# Patient Record
Sex: Male | Born: 1935 | Hispanic: No | Marital: Married | Smoking: Never smoker
Health system: Southern US, Community
[De-identification: ages and names within clinical notes are randomized; demographics above are authoritative.]

## PROBLEM LIST (undated history)

## (undated) DIAGNOSIS — I10 Essential (primary) hypertension: Secondary | ICD-10-CM

## (undated) DIAGNOSIS — E785 Hyperlipidemia, unspecified: Secondary | ICD-10-CM

## (undated) DIAGNOSIS — F329 Major depressive disorder, single episode, unspecified: Secondary | ICD-10-CM

## (undated) DIAGNOSIS — I2510 Atherosclerotic heart disease of native coronary artery without angina pectoris: Secondary | ICD-10-CM

## (undated) DIAGNOSIS — F32A Depression, unspecified: Secondary | ICD-10-CM

## (undated) DIAGNOSIS — G3184 Mild cognitive impairment, so stated: Secondary | ICD-10-CM

## (undated) DIAGNOSIS — F419 Anxiety disorder, unspecified: Secondary | ICD-10-CM

## (undated) DIAGNOSIS — H3530 Unspecified macular degeneration: Secondary | ICD-10-CM

## (undated) HISTORY — DX: Atherosclerotic heart disease of native coronary artery without angina pectoris: I25.10

## (undated) HISTORY — DX: Hyperlipidemia, unspecified: E78.5

## (undated) HISTORY — DX: Essential (primary) hypertension: I10

## (undated) HISTORY — DX: Anxiety disorder, unspecified: F41.9

## (undated) HISTORY — PX: APPENDECTOMY: SHX54

## (undated) HISTORY — DX: Depression, unspecified: F32.A

## (undated) HISTORY — DX: Major depressive disorder, single episode, unspecified: F32.9

## (undated) HISTORY — DX: Mild cognitive impairment, so stated: G31.84

## (undated) HISTORY — PX: TONSILLECTOMY: SHX5217

## (undated) HISTORY — DX: Unspecified macular degeneration: H35.30

## (undated) HISTORY — PX: CHOLECYSTECTOMY: SHX55

---

## 2007-11-19 ENCOUNTER — Encounter: Payer: Self-pay | Admitting: Cardiology

## 2009-03-16 ENCOUNTER — Encounter: Payer: Self-pay | Admitting: Cardiology

## 2009-03-24 ENCOUNTER — Encounter: Payer: Self-pay | Admitting: Cardiology

## 2009-05-28 ENCOUNTER — Ambulatory Visit: Payer: Self-pay | Admitting: Family Medicine

## 2009-05-28 ENCOUNTER — Encounter: Admission: RE | Admit: 2009-05-28 | Discharge: 2009-05-28 | Payer: Self-pay | Admitting: Family Medicine

## 2009-05-28 DIAGNOSIS — M7500 Adhesive capsulitis of unspecified shoulder: Secondary | ICD-10-CM | POA: Insufficient documentation

## 2009-05-28 DIAGNOSIS — E663 Overweight: Secondary | ICD-10-CM | POA: Insufficient documentation

## 2009-05-28 DIAGNOSIS — I10 Essential (primary) hypertension: Secondary | ICD-10-CM

## 2009-05-28 DIAGNOSIS — I252 Old myocardial infarction: Secondary | ICD-10-CM

## 2009-05-28 DIAGNOSIS — I2510 Atherosclerotic heart disease of native coronary artery without angina pectoris: Secondary | ICD-10-CM

## 2009-05-28 DIAGNOSIS — H3530 Unspecified macular degeneration: Secondary | ICD-10-CM | POA: Insufficient documentation

## 2009-05-28 DIAGNOSIS — F329 Major depressive disorder, single episode, unspecified: Secondary | ICD-10-CM

## 2009-05-28 DIAGNOSIS — E785 Hyperlipidemia, unspecified: Secondary | ICD-10-CM | POA: Insufficient documentation

## 2009-06-01 ENCOUNTER — Telehealth (INDEPENDENT_AMBULATORY_CARE_PROVIDER_SITE_OTHER): Payer: Self-pay | Admitting: *Deleted

## 2009-06-04 ENCOUNTER — Encounter: Payer: Self-pay | Admitting: Family Medicine

## 2009-06-08 ENCOUNTER — Encounter: Payer: Self-pay | Admitting: Family

## 2009-06-08 ENCOUNTER — Telehealth (INDEPENDENT_AMBULATORY_CARE_PROVIDER_SITE_OTHER): Payer: Self-pay | Admitting: *Deleted

## 2009-06-08 ENCOUNTER — Ambulatory Visit: Payer: Self-pay | Admitting: Family Medicine

## 2009-06-08 ENCOUNTER — Telehealth: Payer: Self-pay | Admitting: Family Medicine

## 2009-06-08 ENCOUNTER — Encounter: Payer: Self-pay | Admitting: Cardiology

## 2009-06-08 DIAGNOSIS — R209 Unspecified disturbances of skin sensation: Secondary | ICD-10-CM

## 2009-06-09 ENCOUNTER — Encounter: Payer: Self-pay | Admitting: Cardiology

## 2009-06-09 ENCOUNTER — Encounter: Payer: Self-pay | Admitting: Family Medicine

## 2009-06-09 ENCOUNTER — Encounter: Payer: Self-pay | Admitting: Family

## 2009-06-10 ENCOUNTER — Encounter: Payer: Self-pay | Admitting: Cardiology

## 2009-06-11 ENCOUNTER — Encounter: Payer: Self-pay | Admitting: Family

## 2009-06-11 ENCOUNTER — Encounter: Payer: Self-pay | Admitting: Family Medicine

## 2009-06-11 ENCOUNTER — Encounter: Payer: Self-pay | Admitting: Cardiology

## 2009-06-15 ENCOUNTER — Encounter: Payer: Self-pay | Admitting: Cardiology

## 2009-06-15 ENCOUNTER — Ambulatory Visit: Payer: Self-pay | Admitting: Cardiology

## 2009-06-15 DIAGNOSIS — I6350 Cerebral infarction due to unspecified occlusion or stenosis of unspecified cerebral artery: Secondary | ICD-10-CM | POA: Insufficient documentation

## 2009-06-18 ENCOUNTER — Telehealth (INDEPENDENT_AMBULATORY_CARE_PROVIDER_SITE_OTHER): Payer: Self-pay | Admitting: *Deleted

## 2009-06-24 ENCOUNTER — Ambulatory Visit: Payer: Self-pay | Admitting: Family

## 2009-06-24 DIAGNOSIS — D696 Thrombocytopenia, unspecified: Secondary | ICD-10-CM

## 2009-06-25 ENCOUNTER — Encounter: Payer: Self-pay | Admitting: Family Medicine

## 2009-06-25 LAB — CONVERTED CEMR LAB: Chloride: 103 meq/L (ref 96–112)

## 2009-07-01 ENCOUNTER — Telehealth (INDEPENDENT_AMBULATORY_CARE_PROVIDER_SITE_OTHER): Payer: Self-pay | Admitting: *Deleted

## 2009-07-02 ENCOUNTER — Telehealth (INDEPENDENT_AMBULATORY_CARE_PROVIDER_SITE_OTHER): Payer: Self-pay | Admitting: *Deleted

## 2009-07-02 ENCOUNTER — Ambulatory Visit: Payer: Self-pay | Admitting: Family Medicine

## 2009-07-02 LAB — CONVERTED CEMR LAB
HCT: 40.9 % (ref 39.0–52.0)
Hemoglobin: 14.6 g/dL (ref 13.0–17.0)
Lymphocytes Relative: 27 % (ref 12–46)
Lymphs Abs: 1.7 10*3/uL (ref 0.7–4.0)
MCHC: 35.7 g/dL (ref 30.0–36.0)
MCV: 91.1 fL (ref 78.0–100.0)
Neutro Abs: 4.2 10*3/uL (ref 1.7–7.7)
Neutrophils Relative %: 67 % (ref 43–77)
Platelets: 163 10*3/uL (ref 150–400)
RDW: 13.9 % (ref 11.5–15.5)

## 2009-07-09 ENCOUNTER — Ambulatory Visit: Payer: Self-pay | Admitting: Family Medicine

## 2009-07-09 LAB — CONVERTED CEMR LAB: INR: 1.9

## 2009-07-15 ENCOUNTER — Ambulatory Visit: Payer: Self-pay | Admitting: Family Medicine

## 2009-07-29 ENCOUNTER — Ambulatory Visit: Payer: Self-pay | Admitting: Family Medicine

## 2009-07-29 DIAGNOSIS — E538 Deficiency of other specified B group vitamins: Secondary | ICD-10-CM

## 2009-07-29 DIAGNOSIS — R197 Diarrhea, unspecified: Secondary | ICD-10-CM | POA: Insufficient documentation

## 2009-07-29 LAB — CONVERTED CEMR LAB: INR: 3.3

## 2009-07-30 ENCOUNTER — Encounter: Payer: Self-pay | Admitting: Family Medicine

## 2009-08-02 LAB — CONVERTED CEMR LAB
ALT: 20 units/L (ref 0–53)
AST: 22 units/L (ref 0–37)
Alkaline Phosphatase: 88 units/L (ref 39–117)
BUN: 16 mg/dL (ref 6–23)
Calcium: 8.3 mg/dL — ABNORMAL LOW (ref 8.4–10.5)
Direct LDL: 88 mg/dL
Eosinophils Relative: 2 % (ref 0–5)
Glucose, Bld: 128 mg/dL — ABNORMAL HIGH (ref 70–99)
Lymphocytes Relative: 25 % (ref 12–46)
Lymphs Abs: 1.4 10*3/uL (ref 0.7–4.0)
MCHC: 34 g/dL (ref 30.0–36.0)
Neutro Abs: 3.5 10*3/uL (ref 1.7–7.7)
Neutrophils Relative %: 64 % (ref 43–77)
RBC: 4.6 M/uL (ref 4.22–5.81)
Sodium: 143 meq/L (ref 135–145)
Total Protein: 6.5 g/dL (ref 6.0–8.3)
Vitamin B-12: 448 pg/mL (ref 211–911)

## 2009-08-03 ENCOUNTER — Encounter: Payer: Self-pay | Admitting: Family Medicine

## 2009-08-12 ENCOUNTER — Ambulatory Visit: Payer: Self-pay | Admitting: Family Medicine

## 2009-08-26 ENCOUNTER — Ambulatory Visit: Payer: Self-pay | Admitting: Family Medicine

## 2009-09-15 ENCOUNTER — Encounter: Payer: Self-pay | Admitting: Family Medicine

## 2009-09-16 ENCOUNTER — Ambulatory Visit: Payer: Self-pay | Admitting: Family Medicine

## 2009-09-17 ENCOUNTER — Encounter: Payer: Self-pay | Admitting: Family Medicine

## 2009-09-17 DIAGNOSIS — Q120 Congenital cataract: Secondary | ICD-10-CM

## 2009-09-17 DIAGNOSIS — H35039 Hypertensive retinopathy, unspecified eye: Secondary | ICD-10-CM | POA: Insufficient documentation

## 2009-09-17 DIAGNOSIS — H348392 Tributary (branch) retinal vein occlusion, unspecified eye, stable: Secondary | ICD-10-CM

## 2009-09-27 ENCOUNTER — Encounter: Payer: Self-pay | Admitting: Family Medicine

## 2009-10-05 ENCOUNTER — Encounter: Payer: Self-pay | Admitting: Family Medicine

## 2009-10-11 ENCOUNTER — Telehealth (INDEPENDENT_AMBULATORY_CARE_PROVIDER_SITE_OTHER): Payer: Self-pay | Admitting: *Deleted

## 2009-10-22 ENCOUNTER — Ambulatory Visit: Payer: Self-pay | Admitting: Family Medicine

## 2009-10-22 LAB — CONVERTED CEMR LAB: INR: 1.1

## 2009-10-25 ENCOUNTER — Ambulatory Visit: Payer: Self-pay | Admitting: Family Medicine

## 2009-11-09 ENCOUNTER — Ambulatory Visit: Payer: Self-pay | Admitting: Family Medicine

## 2009-11-09 LAB — CONVERTED CEMR LAB: INR: 1.5

## 2009-11-23 ENCOUNTER — Ambulatory Visit: Payer: Self-pay | Admitting: Family Medicine

## 2009-12-07 ENCOUNTER — Ambulatory Visit: Payer: Self-pay | Admitting: Family Medicine

## 2009-12-07 LAB — CONVERTED CEMR LAB: INR: 2

## 2010-01-06 ENCOUNTER — Ambulatory Visit: Admit: 2010-01-06 | Payer: Self-pay | Admitting: Family Medicine

## 2010-02-01 NOTE — Letter (Signed)
Summary: [facility] Office Visit Note   [facility] Office Visit Note   Imported By: Roderic Ovens 07/15/2009 15:24:19  _____________________________________________________________________  External Attachment:    Type:   Image     Comment:   External Document

## 2010-02-01 NOTE — Assessment & Plan Note (Signed)
Summary: PT and BP check, wants ears cleaned out- jr   Vital Signs:  Patient profile:   77 year old male Height:      66 inches Weight:      206 pounds BMI:     33.37 O2 Sat:      97 % on Room air Pulse rate:   50 / minute BP sitting:   137 / 62  (left arm) Cuff size:   large  Vitals Entered By: Payton Spark CMA (August 12, 2009 10:25 AM)  O2 Flow:  Room air CC: Ear irrigation, INR and BP f/u   CC:  Ear irrigation and INR and BP f/u.  Anticoagulation Management History:      The patient is on coumadin and comes in today for a routine follow up visit.  He is being anticoagulated due to stroke prevention.  Plans are to keep the patient on coumadin for life.  His last INR was 3.3 and today's INR is 1.9.    Current Medications (verified): 1)  Cozaar 100 Mg Tabs (Losartan Potassium) .... Take 1 Tab By Mouth Once Daily 2)  Isosorbide Dinitrate 30 Mg Tabs (Isosorbide Dinitrate) .... Take 1 Tab By Mouth Once Daily 3)  Vit B12 Inj .... Once Monthly 4)  Lexapro 20 Mg Tabs (Escitalopram Oxalate) .... Take 1 Tab By Mouth Two Times A Day 5)  Folic Acid 1 Mg Tabs (Folic Acid) .... Take 1 Tab By Mouth Once Daily 6)  Hydrochlorothiazide 25 Mg Tabs (Hydrochlorothiazide) .... Take One Half  Tablet By Mouth Daily. 7)  Potassium Chloride Cr 10 Meq Cr-Caps (Potassium Chloride) .... Take One Tablet By Mouth Daily 8)  Pravastatin Sodium 40 Mg Tabs (Pravastatin Sodium) .... Take One Tablet By Mouth Daily At Bedtime 9)  Metoprolol Succinate 25 Mg Xr24h-Tab (Metoprolol Succinate) .[name].. 1 Tab By Mouth Daily 10)  Coumadin 5 Mg Tabs (Warfarin Sodium) .... Sunday - 7.5 Mg, Monday - 5 Mg, Tuesday - 7.5 Mg, Wednesday - 7.5 Mg, Thursday - 7.5 Mg, Friday - 5 Mg, Saturday - 7.5 Mg 11)  Coumadin 1 Mg Tabs (Warfarin Sodium) .... Sunday - 7.5 Mg, Monday - 5 Mg, Tuesday - 7.5 Mg, Wednesday - 7.5 Mg, Thursday - 7.5 Mg, Friday - 5 Mg, Saturday - 7.5 Mg  Allergies (verified): No Known Drug Allergies   Complete  Medication List: 1)  Cozaar 100 Mg Tabs (Losartan potassium) .... Take 1 tab by mouth once daily 2)  Isosorbide Dinitrate 30 Mg Tabs (Isosorbide dinitrate) .... Take 1 tab by mouth once daily 3)  Vit B12 Inj  .... Once monthly 4)  Lexapro 20 Mg Tabs (Escitalopram oxalate) .... Take 1 tab by mouth two times a day 5)  Folic Acid 1 Mg Tabs (Folic acid) .... Take 1 tab by mouth once daily 6)  Hydrochlorothiazide 25 Mg Tabs (Hydrochlorothiazide) .... Take one half  tablet by mouth daily. 7)  Potassium Chloride Cr 10 Meq Cr-caps (Potassium chloride) .... Take one tablet by mouth daily 8)  Pravastatin Sodium 40 Mg Tabs (Pravastatin sodium) .... Take one tablet by mouth daily at bedtime 9)  Metoprolol Succinate 25 Mg Xr24h-tab (Metoprolol succinate) .[name].. 1 tab by mouth daily 10)  Coumadin 5 Mg Tabs (Warfarin sodium) .... Sunday - 7.5 mg, monday - 6 mg, tuesday - 7.5 mg, wednesday - 7.5 mg, thursday - 7.5 mg, friday - 6 mg, saturday - 7.5 mg 11)  Coumadin 1 Mg Tabs (Warfarin sodium) .... Sunday - 7.5 mg, monday -  6 mg, tuesday - 7.5 mg, wednesday - 7.5 mg, thursday - 7.5 mg, friday - 6 mg, saturday - 7.5 mg  Other Orders: Fingerstick (19147) Protime (82956OZ)  Anticoagulation Management Assessment/Plan:      The target INR is 2.0-3.0.  He is to have a PT/INR in 2 weeks.  Anticoagulation instructions were given to patient.         Current Anticoagulation Instructions: The patient's dosage of coumadin will be increased.  The new dosage includes:   Coumadin 5 mg tabs and Coumadin 1 mg tabs:  Sunday - 7.5 mg, Monday - 6 mg, Tuesday - 7.5 mg, Wednesday - 7.5 mg, Thursday - 7.5 mg, Friday - 6 mg, Saturday - 7.5 mg.    Repeat PT/INR in 2 weeks.    Laboratory Results   Blood Tests      INR: 1.9   (Normal Range: 0.88-1.12   Therap INR: 2.0-3.5)

## 2010-02-01 NOTE — Assessment & Plan Note (Signed)
Summary: PT check - jr  Nurse Visit   Allergies: No Known Drug Allergies Laboratory Results   Blood Tests   Date/Time Received: 10/22/2009 Date/Time Reported: 10/22/2009   INR: 1.1   (Normal Range: 0.88-1.12   Therap INR: 2.0-3.5)    Orders Added: 1)  Fingerstick [36416] 2)  Protime [[id number]]   Anticoagulation Management History:      The patient is on coumadin and comes in today for a routine follow up visit.  He is being anticoagulated because of stroke prevention.  Plans are to keep the patient on coumadin for life.  His last INR was 2.2 and today's INR is 1.1.    Anticoagulation Management Assessment/Plan:      The target INR is 2.0-3.0.  He is to have a PT/INR in 2 weeks.  Anticoagulation instructions were given to patient.         Current Anticoagulation Instructions: The patient's dosage of coumadin will be increased.  The new dosage includes:   Coumadin 7.5 mg tabs:  Sunday - 7.5 mg, Monday - 7.5 mg, Tuesday - 7.5 mg, Wednesday - 7.5 mg, Thursday - 7.5 mg, Friday - 7.5 mg, Saturday - 7.5 mg.    Repeat PT/INR in 2 weeks.

## 2010-02-01 NOTE — Assessment & Plan Note (Signed)
Summary: NOV adhesive capsulitis/ meds   Vital Signs:  Patient profile:   77 year old male Height:      66 inches Weight:      209 pounds BMI:     33.86 O2 Sat:      97 % on Room air Pulse rate:   66 / minute BP sitting:   182 / 84  (left arm) Cuff size:   regular  Vitals Entered By: Payton Spark CMA (May 28, 2009 10:33 AM)  O2 Flow:  Room air CC: New to est. C/O R arm pain- did have steroid inj x 2 weeks ago but pain is now worse and has less ROM. Also c/o elevated BP x months.    Primary Care Provider:  Seymour Bars DO  CC:  New to est. C/O R arm pain- did have steroid inj x 2 weeks ago but pain is now worse and has less ROM. Also c/o elevated BP x months. .  History of Present Illness: 77 yo WM presents for NOV.  He and his wife just moved here from [place] this wk.  Their daughter is here - Sports administrator.  He has hx of CAD, AMI with 4 stents in his heart, last one placed in 09.  He has HTN, anxiety, depression, high cholesterol.  He saw his doctor in [place] 2 wks ago for 1 month of non traumatic R shoulder pain.  Had an anterior and posterior steroid injection which did help his pain but he has noticed in the last 2 wks, he has lost ROM.  He is R handed and is now havingproblems unpacking boxes, washing his hair and extending R arm overhead.  he does not endorse much pain. Taking Tylenol.  Denies previous R shoulder problems or surgeries.  Pain radiates to the elbow with weakness but no neck pain and no paresthesias.  Also, his wife states that Rohnan's meds are on the moving truck, which is en route to [place] so he has been w/o his meds for a few days now.  Current Medications (verified): 1)  Cozaar 100 Mg Tabs (Losartan Potassium) .... Take 1 Tab By Mouth Once Daily 2)  Isosorbide Dinitrate 30 Mg Tabs (Isosorbide Dinitrate) .... Take 1 Tab By Mouth Once Daily 3)  Toprol Xl 50 Mg Xr24h-Tab (Metoprolol Succinate) .... Take 1 Tab By Mouth Once Daily 4)  Vit B12 Inj .... Once  Monthly 5)  Lexapro 20 Mg Tabs (Escitalopram Oxalate) .... Take 1 Tab By Mouth Two Times A Day 6)  Folic Acid 1 Mg Tabs (Folic Acid) .... Take 1 Tab By Mouth Once Daily 7)  Alprazolam 0.25 Mg Tabs (Alprazolam) .... Take 1 Tab By Mouth At Bedtime 8)  Lasix .... As Needed 9)  Potassium .... As Needed  Allergies (verified): No Known Drug Allergies  Past History:  Past Medical History: CAD (with stenting x 4 in [place]) HTN depression/ anxiety Hyperlipidemia  Past Surgical History: Appendectomy Cholecystectomy PTCA/stent Tonsillectomy  Family History: Mother died at 59, CVA, CAD, HTN, high chol, DM father died at 11 - hit by a car sister died at 74, brain tumor brother died at 16, CVA  Social History: Retired Theme park manager. Married to [place].   Has 3 grown children.  Daughter - Roosvelt Harps is here, 2 others in [place].  Has 12 grandkids. No exercise. Never smoked. 2 ETOH/ DAY.  Review of Systems       no fevers/sweats/weakness, unexplained wt loss/gain, + change in vision, no difficulty  hearing, ringing in ears, no hay fever/allergies,+ CP/discomfort, no palpitations, no breast lump/nipple discharge, no cough/wheeze, no blood in stool,no  N/V/D, no nocturia, no leaking urine, no unusual vag bleeding, no vaginal/penile discharge, + muscle/joint pain, no rash, no new/changing mole, no HA, + memory loss, no anxiety, no sleep problem, + depression, no unexplained lumps, no easy bruising/bleeding, no concern with sexual function   Physical Exam  General:  obese WM here with wife hard of hearing Head:  normocephalic, atraumatic, and male-pattern balding.   Eyes:  pupils equal, pupils round, and pupils reactive to light.   Ears:  no external deformities.   Nose:  no nasal discharge.   Mouth:  pharynx pink and moist.   Neck:  no masses.   Lungs:  normal respiratory effort, no intercostal retractions, no accessory muscle use, and normal breath sounds.   Heart:  RRR with 2/6 systolic  murmur at the LLSB Msk:  crepitus over the R glenohumeral joint with pain and limited active flexion to 90 deg.  full adduction.  Limited with full int/ ext rotation and limited abduction  full C spine ROM grip + 5/5 bilat Pulses:  2+ radial pulses Extremities:  varicose veins bilat LEs Skin:  fleshy lipoma posterior L knee Psych:  good eye contact and flat affect.     Impression & Recommendations:  Problem # 1:  ADHESIVE CAPSULITIS, RIGHT (ICD-726.0) Assessment Deteriorated Poor R glenohumeral flexion to 90 deg only.  Will get him into PT to try to improve ROM.  He may need stronger pain meds and really cannot take NSAIDs due to ASA use and CAD hx.     Orders: Physical Therapy Referral (PT) T-DG Shoulder*R* ([id number])  Problem # 2:  ESSENTIAL HYPERTENSION, BENIGN (ICD-401.1) Assessment: Deteriorated  Restart meds. Will need to add Caduet back on board at 4 wk f/u. His updated medication list for this problem includes:    Cozaar 100 Mg Tabs (Losartan potassium) .[name]... Take 1 tab by mouth once daily    Toprol Xl 50 Mg Xr24h-tab (Metoprolol succinate) .[name]... Take 1 tab by mouth once daily  BP today: 182/84  Orders: Prescription Created Electronically [phone])  Problem # 3:  CAD (ICD-414.00) Hx of non - adherence with meds and has been having anginal chest pains for years.   I RFd his isosorbide today in additon to his BP meds.  Will get his old records and I will go ahead and refer him to Dr Jens Som to est care here.  His last stent was placed in 09. His updated medication list for this problem includes:    Cozaar 100 Mg Tabs (Losartan potassium) .[name]... Take 1 tab by mouth once daily    Isosorbide Dinitrate 30 Mg Tabs (Isosorbide dinitrate) .[name]... Take 1 tab by mouth once daily    Toprol Xl 50 Mg Xr24h-tab (Metoprolol succinate) .[name]... Take 1 tab by mouth once daily  Orders: Cardiology Referral (Cardiology)  Problem # 4:  DEPRESSION (ICD-311) RFd his meds.  His wife reports that  the move has him overwhelmed and emotional.  He has also been w/o his meds for almost a wk now. His updated medication list for this problem includes:    Lexapro 20 Mg Tabs (Escitalopram oxalate) .[name]... Take 1 tab by mouth two times a day    Alprazolam 0.25 Mg Tabs (Alprazolam) .[name]... Take 1 tab by mouth at bedtime  Problem # 5:  MACULAR DEGENERATION (ICD-362.50) Will refer to [facility] for f/u. Orders: Ophthalmology Referral (  Ophthalmology)  Complete Medication List: 1)  Cozaar 100 Mg Tabs (Losartan potassium) .... Take 1 tab by mouth once daily 2)  Isosorbide Dinitrate 30 Mg Tabs (Isosorbide dinitrate) .... Take 1 tab by mouth once daily 3)  Toprol Xl 50 Mg Xr24h-tab (Metoprolol succinate) .... Take 1 tab by mouth once daily 4)  Vit B12 Inj  .... Once monthly 5)  Lexapro 20 Mg Tabs (Escitalopram oxalate) .... Take 1 tab by mouth two times a day 6)  Folic Acid 1 Mg Tabs (Folic acid) .... Take 1 tab by mouth once daily 7)  Alprazolam 0.25 Mg Tabs (Alprazolam) .... Take 1 tab by mouth at bedtime 8)  Lasix  .... As needed 9)  Potassium  .... As needed 10)  Caduet *unsure of Dose  .... Take 1 tab by mouth once daily  Patient Instructions: 1)  Have R shoulder Xray done downstairs. 2)  Will call you w/ results this afternoon. 3)  Set up for PT. 4)  Use Tylenol 2 tabs 3 x a day as needed for pain. 5)  Restart BP/ heart meds! 6)  Return for f/u BP / shoulder pain in 3-4 wks. Prescriptions: ALPRAZOLAM 0.25 MG TABS (ALPRAZOLAM) Take 1 tab by mouth at bedtime  #30 x 0   Entered and Authorized by:   Seymour Bars DO   Signed by:   Seymour Bars DO on 05/28/2009   Method used:   Printed then faxed to ...       [address] [phone]* (retail)       [address] [place], [place]  [address]       Ph: [phone]       Fax: [phone]   RxID:   [id number] LEXAPRO 20 MG TABS (ESCITALOPRAM OXALATE) Take 1 tab by mouth two times a day  #30 x 2   Entered and Authorized by:    Seymour Bars DO   Signed by:   Seymour Bars DO on 05/28/2009   Method used:   Printed then faxed to ...       [address] [phone]* (retail)       [address] [place], [place]  [address]       Ph: [phone]       Fax: [phone]   RxID:   [id number] TOPROL XL 50 MG XR24H-TAB (METOPROLOL SUCCINATE) Take 1 tab by mouth once daily  #30 x 2   Entered and Authorized by:   Seymour Bars DO   Signed by:   Seymour Bars DO on 05/28/2009   Method used:   Electronically to        [facility] [phone]* (retail)       [address] [place], [place]  [address]       Ph: [phone]       Fax: [phone]   RxID:   [id number] ISOSORBIDE DINITRATE 30 MG TABS (ISOSORBIDE DINITRATE) Take 1 tab by mouth once daily  #30 x 2   Entered and Authorized by:   Seymour Bars DO   Signed by:   Seymour Bars DO on 05/28/2009   Method used:   Electronically to        [facility] [phone]* (retail)       [address] [place], [place]  [address]  Ph: [phone]       Fax: [phone]   RxID:   [id number] COZAAR 100 MG TABS (LOSARTAN POTASSIUM) Take 1 tab by mouth once daily  #30 x 2   Entered and Authorized by:   Seymour Bars DO   Signed by:   Seymour Bars DO on 05/28/2009   Method used:   Electronically to        [facility] [phone]* (retail)       [address] [place], [place]  [address]       Ph: [phone]       Fax: [phone]   RxID:   [id number]

## 2010-02-01 NOTE — Progress Notes (Signed)
Summary: Scheduled pt as Dr.Bowen instructed  Phone Note Outgoing Call   Call placed by: Michaelle Copas, [facility]  Call placed to: Patient Summary of Call: Called and spoke to pt's wife and she is going to bring him in to see Dr.Bowen for a 1:00 Appt.Michaelle Copas  June 08, 2009 11:18 AM  Initial call taken by: Michaelle Copas,  June 08, 2009 11:18 AM

## 2010-02-01 NOTE — Letter (Signed)
Summary: Karena Addison MD Ophthalmology  Karena Addison MD Ophthalmology   Imported By: Lanelle Bal 10/27/2009 14:15:05  _____________________________________________________________________  External Attachment:    Type:   Image     Comment:   External Document

## 2010-02-01 NOTE — Consult Note (Signed)
Summary: [facility]  [facility]   Imported By: Lanelle Bal 06/11/2009 14:04:31  _____________________________________________________________________  External Attachment:    Type:   Image     Comment:   External Document

## 2010-02-01 NOTE — Letter (Signed)
Summary: [facility]  [facility]   Imported By: Lanelle Bal 07/02/2009 09:04:22  _____________________________________________________________________  External Attachment:    Type:   Image     Comment:   External Document

## 2010-02-01 NOTE — Letter (Signed)
Summary: [facility]  [facility]   Imported By: Lanelle Bal 07/07/2009 10:04:19  _____________________________________________________________________  External Attachment:    Type:   Image     Comment:   External Document

## 2010-02-01 NOTE — Miscellaneous (Signed)
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 Occured 10-2008.  Has recieved at least 5 Avastin injections since then.  Has cystic edema increased compared to 1 yr ago.  Referred to Dr Delton See.   Assessed HYPERTENSIVE RETINOPATHY as comment only - bilat.  mild to moderate with AV nicking and Arteriolar attenuation.   Hx of BRVO R eye.  Working on BP control.  Observations: Added new observation of PRIMARY MD: Seymour Bars DO (09/17/2009 13:42)       Impression & Recommendations:  Problem # 1:  BRANCH RETINAL VEIN OCCLUSION (ICD-362.36) Seeing Dr Pecola Leisure. Occured 10-2008.  Has recieved at least 5 Avastin injections since then.  Has cystic edema increased compared to 1 yr ago.  Referred to Dr Delton See.    Problem # 2:  HYPERTENSIVE RETINOPATHY (ICD-362.11) bilat.  mild to moderate with AV nicking and Arteriolar attenuation.   Hx of BRVO R eye.  Working on BP control.    Complete Medication List: 1)  Cozaar 100 Mg Tabs (Losartan potassium) .... Take 1 tab by mouth once daily 2)  Isosorbide Dinitrate 30 Mg Tabs (Isosorbide dinitrate) .... Take 1 tab by mouth once daily 3)  Vit B12 Inj  .... Once monthly 4)  Lexapro 20 Mg Tabs (Escitalopram oxalate) .... Take 1 tab by mouth two times a day 5)  Folic Acid 1 Mg Tabs (Folic acid) .... Take 1 tab by mouth once daily 6)  Hydrochlorothiazide 25 Mg Tabs (Hydrochlorothiazide) .... Take one half  tablet by mouth daily. 7)  Potassium Chloride Cr 10 Meq Cr-caps (Potassium chloride) .... Take one tablet by mouth daily 8)  Pravastatin Sodium 40 Mg Tabs (Pravastatin sodium) .... Take one tablet by mouth daily at bedtime 9)  Metoprolol Succinate 25 Mg Xr24h-tab (Metoprolol  succinate) .[name].. 1 tab by mouth daily 10)  Coumadin 6 Mg Tabs (Warfarin sodium) .... Sunday - 7.5 mg, monday - 6 mg, tuesday - 7.5 mg, wednesday - 7.5 mg, thursday - 7.5 mg, friday - 6 mg, saturday - 7.5 mg 11)  Coumadin 1 Mg Tabs (Warfarin sodium) .... Sunday - 7.5 mg, monday - 6 mg, tuesday - 7.5 mg, wednesday - 7.5 mg, thursday - 7.5 mg, friday - 6 mg, saturday - 7.5 mg

## 2010-02-01 NOTE — Consult Note (Signed)
Summary: [facility]   [facility]   Imported By: Roderic Ovens 08/17/2009 10:47:22  _____________________________________________________________________  External Attachment:    Type:   Image     Comment:   External Document

## 2010-02-01 NOTE — Miscellaneous (Signed)
Summary: [facility]  [facility]   Imported By: Lanelle Bal 08/24/2009 12:04:04  _____________________________________________________________________  External Attachment:    Type:   Image     Comment:   External Document

## 2010-02-01 NOTE — Assessment & Plan Note (Signed)
Summary: f/u CVA   Vital Signs:  Patient profile:   77 year old male Height:      66 inches Weight:      211 pounds BMI:     34.18 O2 Sat:      98 % on Room air Pulse rate:   55 / minute BP sitting:   161 / 74  (left arm) Cuff size:   regular  Vitals Entered By: Payton Spark CMA (July 29, 2009 10:35 AM)  O2 Flow:  Room air CC: F/U INR. Has tingling in R hand and face feels swollen around mouth since stroke   Primary Care Provider:  Seymour Bars DO  CC:  F/U INR. Has tingling in R hand and face feels swollen around mouth since stroke.  History of Present Illness: 76 yo WM presents for f/u from a CVA in June.  This was his preceeding note:   Carlos Harrington is a 77 year old male who presents today for follow up from his most recent hospitalization. 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 MRA of the brain revealed atherosclerotic disease which included occlusion of the left PCA and R MCA as well as basilar arter.  He was also noted to have mild glucose intolerance.  (A1C 6.1) During this hospitalization he was placed on coumadin therapy which according to the patient's wife was finally therapeutic yesterday at 2.3  His lovenox bridge was  discontinued.  In addition, HCTZ was added to his regimen due to uncontrolled BP while hospitalized.  Since returning home, he has been doing well.  The only residual effects that he has noticed is some tingling in the right hand.  Per wife patient has been more tired than usual- though this is slowly improving.    Since then, he is doing well on his coumadin but is having frequent loose stools.  He continues to have numbness with loss of dexterity in his R hand (he is R hand dominant) and tingling over the R side of his lips and cheeks but no problems swallowing or speaking.  He is taking 1/2 tab of HCTZ since last.   Anticoagulation Management History:      The patient is on coumadin and comes in  today for a routine follow up visit.  He is being anticoagulated due to stroke prevention.  Plans are to keep the patient on coumadin for life.  His last INR was 3.1 and today's INR is 3.3.    Current Medications (verified): 1)  Cozaar 100 Mg Tabs (Losartan Potassium) .... Take 1 Tab By Mouth Once Daily 2)  Isosorbide Dinitrate 30 Mg Tabs (Isosorbide Dinitrate) .... Take 1 Tab By Mouth Once Daily 3)  Vit B12 Inj .... Once Monthly 4)  Lexapro 20 Mg Tabs (Escitalopram Oxalate) .... Take 1 Tab By Mouth Two Times A Day 5)  Folic Acid 1 Mg Tabs (Folic Acid) .... Take 1 Tab By Mouth Once Daily 6)  Hydrochlorothiazide 25 Mg Tabs (Hydrochlorothiazide) .... Take One Half  Tablet By Mouth Daily. 7)  Potassium Chloride Cr 10 Meq Cr-Caps (Potassium Chloride) .... Take One Tablet By Mouth Daily 8)  Pravastatin Sodium 40 Mg Tabs (Pravastatin Sodium) .... Take One Tablet By Mouth Daily At Bedtime 9)  Metoprolol Succinate 25 Mg Xr24h-Tab (Metoprolol Succinate) .[name].. 1 Tab By Mouth Daily 10)  Coumadin 7.5 Mg Tabs (Warfarin Sodium) .... Sunday - 7.5 Mg, Monday - 7.5 Mg, Tuesday - 7.5  Mg, Wednesday - 7.5 Mg, Thursday - 7.5 Mg, Friday - 7.5 Mg, Saturday - 7.5 Mg  Allergies (verified): No Known Drug Allergies  Past History:  Past Medical History: CAD (with stenting x 4 in [place]) HTN depression/ anxiety Hyperlipidemia MACULAR DEGENERATION  CVA L thalamic/ L occiptal  Past Surgical History: Reviewed history from 06/15/2009 and no changes required. Appendectomy Cholecystectomy Tonsillectomy  Family History: Reviewed history from 06/15/2009 and no changes required. Mother died at 66, CVA,  HTN, high chol, DM father died at 48 - hit by a car sister died at 33, brain tumor brother died at 72, CVA  Social History: Reviewed history from 06/15/2009 and no changes required. 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   Has 3 grown children.  Daughter - Carlos Harrington is here, 2 others in [place].  Has 12  grandkids. No exercise. Never smoked. 2 ETOH/ DAy.  Review of Systems      See HPI  Physical Exam  General:  alert, well-developed, well-nourished, and well-hydrated.  obese Head:  normocephalic and atraumatic.   Eyes:  pupils equal, pupils round, and pupils reactive to light.   Ears:  R sided hearing aid Nose:  no nasal discharge.   Mouth:  good dentition and pharynx pink and moist.  able to swallow on command Neck:  no masses.  no audible carotid bruits Lungs:  Normal respiratory effort, chest expands symmetrically. Lungs are clear to auscultation, no crackles or wheezes. Heart:  Normal rate and regular rhythm. S1 and S2 normal without gallop, murmur, click, rub or other extra sounds. Abdomen:  soft and non-tender.   Pulses:  2+ radial pulses Extremities:  no LE edema Neurologic:  no tremor subjective numbness over the R thumb and 4th digit  cranial nerves II-XII intact.   Skin:  color normal.   Cervical Nodes:  No lymphadenopathy noted Psych:  Oriented X3, good eye contact, not anxious appearing, and not depressed appearing.     Impression & Recommendations:  Problem # 1:  CVA (ICD-434.91) Unchanged.  On coumadin, BB, ACEi, statin. INR 3.3 today.  Will adjust his dose down and recheck in 2 wks. Will set him up with neuro for ongoing weakness and high risk for recurrence.  REfer to OT to improve R hand dexterity. The following medications were removed from the medication list:    Coumadin 7.5 Mg Tabs (Warfarin sodium) ..... Sunday - 7.5 mg, monday - 7.5 mg, tuesday - 7.5 mg, wednesday - 7.5 mg, thursday - 7.5 mg, friday - 7.5 mg, saturday - 7.5 mg His updated medication list for this problem includes:    Coumadin 5 Mg Tabs (Warfarin sodium) ..... Sunday - 7.5 mg, monday - 5 mg, tuesday - 7.5 mg, wednesday - 7.5 mg, thursday - 7.5 mg, friday - 5 mg, saturday - 7.5 mg    Coumadin 1 Mg Tabs (Warfarin sodium) ..... Sunday - 7.5 mg, monday - 5 mg, tuesday - 7.5 mg, wednesday -  7.5 mg, thursday - 7.5 mg, friday - 5 mg, saturday - 7.5 mg  Orders: Neurology Referral (Neuro) Occupational Therapy (OT) T-Lipoprotein (LDL cholesterol)  ([id number])  Problem # 2:  ESSENTIAL HYPERTENSION, BENIGN (ICD-401.1) BP still high.  Will increase HCTZ back up to a full tab.  REcheck BMP at 3 mos f/u. His updated medication list for this problem includes:    Cozaar 100 Mg Tabs (Losartan potassium) .[name]... Take 1 tab by mouth once daily    Hydrochlorothiazide 25 Mg Tabs (Hydrochlorothiazide) .[name]... Take  one half  tablet by mouth daily.    Metoprolol Succinate 25 Mg Xr24h-tab (Metoprolol succinate) .[name]... 1 tab by mouth daily  BP today: 161/74 Prior BP: 143/60 (07/02/2009)  Labs Reviewed: K+: 3.7 (06/25/2009) Creat: : 1.30 (06/25/2009)     Problem # 3:  DIARRHEA (ICD-787.91) Possibly med- induced.  All of his meds are necessary though.  Will check some labs today.  may need stool studies and a colonoscopy if not resolving. Orders: T-Comprehensive Metabolic Panel [phone]) T-CBC w/Diff [id number]) T-TSH ([phone])  Problem # 4:  HYPERLIPIDEMIA (ICD-272.4) LDL goal with CAD and CVA is <70.  Will check this today. His updated medication list for this problem includes:    Pravastatin Sodium 40 Mg Tabs (Pravastatin sodium) .[name]... Take one tablet by mouth daily at bedtime  Problem # 5:  B12 DEFICIENCY (ICD-266.2) He had previously been on B12 injections and has not had a level checked since moving here.  Will check today. Orders: T-Vitamin B12 ([id number])  Problem # 6:  GLUCOSE INTOLERANCE (ICD-271.3) Assessment: New He has been given info on diabetic diet and remain in the IFG range.  Working on low carb diet, limited exercise. Recheck at 3 mos f/u visit.  Complete Medication List: 1)  Cozaar 100 Mg Tabs (Losartan potassium) .... Take 1 tab by mouth once daily 2)  Isosorbide Dinitrate 30 Mg Tabs (Isosorbide dinitrate) .... Take 1 tab by mouth once daily 3)  Vit  B12 Inj  .... Once monthly 4)  Lexapro 20 Mg Tabs (Escitalopram oxalate) .... Take 1 tab by mouth two times a day 5)  Folic Acid 1 Mg Tabs (Folic acid) .... Take 1 tab by mouth once daily 6)  Hydrochlorothiazide 25 Mg Tabs (Hydrochlorothiazide) .... Take one half  tablet by mouth daily. 7)  Potassium Chloride Cr 10 Meq Cr-caps (Potassium chloride) .... Take one tablet by mouth daily 8)  Pravastatin Sodium 40 Mg Tabs (Pravastatin sodium) .... Take one tablet by mouth daily at bedtime 9)  Metoprolol Succinate 25 Mg Xr24h-tab (Metoprolol succinate) .[name].. 1 tab by mouth daily 10)  Coumadin 5 Mg Tabs (Warfarin sodium) .... Sunday - 7.5 mg, monday - 5 mg, tuesday - 7.5 mg, wednesday - 7.5 mg, thursday - 7.5 mg, friday - 5 mg, saturday - 7.5 mg 11)  Coumadin 1 Mg Tabs (Warfarin sodium) .... Sunday - 7.5 mg, monday - 5 mg, tuesday - 7.5 mg, wednesday - 7.5 mg, thursday - 7.5 mg, friday - 5 mg, saturday - 7.5 mg  Anticoagulation Management Assessment/Plan:      The target INR is 2.0-3.0.  He is to have a PT/INR in 2 weeks.  Anticoagulation instructions were given to patient.         Current Anticoagulation Instructions: Hold dose tonight.  Coumadin 5 mg tabs and Coumadin 1 mg tabs:  Sunday - 7.5 mg, Monday - 5 mg, Tuesday - 7.5 mg, Wednesday - 7.5 mg, Thursday - 7.5 mg, Friday - 5 mg, Saturday - 7.5 mg.    Repeat PT/INR in 2 weeks.    Patient Instructions: 1)  Labs today downstairs. 2)  Will call you w/ results tomorrow. 3)  Adjust coumadin dose down. 4)  Recheck in 2 wks. 5)  Occ Health and Neurology referrals made. 6)  Return to recheck fasting sugar/ BP/ cholesterol in 3 mos.

## 2010-02-01 NOTE — Assessment & Plan Note (Signed)
Summary: coumadin check - jr  Nurse Visit   Vitals Entered By: Payton Spark CMA (September 16, 2009 10:16 AM)  Allergies: No Known Drug Allergies Laboratory Results   Blood Tests      INR: 2.2   (Normal Range: 0.88-1.12   Therap INR: 2.0-3.5)    Orders Added: 1)  Fingerstick [36416] 2)  Protime [[id number]]   Anticoagulation Management History:      The patient is on coumadin and comes in today for a routine follow up visit.  He is being anticoagulated due to stroke prevention.  Plans are to keep the patient on coumadin for life.  His last INR was 2.0 and today's INR is 2.2.    Anticoagulation Management Assessment/Plan:      The target INR is 2.0-3.0.  He is to have a PT/INR in 4 weeks.  Anticoagulation instructions were given to patient.         Current Anticoagulation Instructions: The patient is to continue with the same dose of coumadin.  This dosage includes: Coumadin 6 mg tabs and Coumadin 1 mg tabs:  Sunday - 7.5 mg, Monday - 6 mg, Tuesday - 7.5 mg, Wednesday - 7.5 mg, Thursday - 7.5 mg, Friday - 6 mg, Saturday - 7.5 mg.    Repeat PT/INR in 4 weeks.

## 2010-02-01 NOTE — Assessment & Plan Note (Signed)
Summary: nurse appt- jr  Nurse Visit   Vitals Entered By: Payton Spark CMA (July 15, 2009 11:05 AM)  Allergies: No Known Drug Allergies Laboratory Results   Blood Tests      INR: 3.1   (Normal Range: 0.88-1.12   Therap INR: 2.0-3.5)    Orders Added: 1)  Fingerstick [36416] 2)  Protime [[id number]]   Anticoagulation Management History:      The patient is on coumadin and comes in today for a routine follow up visit.  He is being anticoagulated due to stroke prevention.  Plans are to keep the patient on coumadin for life.  His last INR was 1.9 and today's INR is 3.1.    Anticoagulation Management Assessment/Plan:      The target INR is 2.0-3.0.  He is to have a PT/INR in 2 weeks.  Anticoagulation instructions were given to patient.         Current Anticoagulation Instructions: The patient's dosage of coumadin will be decreased.  The new dosage includes:   Coumadin 7.5 mg tabs:  Sunday - 7.5 mg, Monday - 7.5 mg, Tuesday - 7.5 mg, Wednesday - 7.5 mg, Thursday - 7.5 mg, Friday - 7.5 mg, Saturday - 7.5 mg.    Repeat PT/INR in 2 weeks.

## 2010-02-01 NOTE — Progress Notes (Signed)
  ROI faxed to Dr.Spannuolo @ [phone]/cb [phone] recieved records back today put on Gesila's desk. [facility]  July 01, 2009 10:12 AM

## 2010-02-01 NOTE — Assessment & Plan Note (Signed)
Summary: coumadin check - jr  Nurse Visit   Vitals Entered By: Payton Spark CMA (November 23, 2009 1:09 PM)  Allergies: No Known Drug Allergies Laboratory Results   Blood Tests      INR: 1.8   (Normal Range: 0.88-1.12   Therap INR: 2.0-3.5)    Orders Added: 1)  Fingerstick [36416] 2)  Protime [[id number]]   Anticoagulation Management History:      The patient is on coumadin and comes in today for a routine follow up visit.  Coumadin therapy is being given due to stroke prevention.  Plans are to keep the patient on coumadin for life.  His last INR was 1.5 and today's INR is 1.8.    Anticoagulation Management Assessment/Plan:      The target INR is 2.0-3.0.  He is to have a PT/INR in 2 weeks.  Anticoagulation instructions were given to patient.         Current Anticoagulation Instructions: The patient's dosage of coumadin will be increased.  The new dosage includes:   Coumadin 7.5 mg tabs and Coumadin 1 mg tabs:  Sunday - 8.5 mg, Monday - 8.5 mg, Tuesday - 7.5 mg, Wednesday - 8.5 mg, Thursday - 7.5 mg, Friday - 8.5 mg, Saturday - 8.5 mg.    Repeat PT/INR in 2 weeks.

## 2010-02-01 NOTE — Assessment & Plan Note (Signed)
Summary: HOSPITAL FU//VGJ   Vital Signs:  Patient profile:   77 year old male Height:      66 inches Weight:      206 pounds BMI:     33.37 O2 Sat:      95 % on Room air Pulse rate:   61 / minute BP sitting:   109 / 62  (left arm) Cuff size:   large  Vitals Entered By: Payton Spark CMA (June 24, 2009 2:57 PM)  O2 Flow:  Room air CC: Hosp F/U   Primary Care Provider:  Seymour Bars DO  CC:  Hosp F/U.  History of Present Illness: Carlos Harrington is a 77 year old male who presents today for follow up from his most recent hospitalization. He was hospitalized 6/7-6/10 at [facility] for a Left thalamic CVA and L occipital CVA. MRA of the brain revealed atherosclerotic disease which included occlusion of the left PCA and R MCA as well as basilar arter.  He was also noted to have mild glucose intolerance.  (A1C 6.1) During this hospitalization he was placed on coumadin therapy which according to the patient's wife was finally therapeutic yesterday at 2.3  His lovenox bridge was  discontinued.  In addition, HCTZ was added to his regimen due to uncontrolled BP while hospitalized.  Since returning home, he has been doing well.  The only residual effects that he has noticed is some tingling in the right hand.  Per wife patient has been more tired than usual- though this is slowly improving.    Current Medications (verified): 1)  Cozaar 100 Mg Tabs (Losartan Potassium) .... Take 1 Tab By Mouth Once Daily 2)  Isosorbide Dinitrate 30 Mg Tabs (Isosorbide Dinitrate) .... Take 1 Tab By Mouth Once Daily 3)  Vit B12 Inj .... Once Monthly 4)  Lexapro 20 Mg Tabs (Escitalopram Oxalate) .... Take 1 Tab By Mouth Two Times A Day 5)  Folic Acid 1 Mg Tabs (Folic Acid) .... Take 1 Tab By Mouth Once Daily 6)  Hydrochlorothiazide 25 Mg Tabs (Hydrochlorothiazide) .... Take One Tablet By Mouth Daily. 7)  Potassium Chloride Cr 10 Meq Cr-Caps (Potassium Chloride) .... Take One Tablet By Mouth Daily 8)   Warfarin Sodium 5 Mg Tabs (Warfarin Sodium) .... Use As Directed By Anticoagulation Clinic 9)  Pravastatin Sodium 40 Mg Tabs (Pravastatin Sodium) .... Take One Tablet By Mouth Daily At Bedtime 10)  Metoprolol Succinate 25 Mg Xr24h-Tab (Metoprolol Succinate) .... 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   Has 3 grown children.  Daughter - Ro[place]velt Harps is here, 2 others in [place].  Has 12 grandkids. No exercise. Never smoked. 2 ETOH/ DAy.  Review of Systems       + tingling in his right hand.  Denies facial paresthesias  Physical Exam  General:  Well-developed,well-nourished,in no acute distress; alert,appropriate and cooperative throughout examination Lungs:  Normal respiratory effort, chest expands symmetrically. Lungs are clear to auscultation, no crackles or  wheezes. Heart:  Normal rate and regular rhythm. S1 and S2 normal without gallop, murmur, click, rub or other extra sounds. Neurologic:  No cranial nerve deficits noted. Station and gait are normal.  Sensory, motor and coordinative functions appear intact except patient is somewhat hard of hearing   Impression & Recommendations:  Problem # 1:  CVA (ICD-434.91) Hospital records reviewed. Patient is now maintained on Warfarin. Clinically stable Family and patient wish to have coumadin monitored at [facility].  Plan follow up in 1 week for INR check, and 1 month f/u with Dr.  Cathey Endow.  Continue statin His updated medication list for this problem includes:    Warfarin Sodium 5 Mg Tabs (Warfarin sodium) ..... Use as directed by anticoagulation clinic  Problem # 2:  ESSENTIAL HYPERTENSION, BENIGN (ICD-401.1) Assessment: Comment Only This appears overtreated.  Will cut HCTZ done to 12.5 and plan to have his BP checked at his nurse visit next week. His updated medication list for this problem includes:    Cozaar 100 Mg Tabs (Losartan potassium) .[name]... Take 1 tab by mouth once daily    Hydrochlorothiazide 25 Mg Tabs (Hydrochlorothiazide) .[name]... Take one half  tablet by mouth daily.    Metoprolol Succinate 25 Mg Xr24h-tab (Metoprolol succinate) .[name]... Take one half  tablet by mouth daily  Orders: T-Basic Metabolic Panel [id number])  Problem # 3:  GLUCOSE INTOLERANCE (ICD-271.3) Assessment: New A1C in hospital was 6.1.  Patient was counseled on diabetic diet, weight loss and exercise.    Problem # 4:  THROMBOCYTOPENIA (ICD-287.5) Patient was noted to have Plt cout of 144 in hospital- will need to have CBC drawn next week when he comes in.  Should be better off of heparin. Orders: T-CBC w/Diff ([id number])  Complete Medication List: 1)  Cozaar 100 Mg Tabs (Losartan potassium) .... Take 1 tab by mouth once daily 2)  Isosorbide Dinitrate 30 Mg Tabs (Isosorbide dinitrate) .... Take 1 tab by mouth once daily 3)  Vit B12 Inj  .... Once monthly 4)  Lexapro 20 Mg Tabs (Escitalopram oxalate) .... Take 1 tab by mouth two times a day 5)  Folic Acid 1 Mg Tabs (Folic acid) .... Take 1 tab by mouth once daily 6)  Hydrochlorothiazide 25 Mg Tabs (Hydrochlorothiazide) .... Take one half  tablet by mouth daily. 7)  Potassium Chloride Cr 10 Meq Cr-caps (Potassium chloride) .... Take one tablet by mouth daily 8)  Warfarin Sodium 5 Mg Tabs (Warfarin sodium) .... Use as directed by anticoagulation clinic 9)  Pravastatin Sodium 40 Mg Tabs (Pravastatin sodium) .... Take one tablet by  mouth daily at bedtime 10)  Metoprolol Succinate 25 Mg Xr24h-tab (Metoprolol succinate) .... Take one half  tablet by mouth daily  Patient Instructions: 1)  Please cut your Hydrochlorothiazide in half.  2)  Please arrange a follow up appointment next Thursday for nurse visit and coumadin check and follow up of your blood pressure. 3)  Follow up with Dr. Linford Arnold in  month. 4)  Work hard on a diabetic diet, weight loss and exercise.

## 2010-02-01 NOTE — Assessment & Plan Note (Signed)
Summary: possible CVA   Vital Signs:  Patient profile:   77 year old male Height:      66 inches Weight:      211 pounds BMI:     34.18 O2 Sat:      96 % on Room air Temp:     98.3 degrees F oral Pulse rate:   69 / minute BP sitting:   177 / 81  (left arm) Cuff size:   large  Vitals Entered By: Payton Spark CMA (June 08, 2009 1:18 PM)  O2 Flow:  Room air CC: R side of mouth swollen and numb. R hand numb x this AM.    Primary Care Provider:  Seymour Bars DO  CC:  R side of mouth swollen and numb. R hand numb x this AM. .  History of Present Illness: 77 yo WM presents for swelling on the R side of his face that he woke up with this morning.  The R side of his face felt numb but he has not had problems with talking, swallowing, confusion or problems with movement of the R side of his face.  He has not had any recent eye or face injection and he is on no new meds.  He has risk factors for stroke including HTN, obesity, CAD and hypercholesterolemia..  The R hand also felt numb at the same time over the 1st and 2nd digit but no weakness or pain.  Denies any facial pain or HA.      Current Medications (verified): 1)  Cozaar 100 Mg Tabs (Losartan Potassium) .... Take 1 Tab By Mouth Once Daily 2)  Isosorbide Dinitrate 30 Mg Tabs (Isosorbide Dinitrate) .... Take 1 Tab By Mouth Once Daily 3)  Toprol Xl 50 Mg Xr24h-Tab (Metoprolol Succinate) .... Take 1 Tab By Mouth Once Daily 4)  Vit B12 Inj .... Once Monthly 5)  Lexapro 20 Mg Tabs (Escitalopram Oxalate) .... Take 1 Tab By Mouth Two Times A Day 6)  Folic Acid 1 Mg Tabs (Folic Acid) .... Take 1 Tab By Mouth Once Daily 7)  Alprazolam 0.25 Mg Tabs (Alprazolam) .... Take 1 Tab By Mouth At Bedtime 8)  Lasix .... As Needed 9)  Potassium .... As Needed 10)  Caduet *unsure of Dose .... Take 1 Tab By Mouth Once Daily  Allergies (verified): No Known Drug Allergies  Past History:  Past Medical History: Reviewed history from 05/28/2009 and  no changes required. CAD (with stenting x 4 in [place]) HTN depression/ anxiety Hyperlipidemia  Social History: Reviewed history from 05/28/2009 and no changes required. Retired [employer] manager. Married to [place].   Has 3 grown children.  Daughter - Roosvelt Harps is here, 2 others in [place].  Has 12 grandkids. No exercise. Never smoked. 2 ETOH/ DAY.  Review of Systems      See HPI  Physical Exam  General:  alert and well-developed.  obese, in NAD, here with wife Head:  normocephalic and atraumatic.   Eyes:  pupils equal, pupils round, and pupils reactive to light.   Nose:  no nasal discharge.   Mouth:  pharynx pink and moist.   Neck:  no audible carotid bruits Lungs:  normal respiratory effort, no intercostal retractions, no accessory muscle use, and normal breath sounds.   Heart:  RRR with 2/6 systolic murmur at the LLSB Extremities:  no LE edema Skin:  color normal.   Cervical Nodes:  No lymphadenopathy noted Psych:  Oriented X3, memory intact for recent and remote,  and not anxious appearing.     Detailed Neurologic Exam  Speech:    Speech is normal; fluent and spontaneous with normal comprehension Cognition:    The patient is oriented to person, place, and time; memory intact; language fluent; normal attention, concentration, and fund of knowledge Cranial Nerves:    The pupils are equal, round, and reactive to light. . confrontation. Extraocular movements are intact. Trigeminal sensation is intact and the muscles of mastication are normal. The face is symmetric. The palate elevates in the midline. Voice is normal. Shoulder shrug is normal. The tongue has normal motion without fasciculations.  Gait:    Heel-toe and tandem gait are normal.  Light Touch:    subjective numbness R face maxillary and lip distribution to light touch   Impression & Recommendations:  Problem # 1:  DISTURBANCE OF SKIN SENSATION (ICD-782.0) R sided facial numbness, acute onset.  The only abnormal  finding is subjective sensory disruption of the R facial nerve.  Normal motor function.  Unsure cause.  b/c he is high risk for CVA, will get an MRI brain to r/o thrombus. Orders: T-MRI Head w/o contrast (16109)  Problem # 2:  NUMBNESS, HAND (ICD-782.0) R hand numbness, radial nerve distribution, acute onset.  By hx, it really doesn't make sense that this started along with the facial symptoms.  Exam is normal today w/o weakness but he is high risk for CVA.  Will get MRI and f/u results today.   Orders: T-MRI Head w/o contrast (60454)  Problem # 3:  ESSENTIAL HYPERTENSION, BENIGN (ICD-401.1) Assessment: Deteriorated He did not take his meds today.  We dicusssed importance of adherence and risk for CVA.  He will take his meds when he gets home today. His updated medication list for this problem includes:    Cozaar 100 Mg Tabs (Losartan potassium) .[name]... Take 1 tab by mouth once daily    Toprol Xl 50 Mg Xr24h-tab (Metoprolol succinate) .[name]... Take 1 tab by mouth once daily  Orders: T-MRI Head w/o contrast (09811)  BP today: 177/81 Prior BP: 182/84 (05/28/2009)  Problem # 4:  HYPERLIPIDEMIA (ICD-272.4)  Complete Medication List: 1)  Cozaar 100 Mg Tabs (Losartan potassium) .... Take 1 tab by mouth once daily 2)  Isosorbide Dinitrate 30 Mg Tabs (Isosorbide dinitrate) .... Take 1 tab by mouth once daily 3)  Toprol Xl 50 Mg Xr24h-tab (Metoprolol succinate) .... Take 1 tab by mouth once daily 4)  Vit B12 Inj  .... Once monthly 5)  Lexapro 20 Mg Tabs (Escitalopram oxalate) .... Take 1 tab by mouth two times a day 6)  Folic Acid 1 Mg Tabs (Folic acid) .... Take 1 tab by mouth once daily 7)  Alprazolam 0.25 Mg Tabs (Alprazolam) .... Take 1 tab by mouth at bedtime 8)  Lasix  .... As needed 9)  Potassium  .... As needed 10)  Caduet *unsure of Dose  .... Take 1 tab by mouth once daily  Patient Instructions: 1)  MRI brain will [facility] in [place] today. 2)  Will call you w/ results  tomorrow. 3)  Be sure to take your BP meds.

## 2010-02-01 NOTE — Assessment & Plan Note (Signed)
Summary: P.t check  Nurse Visit   Vitals Entered By: Payton Spark CMA (August 26, 2009 1:35 PM)  Allergies: No Known Drug Allergies Laboratory Results   Blood Tests      INR: 2.0   (Normal Range: 0.88-1.12   Therap INR: 2.0-3.5)    Orders Added: 1)  Fingerstick [36416] 2)  Protime [72536UY]   Anticoagulation Management History:      The patient is on coumadin and comes in today for a routine follow up visit.  He is being anticoagulated due to stroke prevention.  Plans are to keep the patient on coumadin for life.  His last INR was 1.9 and today's INR is 2.0.    Anticoagulation Management Assessment/Plan:      The target INR is 2.0-3.0.  He is to have a PT/INR in 3 Chanler Mendonca.  Anticoagulation instructions were given to patient.         Current Anticoagulation Instructions: The patient is to continue with the same dose of coumadin.  This dosage includes: Coumadin 5 mg tabs and Coumadin 1 mg tabs:  Sunday - 7.5 mg, Monday - 6 mg, Tuesday - 7.5 mg, Wednesday - 7.5 mg, Thursday - 7.5 mg, Friday - 6 mg, Saturday - 7.5 mg.  Repeat PT/INR in 3 Dustyn Armbrister.

## 2010-02-01 NOTE — Assessment & Plan Note (Signed)
Summary: INR  Nurse Visit   Vitals Entered By: Payton Spark CMA (December 07, 2009 1:06 PM)  Allergies: No Known Drug Allergies Laboratory Results   Blood Tests      INR: 2.0   (Normal Range: 0.88-1.12   Therap INR: 2.0-3.5)    Orders Added: 1)  Fingerstick [36416] 2)  Protime [16109UE]   Anticoagulation Management History:      The patient is on coumadin and comes in today for a routine follow up visit.  Coumadin therapy is being given due to stroke prevention.  Plans are to keep the patient on coumadin for life.  His last INR was 1.8 and today's INR is 2.0.    Anticoagulation Management Assessment/Plan:      The target INR is 2.0-3.0.  He is to have a PT/INR in 4 weeks.  Anticoagulation instructions were given to patient.         Current Anticoagulation Instructions: The patient is to continue with the same dose of coumadin.  This dosage includes:   Coumadin 7.5 mg tabs and Coumadin 1 mg tabs:  Sunday - 8.5 mg, Monday - 8.5 mg, Tuesday - 7.5 mg, Wednesday - 8.5 mg, Thursday - 7.5 mg, Friday - 8.5 mg, Saturday - 8.5 mg.    Repeat PT/INR in 4 weeks.

## 2010-02-01 NOTE — Progress Notes (Signed)
Summary: Eye surgery and coumadin  Phone Note Call from Patient   Caller: Patient Summary of Call: Nurse from eye doctor called and requested Pt come off of coumadin prior to eye surgery. Please advise. Initial call taken by: Payton Spark CMA,  October 11, 2009 4:26 PM  Follow-up for Phone Call        will they be OK if we bridge him with heparin injections? Follow-up by: Seymour Bars DO,  October 11, 2009 4:57 PM  Additional Follow-up for Phone Call Additional follow up Details #1::        Waiting for eye surgeon to CB.   Additional Follow-up by: Payton Spark CMA,  October 12, 2009 9:54 AM    Additional Follow-up for Phone Call Additional follow up Details #2::    [facility] for a return call. Closing note for now. Follow-up by: Payton Spark CMA,  October 14, 2009 1:49 PM

## 2010-02-01 NOTE — Letter (Signed)
Summary: [facility] No [facility]  Patient No Show/[facility]   Imported By: Lanelle Bal 10/22/2009 16:17:38  _____________________________________________________________________  External Attachment:    Type:   Image     Comment:   External Document

## 2010-02-01 NOTE — Progress Notes (Signed)
  ROI faxed to [facility], Recieved Records back gave to [facility]  June 18, 2009 9:30 AM

## 2010-02-01 NOTE — Assessment & Plan Note (Signed)
Summary: flu shot - jr  Nurse Visit   Vitals Entered By: Payton Spark CMA (October 25, 2009 11:37 AM)  Allergies: No Known Drug Allergies  Orders Added: 1)  Flu Vaccine 33yrs + MEDICARE PATIENTS [Q2039] 2)  Administration Flu vaccine - MCR [G0008] Flu Vaccine Consent Questions     Do you have a history of severe allergic reactions to this vaccine? no    Any prior history of allergic reactions to egg and/or gelatin? no    Do you have a sensitivity to the preservative Thimersol? no    Do you have a past history of Guillan-Barre Syndrome? no    Do you currently have an acute febrile illness? no    Have you ever had a severe reaction to latex? no    Vaccine information given and explained to patient? yes    Are you currently pregnant? no    Lot Number:[id number]   Exp Date:07/02/2010   Site Given  Left Deltoid IMmedflu

## 2010-02-01 NOTE — Progress Notes (Signed)
Summary: Triage call- Rt. Cheek and Rt. arm pain  Phone Note Call from Patient Call back at [facility] Phone [phone]   Caller: Spouse Summary of Call: Pt's wife called this A.M. at 08:07 and [facility],  states that she feels like her husband needs to been seen today because he has pain in his rt. cheek and rt. arm for about a week now and woke up like this again this a.m.Marland KitchenMarland Kitchen Please call her back- Aurea Graff ( Wife) Call Back to [phone] Initial call taken by: Michaelle Copas,  June 08, 2009 9:21 AM  Follow-up for Phone Call        tell them to come in at 1:00. Follow-up by: Seymour Bars DO,  June 08, 2009 9:37 AM

## 2010-02-01 NOTE — Assessment & Plan Note (Signed)
Summary: INR check- jr  Nurse Visit   Vitals Entered By: Payton Spark CMA (November 09, 2009 11:14 AM)  Anticoagulation Management History:      The patient is on coumadin and comes in today for a routine follow up visit.  He is being anticoagulated because of stroke prevention.  Plans are to keep the patient on coumadin for life.  His last INR was 1.1 and today's INR is 1.5.     Allergies: No Known Drug Allergies Laboratory Results   Blood Tests      INR: 1.5   (Normal Range: 0.88-1.12   Therap INR: 2.0-3.5)    Orders Added: 1)  Fingerstick [36416] 2)  Protime [[id number]] Prescriptions: COUMADIN 1 MG TABS (WARFARIN SODIUM) Sunday - 7.5 mg, Monday - 8.5 mg, Tuesday - 7.5 mg, Wednesday - 8.5 mg, Thursday - 7.5 mg, Friday - 8.5 mg, Saturday - 7.5 mg  #30 x 1   Entered and Authorized by:     DO   Signed by:     DO on 11/09/2009   Method used:   Electronically to        [facility] [address] [id number]* (retail)       [address], [place]  [address]       Ph: [phone]       Fax: [phone]   RxID:   [id number]    Anticoagulation Management Assessment/Plan:      The target INR is 2.0-3.0.  He is to have a PT/INR in 2 weeks.  Anticoagulation instructions were given to patient.         Current Anticoagulation Instructions: The patient's dosage of coumadin will be increased.  The new dosage includes: Coumadin 7.5 mg tabs and Coumadin 1 mg tabs:  Sunday - 7.5 mg, Monday - 8.5 mg, Tuesday - 7.5 mg, Wednesday - 8.5 mg, Thursday - 7.5 mg, Friday - 8.5 mg, Saturday - 7.5 mg.    Repeat PT/INR in 2 weeks.     Appended Document: INR check- jr   Appended Document: INR check- jr Pt aware of the above

## 2010-02-01 NOTE — Letter (Signed)
Summary: [facility]  [facility]   Imported By: Lanelle Bal 09/28/2009 11:21:04  _____________________________________________________________________  External Attachment:    Type:   Image     Comment:   External Document

## 2010-02-01 NOTE — Letter (Signed)
Summary: [facility]   [facility]   Imported By: Roderic Ovens 08/17/2009 10:48:31  _____________________________________________________________________  External Attachment:    Type:   Image     Comment:   External Document

## 2010-02-01 NOTE — Assessment & Plan Note (Signed)
Summary: PT INR Check  Nurse Visit   Vitals Entered By: Payton Spark CMA (July 09, 2009 10:33 AM) CC: Needs Coumadin sent to pharm   Allergies: No Known Drug Allergies Laboratory Results   Blood Tests      INR: 1.9   (Normal Range: 0.88-1.12   Therap INR: 2.0-3.5)    Orders Added: 1)  Fingerstick [36416] 2)  Protime INR [[id number]] Prescriptions: COUMADIN 1 MG TABS (WARFARIN SODIUM) Sunday - 7.5 mg, Monday - 7.5 mg, Tuesday - 7.5 mg, Wednesday - 8.5 mg, Thursday - 7.5 mg, Friday - 7.5 mg, Saturday - 7.5 mg  #30 x 1   Entered by:   Michelle Mills CMA   Authorized by:     DO   Signed by:   Michelle Mills CMA on 07/09/2009   Method used:   Electronically to        [facility] [address] [id number]* (retail)       [address]       [place], [place]  [address]       Ph: [phone]       Fax: [phone]   RxID:   [id number] COUMADIN 7.5 MG TABS (WARFARIN SODIUM) Sunday - 7.5 mg, Monday - 7.5 mg, Tuesday - 7.5 mg, Wednesday - 8.5 mg, Thursday - 7.5 mg, Friday - 7.5 mg, Saturday - 7.5 mg  #30 x 1   Entered by:   Michelle Mills CMA   Authorized by:     DO   Signed by:   Michelle Mills CMA on 07/09/2009   Method used:   Electronically to        [facility] [address] [id number]* (retail)       [address], [place]  [address]       Ph: [phone]       Fax: [phone]   RxID:   [id number]    Anticoagulation Management History:      The patient is on coumadin and comes in today for a routine follow up visit.  He is being anticoagulated due to stroke prevention.  Plans are to keep the patient on coumadin for life.  His last INR was 1.20 and today's INR is 1.9.    Anticoagulation Management Assessment/Plan:      The target INR is 2.0-3.0.  He is to have a PT/INR in 1 week.  Anticoagulation instructions were given to patient.         Current Anticoagulation Instructions: The patient's dosage of coumadin will be increased.  The new dosage includes:    Coumadin 7.5 mg tabs and Coumadin 1 mg tabs:  Sunday - 7.5 mg, Monday - 7.5 mg, Tuesday - 7.5 mg, Wednesday - 8.5 mg, Thursday - 7.5 mg, Friday - 7.5 mg, Saturday - 7.5 mg.    Repeat PT/INR in 1 week.

## 2010-02-01 NOTE — Assessment & Plan Note (Signed)
Summary: pt check and bp check//vgj  Nurse Visit   Vital Signs:  Patient profile:   77 year old male Pulse rate:   52 / minute BP sitting:   143 / 60  (left arm) Cuff size:   regular  Vitals Entered By: Kathlene November (July 02, 2009 10:52 AM)  Allergies: No Known Drug Allergies Laboratory Results   Blood Tests   Date/Time Received: 07/02/2009 Date/Time Reported: 07/02/2009   INR: 1.2   (Normal Range: 0.88-1.12   Therap INR: 2.0-3.5)    Orders Added: 1)  Fingerstick [36416] 2)  Protime INR [85610] 3)  T-CBC w/Diff [[id number]] 4)  T-Protime, Auto [[id number]]     Anticoagulation Management History:      The patient is on coumadin and comes in today for a routine follow up visit.  The patient is on Coumadin for stroke prevention.  Plans are to keep the patient on coumadin for life.  Today's INR is 1.2.      Impression & Recommendations:  Problem # 1:  COUMADIN THERAPY (ICD-V58.61)  Orders: T-Protime, Auto ([id number])  Problem # 2:  THROMBOCYTOPENIA (ICD-287.5) Plt 144 at hosp discharge.  Will recheck today.  This may have been from Heparin use. Orders: T-CBC w/Diff ([id number])  Problem # 3:  CVA (ICD-434.91) Recheck INR at the lab downstairs to be sure it is correct. The following medications were removed from the medication list:    Warfarin Sodium 5 Mg Tabs (Warfarin sodium) ..... Use as directed by anticoagulation clinic His updated medication list for this problem includes:    Coumadin 7.5 Mg Tabs (Warfarin sodium) ..... Sunday - 7.5 mg, monday - 7.5 mg, tuesday - 7.5 mg, wednesday - 7.5 mg, thursday - 7.5 mg, friday - 7.5 mg, saturday - 7.5 mg  Orders: Fingerstick (28413) Protime INR (24401) T-Protime, Auto ([id number])  Complete Medication List: 1)  Cozaar 100 Mg Tabs (Losartan potassium) .... Take 1 tab by mouth once daily 2)  Isosorbide Dinitrate 30 Mg Tabs (Isosorbide dinitrate) .... Take 1 tab by mouth once daily 3)  Vit B12 Inj  .... Once  monthly 4)  Lexapro 20 Mg Tabs (Escitalopram oxalate) .... Take 1 tab by mouth two times a day 5)  Folic Acid 1 Mg Tabs (Folic acid) .... Take 1 tab by mouth once daily 6)  Hydrochlorothiazide 25 Mg Tabs (Hydrochlorothiazide) .... Take one half  tablet by mouth daily. 7)  Potassium Chloride Cr 10 Meq Cr-caps (Potassium chloride) .... Take one tablet by mouth daily 8)  Pravastatin Sodium 40 Mg Tabs (Pravastatin sodium) .... Take one tablet by mouth daily at bedtime 9)  Metoprolol Succinate 25 Mg Xr24h-tab (Metoprolol succinate) .[name].. 1 tab by mouth daily 10)  Coumadin 7.5 Mg Tabs (Warfarin sodium) .... Sunday - 7.5 mg, monday - 7.5 mg, tuesday - 7.5 mg, wednesday - 7.5 mg, thursday - 7.5 mg, friday - 7.5 mg, saturday - 7.5 mg  Anticoagulation Management Assessment/Plan:      The target INR is 2.0-3.0.  He is to have a PT/INR in 2 weeks.  Anticoagulation instructions were given to patient.         Current Anticoagulation Instructions: The patient is to continue with the same dose of coumadin.  This dosage includes:   Coumadin 7.5 mg tabs:  Sunday - 7.5 mg, Monday - 7.5 mg, Tuesday - 7.5 mg, Wednesday - 7.5 mg, Thursday - 7.5 mg, Friday - 7.5 mg, Saturday - 7.5 mg.    Repeat PT/INR in 2  weeks.

## 2010-02-01 NOTE — Progress Notes (Signed)
  Phone Note Outgoing Call Call back at [facility] Phone [phone]

## 2010-02-01 NOTE — Progress Notes (Signed)
Summary: pt wants diagnosis, call back   Phone Note Call from Patient   Caller: Spouse Summary of Call: I called to give wife Physical Therapy appt. and she states that they have not been contacted or told what the diagnosis is yet. Please call his wife back at [phone] and let her know diagnosis. Patient can not hear on the phone. Thank you.Michaelle Copas  Jun 01, 2009 11:34 AM  Initial call taken by: Michaelle Copas,  Jun 01, 2009 11:35 AM  Follow-up for Phone Call        wife aware Follow-up by: Payton Spark CMA,  Jun 01, 2009 12:41 PM

## 2010-02-01 NOTE — Assessment & Plan Note (Signed)
Summary: [facility]   Visit Type:  Post-hospital Primary Provider:  Seymour Bars DO   History of Present Illness: 77 year old male with past medical history of coronary artery disease for establishment. No previous records available. Previous cardiac care received in [place]. Patient has had 4 previous stent placed. The last was in January of 2009 per his report. He has had no cardiac issues since then. He apparently was admitted to [facility] approximately one week ago with a CVA. He has been placed on Lovenox and Coumadin but they do not recall being told of atrial fibrillation or an arrhythmia. He now presents for establishment. He denies dyspnea on exertion, orthopnea, PND, pedal edema, palpitations, syncope or chest pain.  Current Medications (verified): 1)  Cozaar 100 Mg Tabs (Losartan Potassium) .... Take 1 Tab By Mouth Once Daily 2)  Isosorbide Dinitrate 30 Mg Tabs (Isosorbide Dinitrate) .... Take 1 Tab By Mouth Once Daily 3)  Vit B12 Inj .... Once Monthly 4)  Lexapro 20 Mg Tabs (Escitalopram Oxalate) .... Take 1 Tab By Mouth Two Times A Day 5)  Folic Acid 1 Mg Tabs (Folic Acid) .... Take 1 Tab By Mouth Once Daily 6)  Hydrochlorothiazide 25 Mg Tabs (Hydrochlorothiazide) .... Take One Tablet By Mouth Daily. 7)  Potassium Chloride Cr 10 Meq Cr-Caps (Potassium Chloride) .... Take One Tablet By Mouth Daily 8)  Warfarin Sodium 5 Mg Tabs (Warfarin Sodium) .... Use As Directed By Anticoagulation Clinic 9)  Lovenox 100 Mg/ml Soln (Enoxaparin Sodium) .... As Directed 10)  Pravastatin Sodium 40 Mg Tabs (Pravastatin Sodium) .... Take One Tablet By Mouth Daily At Bedtime  Allergies (verified): No Known Drug Allergies  Past History:  Past Medical History: CAD (with stenting x 4 in [place]) HTN depression/ anxiety Hyperlipidemia MACULAR DEGENERATION   Past Surgical History: Appendectomy Cholecystectomy Tonsillectomy  Family History: Reviewed history from 05/28/2009 and no  changes required. Mother died at 47, CVA,  HTN, high chol, DM father died at 23 - hit by a car sister died at 88, brain tumor brother died at 21, CVA  Social History: Reviewed history from 05/28/2009 and no changes required. Retired [employer] park manager. Married to [place].   Has 3 grown children.  Daughter - Roosvelt Harps is here, 2 others in [place].  Has 12 grandkids. No exercise. Never smoked. 2 ETOH/ DAy.  Review of Systems       Residual numbness in right hand and around mouth but no fevers or chills, productive cough, hemoptysis, dysphasia, odynophagia, melena, hematochezia, dysuria, hematuria, rash, seizure activity, orthopnea, PND, pedal edema, claudication. Remaining systems are negative.   Vital Signs:  Patient profile:   77 year old male Height:      66 inches Weight:      204.75 pounds BMI:     33.17 Pulse rate:   66 / minute Pulse rhythm:   regular Resp:     18 per minute BP sitting:   147 / 74  (right arm) Cuff size:   large  Vitals Entered By: Vikki Ports (June 15, 2009 2:12 PM)  Physical Exam  General:  Well developed/well nourished in NAD Skin warm/dry Patient not depressed No peripheral clubbing Back-normal HEENT-normal/normal eyelids Neck supple/normal carotid upstroke bilaterally; no bruits; no JVD; no thyromegaly chest - CTA/ normal expansion CV - RRR/normal S1 and S2; no murmurs, rubs or gallops;  PMI nondisplaced Abdomen -NT/ND, no HSM, no mass, + bowel sounds, no bruit, status post cholecystectomy 2+ femoral pulses, no bruits Ext-no edema, chords,  2+ DP Neuro-grossly nonfocal     EKG  Procedure date:  06/15/2009  Findings:      Normal sinus. Left ventricular hypertrophy. Lateral T-wave inversion.  Impression & Recommendations:  Problem # 1:  CAD (ICD-414.00) No recent chest pain. Continue present medications. Obtain records from [place]. The following medications were removed from the medication list:    Toprol Xl 50 Mg Xr24h-tab  (Metoprolol succinate) .[name]... Take 1 tab by mouth once daily His updated medication list for this problem includes:    Isosorbide Dinitrate 30 Mg Tabs (Isosorbide dinitrate) .[name]... Take 1 tab by mouth once daily    Warfarin Sodium 5 Mg Tabs (Warfarin sodium) ..... Use as directed by anticoagulation clinic    Lovenox 100 Mg/ml Soln (Enoxaparin sodium) .[name]... As directed    Metoprolol Succinate 25 Mg Xr24h-tab (Metoprolol succinate) .[name]... Take one half  tablet by mouth daily  The following medications were removed from the medication list:    Toprol Xl 50 Mg Xr24h-tab (Metoprolol succinate) .[name]... Take 1 tab by mouth once daily His updated medication list for this problem includes:    Isosorbide Dinitrate 30 Mg Tabs (Isosorbide dinitrate) .[name]... Take 1 tab by mouth once daily    Warfarin Sodium 5 Mg Tabs (Warfarin sodium) ..... Use as directed by anticoagulation clinic    Lovenox 100 Mg/ml Soln (Enoxaparin sodium) .[name]... As directed    Metoprolol Succinate 25 Mg Xr24h-tab (Metoprolol succinate) .[name]... Take one half  tablet by mouth daily  Problem # 2:  CVA (ICD-434.91) Patient had a CVA one week ago. I will have his records from [facility] forwarded to [place]. Continue Lovenox and Coumadin for now. His Coumadin is being monitored by Dr. Cathey Endow. His updated medication list for this problem includes:    Warfarin Sodium 5 Mg Tabs (Warfarin sodium) ..... Use as directed by anticoagulation clinic  Problem # 3:  HYPERLIPIDEMIA (ICD-272.4) Continue statin. Lipids and liver monitor by primary care. His updated medication list for this problem includes:    Pravastatin Sodium 40 Mg Tabs (Pravastatin sodium) .[name]... Take one tablet by mouth daily at bedtime  Problem # 4:  ESSENTIAL HYPERTENSION, BENIGN (ICD-401.1) Blood pressure mildly elevated. Add Toprol 12.5 mg p.o. daily. The following medications were removed from the medication list:    Toprol Xl 50 Mg Xr24h-tab (Metoprolol succinate) .[name]... Take 1 tab  by mouth once daily His updated medication list for this problem includes:    Cozaar 100 Mg Tabs (Losartan potassium) .[name]... Take 1 tab by mouth once daily    Hydrochlorothiazide 25 Mg Tabs (Hydrochlorothiazide) .[name]... Take one tablet by mouth daily.    Metoprolol Succinate 25 Mg Xr24h-tab (Metoprolol succinate) .[name]... Take one half  tablet by mouth daily  Problem # 5:  MACULAR DEGENERATION (ICD-362.50)  Patient Instructions: 1)  Your physician recommends that you schedule a follow-up appointment in: 3 MONTHS 2)  Your physician has recommended you make the following change in your medication: START METOPROLOL SUCC 25MG  ONE HALF TABLET ONCE DAILY Prescriptions: METOPROLOL SUCCINATE 25 MG XR24H-TAB (METOPROLOL SUCCINATE) Take one half  tablet by mouth daily  #30 x 12   Entered by:   Deliah Goody, RN   Authorized by:   Ferman Hamming, MD, [facility]   Signed by:   Deliah Goody, RN on 06/15/2009   Method used:   Electronically to        [facility] [phone]* (retail)       [address]  [place], [place]  [address]       Ph: [phone]       Fax: [phone]   RxID:   [id number]

## 2010-04-24 ENCOUNTER — Encounter: Payer: Self-pay | Admitting: Family Medicine

## 2010-04-25 ENCOUNTER — Encounter: Payer: Self-pay | Admitting: Family Medicine

## 2010-04-25 ENCOUNTER — Ambulatory Visit: Payer: Self-pay | Admitting: Family Medicine

## 2010-04-26 ENCOUNTER — Ambulatory Visit (INDEPENDENT_AMBULATORY_CARE_PROVIDER_SITE_OTHER): Payer: Medicare Other | Admitting: Family Medicine

## 2010-04-26 ENCOUNTER — Encounter: Payer: Self-pay | Admitting: Family Medicine

## 2010-04-26 VITALS — BP 182/82 | HR 58 | Ht 66.0 in | Wt 210.0 lb

## 2010-04-26 DIAGNOSIS — I10 Essential (primary) hypertension: Secondary | ICD-10-CM

## 2010-04-26 DIAGNOSIS — E538 Deficiency of other specified B group vitamins: Secondary | ICD-10-CM

## 2010-04-26 MED ORDER — CYANOCOBALAMIN 1000 MCG/ML IJ SOLN
1000.0000 ug | INTRAMUSCULAR | Status: DC
  Administered 2010-04-26: 1000 ug via INTRAMUSCULAR

## 2010-04-26 NOTE — Progress Notes (Signed)
  Subjective:    Patient ID: Carlos Harrington, male    DOB: 1932-08-24, 77 y.o.   MRN: [id number]  HPI 77 yo WM with hx of CAD and CVA presents for f/u BP.  He admits to just not taking his BP meds b/c he 'felt fine and didn't want to take them'.  Denies CP or DOE.  Denies palpitations or leg swelling.  Has physical/ labs set up at the [place] in the next month.  BP 182/82  Pulse 58  Ht 5\' 6"  (1.676 m)  Wt 210 lb (95.255 kg)  BMI 33.89 kg/m2  SpO2 96%     Review of Systems as per HPI     Objective:   Physical Exam  Constitutional: He appears well-developed and well-nourished.  Cardiovascular: Normal rate and regular rhythm.   Pulmonary/Chest: Effort normal and breath sounds normal.  Musculoskeletal: He exhibits edema (1+ LE edema).  Psychiatric: He has a normal mood and affect.          Assessment & Plan:

## 2010-04-26 NOTE — Patient Instructions (Signed)
Return for annual medicare wellness exam/ fasting labs in 3 mos.

## 2010-04-26 NOTE — Assessment & Plan Note (Signed)
I reviewed with Carlos Harrington the importance of taking his meds.  He agrees to taking everyting on his list.  We reviewed that HTN is a 'silent killer' and will not give symptoms, so he cannot go by this.

## 2010-06-07 ENCOUNTER — Ambulatory Visit (INDEPENDENT_AMBULATORY_CARE_PROVIDER_SITE_OTHER): Payer: Medicare Other | Admitting: Family Medicine

## 2010-06-07 DIAGNOSIS — E538 Deficiency of other specified B group vitamins: Secondary | ICD-10-CM

## 2010-06-07 MED ORDER — CYANOCOBALAMIN 1000 MCG/ML IJ SOLN
1000.0000 ug | INTRAMUSCULAR | Status: DC
  Administered 2010-06-07: 1000 ug via INTRAMUSCULAR

## 2010-06-29 ENCOUNTER — Encounter: Payer: Medicare Other | Admitting: Family Medicine

## 2010-07-12 ENCOUNTER — Other Ambulatory Visit: Payer: Self-pay | Admitting: *Deleted

## 2010-07-12 MED ORDER — HYDROCHLOROTHIAZIDE 25 MG PO TABS: 25.0000 mg | ORAL_TABLET | Freq: Every day | ORAL | Status: DC

## 2010-07-13 ENCOUNTER — Encounter: Payer: Self-pay | Admitting: Family Medicine

## 2010-07-26 ENCOUNTER — Ambulatory Visit: Payer: Medicare Other | Admitting: Family Medicine

## 2011-01-24 DIAGNOSIS — I10 Essential (primary) hypertension: Secondary | ICD-10-CM | POA: Diagnosis not present

## 2011-01-24 DIAGNOSIS — J069 Acute upper respiratory infection, unspecified: Secondary | ICD-10-CM | POA: Diagnosis not present

## 2011-03-30 DIAGNOSIS — I6350 Cerebral infarction due to unspecified occlusion or stenosis of unspecified cerebral artery: Secondary | ICD-10-CM | POA: Diagnosis not present

## 2011-03-30 DIAGNOSIS — G5600 Carpal tunnel syndrome, unspecified upper limb: Secondary | ICD-10-CM | POA: Diagnosis not present

## 2011-05-02 DIAGNOSIS — J069 Acute upper respiratory infection, unspecified: Secondary | ICD-10-CM | POA: Diagnosis not present

## 2011-05-08 DIAGNOSIS — R059 Cough, unspecified: Secondary | ICD-10-CM | POA: Diagnosis not present

## 2011-05-08 DIAGNOSIS — J40 Bronchitis, not specified as acute or chronic: Secondary | ICD-10-CM | POA: Diagnosis not present

## 2011-05-08 DIAGNOSIS — R05 Cough: Secondary | ICD-10-CM | POA: Diagnosis not present

## 2011-05-08 DIAGNOSIS — R062 Wheezing: Secondary | ICD-10-CM | POA: Diagnosis not present

## 2011-05-17 DIAGNOSIS — R062 Wheezing: Secondary | ICD-10-CM | POA: Diagnosis not present

## 2011-05-17 DIAGNOSIS — R05 Cough: Secondary | ICD-10-CM | POA: Diagnosis not present

## 2011-05-17 DIAGNOSIS — R059 Cough, unspecified: Secondary | ICD-10-CM | POA: Diagnosis not present

## 2011-05-24 DIAGNOSIS — R062 Wheezing: Secondary | ICD-10-CM | POA: Diagnosis not present

## 2011-05-24 DIAGNOSIS — H6120 Impacted cerumen, unspecified ear: Secondary | ICD-10-CM | POA: Diagnosis not present

## 2011-06-14 DIAGNOSIS — H9209 Otalgia, unspecified ear: Secondary | ICD-10-CM | POA: Diagnosis not present

## 2011-07-03 DIAGNOSIS — I6350 Cerebral infarction due to unspecified occlusion or stenosis of unspecified cerebral artery: Secondary | ICD-10-CM | POA: Diagnosis not present

## 2011-07-03 DIAGNOSIS — I651 Occlusion and stenosis of basilar artery: Secondary | ICD-10-CM | POA: Diagnosis not present

## 2011-07-03 DIAGNOSIS — I6529 Occlusion and stenosis of unspecified carotid artery: Secondary | ICD-10-CM | POA: Diagnosis not present

## 2011-07-03 DIAGNOSIS — G5600 Carpal tunnel syndrome, unspecified upper limb: Secondary | ICD-10-CM | POA: Diagnosis not present

## 2011-07-21 DIAGNOSIS — I10 Essential (primary) hypertension: Secondary | ICD-10-CM | POA: Diagnosis not present

## 2011-07-21 DIAGNOSIS — M545 Low back pain: Secondary | ICD-10-CM | POA: Diagnosis not present

## 2011-08-04 DIAGNOSIS — Z8673 Personal history of transient ischemic attack (TIA), and cerebral infarction without residual deficits: Secondary | ICD-10-CM | POA: Diagnosis not present

## 2011-08-04 DIAGNOSIS — R110 Nausea: Secondary | ICD-10-CM | POA: Diagnosis not present

## 2011-08-04 DIAGNOSIS — I6789 Other cerebrovascular disease: Secondary | ICD-10-CM | POA: Diagnosis not present

## 2011-08-04 DIAGNOSIS — R42 Dizziness and giddiness: Secondary | ICD-10-CM | POA: Diagnosis not present

## 2011-08-04 DIAGNOSIS — R51 Headache: Secondary | ICD-10-CM | POA: Diagnosis not present

## 2011-08-04 DIAGNOSIS — I498 Other specified cardiac arrhythmias: Secondary | ICD-10-CM | POA: Diagnosis not present

## 2011-08-09 DIAGNOSIS — Z8673 Personal history of transient ischemic attack (TIA), and cerebral infarction without residual deficits: Secondary | ICD-10-CM | POA: Diagnosis not present

## 2011-08-09 DIAGNOSIS — E785 Hyperlipidemia, unspecified: Secondary | ICD-10-CM | POA: Diagnosis not present

## 2011-08-09 DIAGNOSIS — I10 Essential (primary) hypertension: Secondary | ICD-10-CM | POA: Diagnosis not present

## 2011-08-09 DIAGNOSIS — R42 Dizziness and giddiness: Secondary | ICD-10-CM | POA: Diagnosis not present

## 2011-08-22 DIAGNOSIS — M7730 Calcaneal spur, unspecified foot: Secondary | ICD-10-CM | POA: Diagnosis not present

## 2011-08-22 DIAGNOSIS — M7150 Other bursitis, not elsewhere classified, unspecified site: Secondary | ICD-10-CM | POA: Diagnosis not present

## 2011-08-22 DIAGNOSIS — M722 Plantar fascial fibromatosis: Secondary | ICD-10-CM | POA: Diagnosis not present

## 2011-08-30 DIAGNOSIS — I10 Essential (primary) hypertension: Secondary | ICD-10-CM | POA: Diagnosis not present

## 2011-08-30 DIAGNOSIS — Z79899 Other long term (current) drug therapy: Secondary | ICD-10-CM | POA: Diagnosis not present

## 2011-08-30 DIAGNOSIS — N400 Enlarged prostate without lower urinary tract symptoms: Secondary | ICD-10-CM | POA: Diagnosis not present

## 2011-08-30 DIAGNOSIS — R972 Elevated prostate specific antigen [PSA]: Secondary | ICD-10-CM | POA: Diagnosis not present

## 2011-09-20 DIAGNOSIS — R972 Elevated prostate specific antigen [PSA]: Secondary | ICD-10-CM | POA: Diagnosis not present

## 2011-09-20 DIAGNOSIS — I10 Essential (primary) hypertension: Secondary | ICD-10-CM | POA: Diagnosis not present

## 2011-09-20 DIAGNOSIS — N400 Enlarged prostate without lower urinary tract symptoms: Secondary | ICD-10-CM | POA: Diagnosis not present

## 2011-10-11 DIAGNOSIS — Z79899 Other long term (current) drug therapy: Secondary | ICD-10-CM | POA: Diagnosis not present

## 2011-10-11 DIAGNOSIS — Z974 Presence of external hearing-aid: Secondary | ICD-10-CM | POA: Insufficient documentation

## 2011-10-11 DIAGNOSIS — R7301 Impaired fasting glucose: Secondary | ICD-10-CM | POA: Diagnosis not present

## 2011-10-11 DIAGNOSIS — H6120 Impacted cerumen, unspecified ear: Secondary | ICD-10-CM | POA: Diagnosis not present

## 2011-10-11 DIAGNOSIS — I10 Essential (primary) hypertension: Secondary | ICD-10-CM | POA: Diagnosis not present

## 2011-10-11 DIAGNOSIS — E785 Hyperlipidemia, unspecified: Secondary | ICD-10-CM | POA: Diagnosis not present

## 2011-10-11 DIAGNOSIS — Z23 Encounter for immunization: Secondary | ICD-10-CM | POA: Diagnosis not present

## 2011-10-11 DIAGNOSIS — Z8673 Personal history of transient ischemic attack (TIA), and cerebral infarction without residual deficits: Secondary | ICD-10-CM | POA: Diagnosis not present

## 2011-10-11 DIAGNOSIS — Z9889 Other specified postprocedural states: Secondary | ICD-10-CM | POA: Diagnosis not present

## 2011-10-24 DIAGNOSIS — M7150 Other bursitis, not elsewhere classified, unspecified site: Secondary | ICD-10-CM | POA: Diagnosis not present

## 2011-10-24 DIAGNOSIS — M722 Plantar fascial fibromatosis: Secondary | ICD-10-CM | POA: Diagnosis not present

## 2011-10-24 DIAGNOSIS — M7730 Calcaneal spur, unspecified foot: Secondary | ICD-10-CM | POA: Diagnosis not present

## 2011-10-26 DIAGNOSIS — H6120 Impacted cerumen, unspecified ear: Secondary | ICD-10-CM | POA: Diagnosis not present

## 2011-11-08 DIAGNOSIS — I10 Essential (primary) hypertension: Secondary | ICD-10-CM | POA: Diagnosis not present

## 2011-11-08 DIAGNOSIS — E669 Obesity, unspecified: Secondary | ICD-10-CM | POA: Diagnosis not present

## 2011-11-08 DIAGNOSIS — R0609 Other forms of dyspnea: Secondary | ICD-10-CM | POA: Diagnosis not present

## 2011-11-08 DIAGNOSIS — Z79899 Other long term (current) drug therapy: Secondary | ICD-10-CM | POA: Diagnosis not present

## 2011-12-04 DIAGNOSIS — A4289 Other forms of actinomycosis: Secondary | ICD-10-CM | POA: Diagnosis not present

## 2011-12-04 DIAGNOSIS — I10 Essential (primary) hypertension: Secondary | ICD-10-CM | POA: Diagnosis not present

## 2011-12-29 DIAGNOSIS — M5430 Sciatica, unspecified side: Secondary | ICD-10-CM | POA: Diagnosis not present

## 2012-01-09 DIAGNOSIS — G4710 Hypersomnia, unspecified: Secondary | ICD-10-CM | POA: Diagnosis not present

## 2012-01-24 DIAGNOSIS — R7301 Impaired fasting glucose: Secondary | ICD-10-CM | POA: Diagnosis not present

## 2012-01-24 DIAGNOSIS — N400 Enlarged prostate without lower urinary tract symptoms: Secondary | ICD-10-CM | POA: Diagnosis not present

## 2012-01-24 DIAGNOSIS — Z79899 Other long term (current) drug therapy: Secondary | ICD-10-CM | POA: Diagnosis not present

## 2012-01-24 DIAGNOSIS — I10 Essential (primary) hypertension: Secondary | ICD-10-CM | POA: Diagnosis not present

## 2012-01-24 DIAGNOSIS — R350 Frequency of micturition: Secondary | ICD-10-CM | POA: Diagnosis not present

## 2012-01-24 DIAGNOSIS — R972 Elevated prostate specific antigen [PSA]: Secondary | ICD-10-CM | POA: Diagnosis not present

## 2012-01-24 DIAGNOSIS — E785 Hyperlipidemia, unspecified: Secondary | ICD-10-CM | POA: Diagnosis not present

## 2012-01-25 DIAGNOSIS — G4710 Hypersomnia, unspecified: Secondary | ICD-10-CM | POA: Diagnosis not present

## 2012-02-06 DIAGNOSIS — G4733 Obstructive sleep apnea (adult) (pediatric): Secondary | ICD-10-CM | POA: Diagnosis not present

## 2012-02-07 DIAGNOSIS — I10 Essential (primary) hypertension: Secondary | ICD-10-CM | POA: Diagnosis not present

## 2012-02-20 DIAGNOSIS — Z79899 Other long term (current) drug therapy: Secondary | ICD-10-CM | POA: Diagnosis not present

## 2012-02-20 DIAGNOSIS — I10 Essential (primary) hypertension: Secondary | ICD-10-CM | POA: Diagnosis not present

## 2012-02-20 DIAGNOSIS — Z125 Encounter for screening for malignant neoplasm of prostate: Secondary | ICD-10-CM | POA: Diagnosis not present

## 2012-02-20 DIAGNOSIS — R972 Elevated prostate specific antigen [PSA]: Secondary | ICD-10-CM | POA: Diagnosis not present

## 2012-02-20 DIAGNOSIS — N400 Enlarged prostate without lower urinary tract symptoms: Secondary | ICD-10-CM | POA: Diagnosis not present

## 2012-02-21 DIAGNOSIS — E785 Hyperlipidemia, unspecified: Secondary | ICD-10-CM | POA: Diagnosis not present

## 2012-02-21 DIAGNOSIS — N400 Enlarged prostate without lower urinary tract symptoms: Secondary | ICD-10-CM | POA: Diagnosis not present

## 2012-02-21 DIAGNOSIS — N183 Chronic kidney disease, stage 3 unspecified: Secondary | ICD-10-CM | POA: Insufficient documentation

## 2012-02-21 DIAGNOSIS — I10 Essential (primary) hypertension: Secondary | ICD-10-CM | POA: Diagnosis not present

## 2012-02-21 DIAGNOSIS — R972 Elevated prostate specific antigen [PSA]: Secondary | ICD-10-CM | POA: Diagnosis not present

## 2012-04-08 DIAGNOSIS — M5412 Radiculopathy, cervical region: Secondary | ICD-10-CM | POA: Diagnosis not present

## 2012-04-08 DIAGNOSIS — N183 Chronic kidney disease, stage 3 unspecified: Secondary | ICD-10-CM | POA: Diagnosis not present

## 2012-04-08 DIAGNOSIS — R209 Unspecified disturbances of skin sensation: Secondary | ICD-10-CM | POA: Diagnosis not present

## 2012-04-08 DIAGNOSIS — I10 Essential (primary) hypertension: Secondary | ICD-10-CM | POA: Diagnosis not present

## 2012-05-14 DIAGNOSIS — H33329 Round hole, unspecified eye: Secondary | ICD-10-CM | POA: Diagnosis not present

## 2012-05-14 DIAGNOSIS — H3581 Retinal edema: Secondary | ICD-10-CM | POA: Diagnosis not present

## 2012-05-14 DIAGNOSIS — H526 Other disorders of refraction: Secondary | ICD-10-CM | POA: Diagnosis not present

## 2012-05-14 DIAGNOSIS — H40039 Anatomical narrow angle, unspecified eye: Secondary | ICD-10-CM | POA: Diagnosis not present

## 2012-05-14 DIAGNOSIS — H35379 Puckering of macula, unspecified eye: Secondary | ICD-10-CM | POA: Diagnosis not present

## 2012-05-14 DIAGNOSIS — H2589 Other age-related cataract: Secondary | ICD-10-CM | POA: Diagnosis not present

## 2012-05-14 DIAGNOSIS — H348392 Tributary (branch) retinal vein occlusion, unspecified eye, stable: Secondary | ICD-10-CM | POA: Diagnosis not present

## 2012-06-06 DIAGNOSIS — H2589 Other age-related cataract: Secondary | ICD-10-CM | POA: Diagnosis not present

## 2012-06-06 DIAGNOSIS — H33329 Round hole, unspecified eye: Secondary | ICD-10-CM | POA: Diagnosis not present

## 2012-06-06 DIAGNOSIS — H348392 Tributary (branch) retinal vein occlusion, unspecified eye, stable: Secondary | ICD-10-CM | POA: Diagnosis not present

## 2012-06-06 DIAGNOSIS — H40039 Anatomical narrow angle, unspecified eye: Secondary | ICD-10-CM | POA: Diagnosis not present

## 2012-06-06 DIAGNOSIS — H5210 Myopia, unspecified eye: Secondary | ICD-10-CM | POA: Diagnosis not present

## 2012-06-06 DIAGNOSIS — H35379 Puckering of macula, unspecified eye: Secondary | ICD-10-CM | POA: Diagnosis not present

## 2012-06-06 DIAGNOSIS — H3581 Retinal edema: Secondary | ICD-10-CM | POA: Diagnosis not present

## 2012-07-10 DIAGNOSIS — Z8673 Personal history of transient ischemic attack (TIA), and cerebral infarction without residual deficits: Secondary | ICD-10-CM | POA: Diagnosis not present

## 2012-07-10 DIAGNOSIS — H3581 Retinal edema: Secondary | ICD-10-CM | POA: Diagnosis not present

## 2012-07-10 DIAGNOSIS — H5210 Myopia, unspecified eye: Secondary | ICD-10-CM | POA: Diagnosis not present

## 2012-07-10 DIAGNOSIS — I10 Essential (primary) hypertension: Secondary | ICD-10-CM | POA: Diagnosis not present

## 2012-07-10 DIAGNOSIS — H2589 Other age-related cataract: Secondary | ICD-10-CM | POA: Diagnosis not present

## 2012-07-10 DIAGNOSIS — H33329 Round hole, unspecified eye: Secondary | ICD-10-CM | POA: Diagnosis not present

## 2012-07-10 DIAGNOSIS — H348392 Tributary (branch) retinal vein occlusion, unspecified eye, stable: Secondary | ICD-10-CM | POA: Diagnosis not present

## 2012-07-10 DIAGNOSIS — H40039 Anatomical narrow angle, unspecified eye: Secondary | ICD-10-CM | POA: Diagnosis not present

## 2012-07-10 DIAGNOSIS — Z79899 Other long term (current) drug therapy: Secondary | ICD-10-CM | POA: Diagnosis not present

## 2012-07-10 DIAGNOSIS — H35379 Puckering of macula, unspecified eye: Secondary | ICD-10-CM | POA: Diagnosis not present

## 2012-08-08 DIAGNOSIS — H40039 Anatomical narrow angle, unspecified eye: Secondary | ICD-10-CM | POA: Diagnosis not present

## 2012-08-08 DIAGNOSIS — H3581 Retinal edema: Secondary | ICD-10-CM | POA: Diagnosis not present

## 2012-08-08 DIAGNOSIS — H35379 Puckering of macula, unspecified eye: Secondary | ICD-10-CM | POA: Diagnosis not present

## 2012-08-08 DIAGNOSIS — H2589 Other age-related cataract: Secondary | ICD-10-CM | POA: Diagnosis not present

## 2012-08-08 DIAGNOSIS — H33329 Round hole, unspecified eye: Secondary | ICD-10-CM | POA: Diagnosis not present

## 2012-08-08 DIAGNOSIS — H5210 Myopia, unspecified eye: Secondary | ICD-10-CM | POA: Diagnosis not present

## 2012-08-08 DIAGNOSIS — H348392 Tributary (branch) retinal vein occlusion, unspecified eye, stable: Secondary | ICD-10-CM | POA: Diagnosis not present

## 2012-09-17 DIAGNOSIS — I059 Rheumatic mitral valve disease, unspecified: Secondary | ICD-10-CM | POA: Diagnosis not present

## 2012-09-17 DIAGNOSIS — Z800 Family history of malignant neoplasm of digestive organs: Secondary | ICD-10-CM | POA: Diagnosis not present

## 2012-09-17 DIAGNOSIS — Z6832 Body mass index (BMI) 32.0-32.9, adult: Secondary | ICD-10-CM | POA: Diagnosis not present

## 2012-09-17 DIAGNOSIS — I6350 Cerebral infarction due to unspecified occlusion or stenosis of unspecified cerebral artery: Secondary | ICD-10-CM | POA: Diagnosis not present

## 2012-09-17 DIAGNOSIS — I10 Essential (primary) hypertension: Secondary | ICD-10-CM | POA: Diagnosis not present

## 2012-09-17 DIAGNOSIS — H9190 Unspecified hearing loss, unspecified ear: Secondary | ICD-10-CM | POA: Diagnosis not present

## 2012-09-17 DIAGNOSIS — R29898 Other symptoms and signs involving the musculoskeletal system: Secondary | ICD-10-CM | POA: Diagnosis present

## 2012-09-17 DIAGNOSIS — Z833 Family history of diabetes mellitus: Secondary | ICD-10-CM | POA: Diagnosis not present

## 2012-09-17 DIAGNOSIS — Z8249 Family history of ischemic heart disease and other diseases of the circulatory system: Secondary | ICD-10-CM | POA: Diagnosis not present

## 2012-09-17 DIAGNOSIS — I5189 Other ill-defined heart diseases: Secondary | ICD-10-CM | POA: Diagnosis not present

## 2012-09-17 DIAGNOSIS — I517 Cardiomegaly: Secondary | ICD-10-CM | POA: Diagnosis not present

## 2012-09-17 DIAGNOSIS — I509 Heart failure, unspecified: Secondary | ICD-10-CM | POA: Diagnosis present

## 2012-09-17 DIAGNOSIS — E876 Hypokalemia: Secondary | ICD-10-CM | POA: Diagnosis not present

## 2012-09-17 DIAGNOSIS — N183 Chronic kidney disease, stage 3 unspecified: Secondary | ICD-10-CM | POA: Diagnosis not present

## 2012-09-17 DIAGNOSIS — G4730 Sleep apnea, unspecified: Secondary | ICD-10-CM | POA: Diagnosis present

## 2012-09-17 DIAGNOSIS — R079 Chest pain, unspecified: Secondary | ICD-10-CM | POA: Diagnosis not present

## 2012-09-17 DIAGNOSIS — R0789 Other chest pain: Secondary | ICD-10-CM | POA: Diagnosis present

## 2012-09-17 DIAGNOSIS — Z79899 Other long term (current) drug therapy: Secondary | ICD-10-CM | POA: Diagnosis not present

## 2012-09-17 DIAGNOSIS — I129 Hypertensive chronic kidney disease with stage 1 through stage 4 chronic kidney disease, or unspecified chronic kidney disease: Secondary | ICD-10-CM | POA: Diagnosis not present

## 2012-09-17 DIAGNOSIS — I369 Nonrheumatic tricuspid valve disorder, unspecified: Secondary | ICD-10-CM | POA: Diagnosis not present

## 2012-09-17 DIAGNOSIS — Z7982 Long term (current) use of aspirin: Secondary | ICD-10-CM | POA: Diagnosis not present

## 2012-09-17 DIAGNOSIS — Z823 Family history of stroke: Secondary | ICD-10-CM | POA: Diagnosis not present

## 2012-09-17 DIAGNOSIS — R0989 Other specified symptoms and signs involving the circulatory and respiratory systems: Secondary | ICD-10-CM | POA: Diagnosis present

## 2012-09-17 DIAGNOSIS — E785 Hyperlipidemia, unspecified: Secondary | ICD-10-CM | POA: Diagnosis not present

## 2012-09-17 DIAGNOSIS — I252 Old myocardial infarction: Secondary | ICD-10-CM | POA: Diagnosis not present

## 2012-09-17 DIAGNOSIS — R918 Other nonspecific abnormal finding of lung field: Secondary | ICD-10-CM | POA: Diagnosis not present

## 2012-09-17 DIAGNOSIS — M129 Arthropathy, unspecified: Secondary | ICD-10-CM | POA: Diagnosis not present

## 2012-09-17 DIAGNOSIS — Z8673 Personal history of transient ischemic attack (TIA), and cerebral infarction without residual deficits: Secondary | ICD-10-CM | POA: Diagnosis not present

## 2012-09-17 DIAGNOSIS — Z9861 Coronary angioplasty status: Secondary | ICD-10-CM | POA: Diagnosis not present

## 2012-09-17 DIAGNOSIS — Z8261 Family history of arthritis: Secondary | ICD-10-CM | POA: Diagnosis not present

## 2012-09-17 DIAGNOSIS — E669 Obesity, unspecified: Secondary | ICD-10-CM | POA: Diagnosis not present

## 2012-09-17 DIAGNOSIS — I2510 Atherosclerotic heart disease of native coronary artery without angina pectoris: Secondary | ICD-10-CM | POA: Diagnosis present

## 2012-09-18 DIAGNOSIS — I2510 Atherosclerotic heart disease of native coronary artery without angina pectoris: Secondary | ICD-10-CM | POA: Diagnosis not present

## 2012-09-18 DIAGNOSIS — R079 Chest pain, unspecified: Secondary | ICD-10-CM | POA: Diagnosis not present

## 2012-09-18 DIAGNOSIS — R0789 Other chest pain: Secondary | ICD-10-CM | POA: Diagnosis not present

## 2012-09-18 DIAGNOSIS — I129 Hypertensive chronic kidney disease with stage 1 through stage 4 chronic kidney disease, or unspecified chronic kidney disease: Secondary | ICD-10-CM | POA: Diagnosis not present

## 2012-09-18 DIAGNOSIS — E785 Hyperlipidemia, unspecified: Secondary | ICD-10-CM | POA: Diagnosis not present

## 2012-09-18 DIAGNOSIS — Z9861 Coronary angioplasty status: Secondary | ICD-10-CM | POA: Diagnosis not present

## 2012-09-18 DIAGNOSIS — I509 Heart failure, unspecified: Secondary | ICD-10-CM | POA: Diagnosis not present

## 2012-09-18 DIAGNOSIS — I6350 Cerebral infarction due to unspecified occlusion or stenosis of unspecified cerebral artery: Secondary | ICD-10-CM | POA: Diagnosis not present

## 2012-09-18 DIAGNOSIS — I252 Old myocardial infarction: Secondary | ICD-10-CM | POA: Diagnosis not present

## 2012-09-19 DIAGNOSIS — I509 Heart failure, unspecified: Secondary | ICD-10-CM | POA: Diagnosis not present

## 2012-09-19 DIAGNOSIS — E785 Hyperlipidemia, unspecified: Secondary | ICD-10-CM | POA: Diagnosis not present

## 2012-09-19 DIAGNOSIS — R0789 Other chest pain: Secondary | ICD-10-CM | POA: Diagnosis not present

## 2012-09-19 DIAGNOSIS — R918 Other nonspecific abnormal finding of lung field: Secondary | ICD-10-CM | POA: Diagnosis not present

## 2012-09-19 DIAGNOSIS — I369 Nonrheumatic tricuspid valve disorder, unspecified: Secondary | ICD-10-CM | POA: Diagnosis not present

## 2012-09-19 DIAGNOSIS — I6350 Cerebral infarction due to unspecified occlusion or stenosis of unspecified cerebral artery: Secondary | ICD-10-CM | POA: Diagnosis not present

## 2012-09-19 DIAGNOSIS — R079 Chest pain, unspecified: Secondary | ICD-10-CM | POA: Diagnosis not present

## 2012-09-19 DIAGNOSIS — I2510 Atherosclerotic heart disease of native coronary artery without angina pectoris: Secondary | ICD-10-CM | POA: Diagnosis not present

## 2012-09-19 DIAGNOSIS — I129 Hypertensive chronic kidney disease with stage 1 through stage 4 chronic kidney disease, or unspecified chronic kidney disease: Secondary | ICD-10-CM | POA: Diagnosis not present

## 2012-09-19 DIAGNOSIS — I059 Rheumatic mitral valve disease, unspecified: Secondary | ICD-10-CM | POA: Diagnosis not present

## 2012-09-19 DIAGNOSIS — I252 Old myocardial infarction: Secondary | ICD-10-CM | POA: Diagnosis not present

## 2012-09-19 DIAGNOSIS — Z9861 Coronary angioplasty status: Secondary | ICD-10-CM | POA: Diagnosis not present

## 2012-09-19 DIAGNOSIS — I517 Cardiomegaly: Secondary | ICD-10-CM | POA: Diagnosis not present

## 2012-09-19 DIAGNOSIS — I5189 Other ill-defined heart diseases: Secondary | ICD-10-CM | POA: Diagnosis not present

## 2012-09-24 ENCOUNTER — Ambulatory Visit (INDEPENDENT_AMBULATORY_CARE_PROVIDER_SITE_OTHER): Payer: Medicare Other | Admitting: Family Medicine

## 2012-09-24 ENCOUNTER — Encounter: Payer: Self-pay | Admitting: Family Medicine

## 2012-09-24 VITALS — BP 146/64 | HR 56 | Wt 221.0 lb

## 2012-09-24 DIAGNOSIS — R079 Chest pain, unspecified: Secondary | ICD-10-CM

## 2012-09-24 DIAGNOSIS — Z09 Encounter for follow-up examination after completed treatment for conditions other than malignant neoplasm: Secondary | ICD-10-CM

## 2012-09-24 DIAGNOSIS — I2510 Atherosclerotic heart disease of native coronary artery without angina pectoris: Secondary | ICD-10-CM | POA: Diagnosis not present

## 2012-09-24 DIAGNOSIS — E785 Hyperlipidemia, unspecified: Secondary | ICD-10-CM

## 2012-09-24 DIAGNOSIS — B356 Tinea cruris: Secondary | ICD-10-CM

## 2012-09-24 DIAGNOSIS — R197 Diarrhea, unspecified: Secondary | ICD-10-CM

## 2012-09-24 DIAGNOSIS — R0989 Other specified symptoms and signs involving the circulatory and respiratory systems: Secondary | ICD-10-CM | POA: Diagnosis not present

## 2012-09-24 DIAGNOSIS — I10 Essential (primary) hypertension: Secondary | ICD-10-CM | POA: Diagnosis not present

## 2012-09-24 DIAGNOSIS — E876 Hypokalemia: Secondary | ICD-10-CM | POA: Insufficient documentation

## 2012-09-24 LAB — BASIC METABOLIC PANEL WITH GFR
BUN: 17 mg/dL (ref 6–23)
Calcium: 8.4 mg/dL (ref 8.4–10.5)
Creat: 1.17 mg/dL (ref 0.50–1.35)
GFR, Est African American: 69 mL/min
GFR, Est Non African American: 60 mL/min

## 2012-09-24 MED ORDER — CLOTRIMAZOLE-BETAMETHASONE 1-0.05 % EX CREA: TOPICAL_CREAM | CUTANEOUS | Status: DC

## 2012-09-24 MED ORDER — AMLODIPINE BESYLATE 2.5 MG PO TABS: 2.5000 mg | ORAL_TABLET | Freq: Every day | ORAL | Status: DC

## 2012-09-24 MED ORDER — DIPHENOXYLATE-ATROPINE 2.5-0.025 MG PO TABS: 1.0000 | ORAL_TABLET | Freq: Four times a day (QID) | ORAL | Status: DC | PRN

## 2012-09-24 NOTE — Progress Notes (Signed)
CC: Carlos Harrington is a 79 y.o. male is here for hospital f/u   Subjective: HPI:  Follow up from [name] and admitted in September 16-18 for chest pain.   Chest pain: Patient reports date of admission he was having subjectively reported "indigestion" in the right central chest which would last approximately one hour. He had 2 episodes on the second episode he was experiencing 10 out of 10 pain he called EMS and was transferred to the emergency room. Pain was alleviated seconds after nitroglycerin patch was applied. He's never had this type of chest pain before. While admitted there was no more episodes of chest pain. Workup included negative cardiac enzymes throughout his stay, a negative nuclear stress test, a resting echocardiogram 45-50% EF with no major changes compared to evaluation 2 years ago.  Of note his blood pressure was reportedly 220/120 on presentation to the ED. Blood pressure was well-controlled after starting amlodipine 10 mg. He currently denies any exertional chest pain, chest discomfort, shortness of breath, irregular heartbeat or epigastric pain  While hospitalized a right carotid bruit was observed carotid ultrasound revealed no stenosis.  While hospitalized he had a very mild hypokalemia that required potassium supplementation on the day of discharge. He is no longer taking any potassium but has taken this in the past based on chart review.  History of coronary artery disease with remote stenting: He was discharged on 81 mg aspirin daily, sublingual nitroglycerin which he has certainly not needed, Lipitor 80 mg, metoprolol.  Hypertension: He is unsure when his blood pressure was last checked since his last visit with [place] in 2012. Since he's been home he has only been taking metoprolol 500 mg twice a day, ramipril 10 mg twice a day, doxazosin 2 mg, however he has not started his amlodipine and has not had a dose of this in 5 days. Blood pressures at home have been  consistently below 140/90 since discharge. He felt lightheaded on Sunday noticed his blood pressure was 95/60. He had a tremor on Monday in the afternoon however admits to not eating anything since breakfast this resolved with a sugar cookie. He reports lightheadedness he stands up quickly since discharge.  Patient complains of groin itching is been present for months to years his former provider at an outside clinic has prescribing different creams and powders however he's not sure what's been used in the past. Itching is present on a daily basis as moderate to severe in severity, notices redness in the groin but denies rashes elsewhere.  Patient complains of years of urgency when he comes to bowel habits. He will have 3 loose bowel movements a day on average, he describes only having seconds between the time of urge and having to defecate. It is uncommon for him to have accidents. Symptoms of not getting better or worse over the past years. Wife and patient were told that his external sphincter has poor 10 ever since he had a stroke, he reportedly had months of workup with a GI physician. Denies blood Or melanotic stools. Denies abdominal pain back pain nor rectal pain, fevers nor chills    Review Of Systems Outlined In HPI  Past Medical History  Diagnosis Date  . CAD (coronary artery disease)     with stenting X 4 in [place]  . Hypertension   . Depression   . Anxiety   . Hyperlipidemia   . Macular degeneration      Family History  Problem Relation Age of Onset  .  Hypertension Mother   . Hyperlipidemia Mother   . Diabetes Mother   . Other Mother     CVA  . Other Sister     brain tumor  . Other Brother     CVA     History  Substance Use Topics  . Smoking status: Never Smoker   . Smokeless tobacco: Not on file  . Alcohol Use: Yes     Comment: 2 daily     Objective: Filed Vitals:   09/24/12 1058  BP: 146/64  Pulse: 56    General: Alert and Oriented, No Acute  Distress HEENT: Pupils equal, round, reactive to light. Conjunctivae clear.   moist membranes pharynx unremarkable  Lungs: Clear to auscultation bilaterally, no wheezing/ronchi/rales.  Comfortable work of breathing. Good air movement. Cardiac: Regular rate and rhythm. Normal S1/S2.  No murmurs, rubs, nor gallops.  No carotid bruits  Abdomen: Normal bowel sounds, soft and non tender without palpable masses.Soft nontender  Extremities: No peripheral edema.  Strong peripheral pulses.  Genitourinary: Unremarkable penis without rash, nonswollen testicles without tenderness , mild redness in the groin folds with nearby excoriations  Mental Status: No depression, anxiety, nor agitation. Skin: Warm and dry.  Assessment & Plan: Carlos Harrington was seen today for hospital f/u.  Diagnoses and associated orders for this visit:  Hospital discharge follow-up  CAD  Essential hypertension, benign  HYPERLIPIDEMIA - BASIC METABOLIC PANEL WITH GFR  Right carotid bruit  Chest pain  Hypokalemia  Diarrhea  Tinea cruris  Other Orders - clotrimazole-betamethasone (LOTRISONE) cream; Apply to affected area twice a day for two weeks, may require four weeks if involving the feet/toes. - diphenoxylate-atropine (LOMOTIL) 2.5-0.025 MG per tablet; Take 1 tablet by mouth 4 (four) times daily as needed for diarrhea or loose stools. - amLODipine (NORVASC) 2.5 MG tablet; Take 1 tablet (2.5 mg total) by mouth daily.    Coronary artery disease: Stable continue aspirin, Lipitor, metoprolol, he is followup with Dr. Leeann Must in 2 months Essential hypertension: Controlled, since his blood pressures are well controlled currently and he has not started amlodipine 10 mg We will start at 2.5 instead with recheck in 3-4 weeks. Right carotid bruit: Ultrasound reassuring, we'll follow clinically Hypokalemia: Uncontrolled checking basic metabolic panel today Diarrhea: Uncontrolled trial of Lomotil   tinea cruris start clotrimazole  betamethasone  Chest pain: Resolved  40 minutes spent face-to-face during visit today of which at least 50% was counseling or coordinating care regarding chest pain, coronary artery disease, essential hypertension, hyperlipidemia, right carotid bruit, hypokalemia, diarrhea, tinea    Return in about 4 weeks (around 10/22/2012) for BP FU.

## 2012-09-26 DIAGNOSIS — H5210 Myopia, unspecified eye: Secondary | ICD-10-CM | POA: Diagnosis not present

## 2012-09-26 DIAGNOSIS — H33329 Round hole, unspecified eye: Secondary | ICD-10-CM | POA: Diagnosis not present

## 2012-09-26 DIAGNOSIS — H2589 Other age-related cataract: Secondary | ICD-10-CM | POA: Diagnosis not present

## 2012-09-26 DIAGNOSIS — H40039 Anatomical narrow angle, unspecified eye: Secondary | ICD-10-CM | POA: Diagnosis not present

## 2012-09-26 DIAGNOSIS — H348392 Tributary (branch) retinal vein occlusion, unspecified eye, stable: Secondary | ICD-10-CM | POA: Diagnosis not present

## 2012-09-26 DIAGNOSIS — H35379 Puckering of macula, unspecified eye: Secondary | ICD-10-CM | POA: Diagnosis not present

## 2012-09-26 DIAGNOSIS — H3581 Retinal edema: Secondary | ICD-10-CM | POA: Diagnosis not present

## 2012-10-08 ENCOUNTER — Ambulatory Visit: Payer: Medicare Other | Admitting: Family Medicine

## 2012-10-22 ENCOUNTER — Encounter: Payer: Self-pay | Admitting: Family Medicine

## 2012-10-22 ENCOUNTER — Ambulatory Visit (INDEPENDENT_AMBULATORY_CARE_PROVIDER_SITE_OTHER): Payer: Medicare Other | Admitting: Family Medicine

## 2012-10-22 VITALS — BP 123/61 | HR 63 | Wt 209.0 lb

## 2012-10-22 DIAGNOSIS — N400 Enlarged prostate without lower urinary tract symptoms: Secondary | ICD-10-CM | POA: Diagnosis not present

## 2012-10-22 DIAGNOSIS — R7309 Other abnormal glucose: Secondary | ICD-10-CM | POA: Diagnosis not present

## 2012-10-22 DIAGNOSIS — I10 Essential (primary) hypertension: Secondary | ICD-10-CM | POA: Diagnosis not present

## 2012-10-22 DIAGNOSIS — Z23 Encounter for immunization: Secondary | ICD-10-CM | POA: Diagnosis not present

## 2012-10-22 DIAGNOSIS — R739 Hyperglycemia, unspecified: Secondary | ICD-10-CM

## 2012-10-22 MED ORDER — TAMSULOSIN HCL 0.4 MG PO CAPS: 0.4000 mg | ORAL_CAPSULE | Freq: Every day | ORAL | Status: DC

## 2012-10-22 NOTE — Progress Notes (Signed)
CC: Carlos Harrington is a 79 y.o. male is here for Hypertension   Subjective: HPI:  Followup essential hypertension: Patient was encouraged to start amlodipine 2.5 mg at his last visit, instead he filled 5 mg formulation given to him by his hospital stay, continues on doxazosin, metoprolol, ramipril. Blood pressures at home have been ranging in the prehypertensive state. In both arms. He denies lightheadedness, headache, chest pain, shortness of breath, nor peripheral edema. Denies constipation  Patient's major complaint today is frequent urination that has been present for over 2 years. He describes he urinates at least once every hour. He will wake every 2-3 hours to urinate. He denies dysuria, hesitancy, urgency nor blood or discoloration of the urine. He was once told he had enlarged prostate he reports prostate biopsy 2012 were 21 samples were benign appearing. He does not believe he has ever been on medication for his prostate other than doxazosin.     Review Of Systems Outlined In HPI  Past Medical History  Diagnosis Date  . CAD (coronary artery disease)     with stenting X 4 in [place]  . Hypertension   . Depression   . Anxiety   . Hyperlipidemia   . Macular degeneration      Family History  Problem Relation Age of Onset  . Hypertension Mother   . Hyperlipidemia Mother   . Diabetes Mother   . Other Mother     CVA  . Other Sister     brain tumor  . Other Brother     CVA     History  Substance Use Topics  . Smoking status: Never Smoker   . Smokeless tobacco: Not on file  . Alcohol Use: Yes     Comment: 2 daily     Objective: Filed Vitals:   10/22/12 1315  BP: 123/61  Pulse: 63    General: Alert and Oriented, No Acute Distress HEENT: Pupils equal, round, reactive to light. Conjunctivae clear.  Moist mucous membranes Lungs: Clear to auscultation bilaterally, no wheezing/ronchi/rales.  Comfortable work of breathing. Good air movement. Cardiac: Regular rate and rhythm.  Normal S1/S2.  No murmurs, rubs, nor gallops.  No carotid bruit Extremities: No peripheral edema.  Strong peripheral pulses.  Mental Status: No depression, anxiety, nor agitation. Skin: Warm and dry.  Assessment & Plan: Juancarlos was seen today for hypertension.  Diagnoses and associated orders for this visit:  Essential hypertension, benign  Hyperglycemia  BPH (benign prostatic hyperplasia) - tamsulosin (FLOMAX) 0.4 MG CAPS capsule; Take 1 capsule (0.4 mg total) by mouth daily.  Other Orders - Cancel: Hemoglobin A1c    Essential hypertension: Since he is normotensive today we will continue 5 mg amlodipine along with all his previous medications. Essential hypertension is controlled BPH: Uncontrolled chronic condition, we will start Flomax. Patient to hold off on further hyperglycemia workup would prefer to see Flomax response first  Return in about 4 weeks (around 11/19/2012) for Prostate check up.

## 2012-11-19 DIAGNOSIS — I428 Other cardiomyopathies: Secondary | ICD-10-CM | POA: Diagnosis not present

## 2012-11-19 DIAGNOSIS — Z9861 Coronary angioplasty status: Secondary | ICD-10-CM | POA: Diagnosis not present

## 2012-11-19 DIAGNOSIS — Z8673 Personal history of transient ischemic attack (TIA), and cerebral infarction without residual deficits: Secondary | ICD-10-CM | POA: Diagnosis not present

## 2012-11-19 DIAGNOSIS — I10 Essential (primary) hypertension: Secondary | ICD-10-CM | POA: Diagnosis not present

## 2012-11-19 DIAGNOSIS — I252 Old myocardial infarction: Secondary | ICD-10-CM | POA: Diagnosis not present

## 2012-11-19 DIAGNOSIS — G4733 Obstructive sleep apnea (adult) (pediatric): Secondary | ICD-10-CM | POA: Diagnosis not present

## 2012-11-19 DIAGNOSIS — I2510 Atherosclerotic heart disease of native coronary artery without angina pectoris: Secondary | ICD-10-CM | POA: Diagnosis not present

## 2012-11-26 ENCOUNTER — Encounter: Payer: Self-pay | Admitting: Family Medicine

## 2012-11-26 ENCOUNTER — Ambulatory Visit (INDEPENDENT_AMBULATORY_CARE_PROVIDER_SITE_OTHER): Payer: Medicare Other | Admitting: Family Medicine

## 2012-11-26 VITALS — BP 149/63 | HR 45 | Wt 209.0 lb

## 2012-11-26 DIAGNOSIS — B356 Tinea cruris: Secondary | ICD-10-CM

## 2012-11-26 DIAGNOSIS — R739 Hyperglycemia, unspecified: Secondary | ICD-10-CM

## 2012-11-26 DIAGNOSIS — R7309 Other abnormal glucose: Secondary | ICD-10-CM

## 2012-11-26 DIAGNOSIS — I10 Essential (primary) hypertension: Secondary | ICD-10-CM

## 2012-11-26 MED ORDER — DOXAZOSIN MESYLATE 4 MG PO TABS: 4.0000 mg | ORAL_TABLET | Freq: Every day | ORAL | Status: DC

## 2012-11-26 MED ORDER — CLOTRIMAZOLE-BETAMETHASONE 1-0.05 % EX CREA: TOPICAL_CREAM | CUTANEOUS | Status: AC

## 2012-11-26 NOTE — Progress Notes (Signed)
CC: Carlos Harrington is a 80 y.o. male is here for prostate check up   Subjective: HPI:  BPH with obstruction followup: Since starting Flomax symptoms of polyuria have drastically improved quality of life is significantly improved. He is awakening one-2 times a night to urinate. He is urinating every 3-4 hours during the daytime. Denies polyuria, polyphagia weak urine stream, urinary hesitancy, nor sensation of incomplete voiding.    Patient was noted to have hyperglycemia nonfasting on metabolic panel in September. He denies polyuria polyphagia polydipsia nor poorly healing wounds. He does admit that vision has become more blurry over the past 3-6 months present all hours of the day nothing makes better or worse. Vision loss described as moderate in severity  Complains of rash in both groin folds. Present for 6 months at least. Present on a daily basis improves with moisturizing cream worse when moisturizing creams not applied. Regions are red and have a burning component as moderate in severity. Denies skin changes elsewhere  Review Of Systems Outlined In HPI  Past Medical History  Diagnosis Date  . CAD (coronary artery disease)     with stenting X 4 in [place]  . Hypertension   . Depression   . Anxiety   . Hyperlipidemia   . Macular degeneration      Family History  Problem Relation Age of Onset  . Hypertension Mother   . Hyperlipidemia Mother   . Diabetes Mother   . Other Mother     CVA  . Other Sister     brain tumor  . Other Brother     CVA     History  Substance Use Topics  . Smoking status: Never Smoker   . Smokeless tobacco: Not on file  . Alcohol Use: Yes     Comment: 2 daily     Objective: Filed Vitals:   11/26/12 1311  BP: 149/63  Pulse: 45    General: Alert and Oriented, No Acute Distress HEENT: Pupils equal, round, reactive to light. Conjunctivae clear.  Moist membranes pharynx unremarkable Lungs: Clear to auscultation bilaterally, no wheezing/ronchi/rales.   Comfortable work of breathing. Good air movement. Cardiac: Regular rate and rhythm. Normal S1/S2.  No murmurs, rubs, nor gallops.   Genitourinary: Unremarkable scrotum other than lateral moderate erythema with similar appearance of erythema in the approximating proximal medial thigh Extremities: No peripheral edema.  Strong peripheral pulses.  Mental Status: No depression, anxiety, nor agitation. Skin: Warm and dry.  Assessment & Plan: Carlos Harrington was seen today for prostate check up.  Diagnoses and associated orders for this visit:  Essential hypertension, benign - doxazosin (CARDURA) 4 MG tablet; Take 1 tablet (4 mg total) by mouth at bedtime.  Hyperglycemia - HgB A1c  Tinea cruris - clotrimazole-betamethasone (LOTRISONE) cream; Apply to affected area twice a day for two weeks, may require four weeks if involving the feet/toes.    Tinea cruris: Start Lotrisone daily basis the next 2 weeks Hyperglycemia: Given his vision deterioration there is a high concern that he may have developed type 2 diabetes check an A1c Essential hypertension: Uncontrolled, increasing doxazosin BPH: Improved, still room for improvement hopefully doxazosin will make a substantial difference  Return in about 4 weeks (around 12/24/2012).

## 2012-11-27 ENCOUNTER — Encounter: Payer: Self-pay | Admitting: Family Medicine

## 2012-11-27 DIAGNOSIS — R7303 Prediabetes: Secondary | ICD-10-CM | POA: Insufficient documentation

## 2012-11-27 LAB — HEMOGLOBIN A1C
Hgb A1c MFr Bld: 5.9 % — ABNORMAL HIGH (ref <5.7)
Mean Plasma Glucose: 123 mg/dL — ABNORMAL HIGH (ref <117)

## 2012-12-17 ENCOUNTER — Ambulatory Visit (INDEPENDENT_AMBULATORY_CARE_PROVIDER_SITE_OTHER): Payer: Medicare Other | Admitting: Family Medicine

## 2012-12-17 ENCOUNTER — Encounter: Payer: Self-pay | Admitting: Family Medicine

## 2012-12-17 VITALS — BP 161/74 | HR 49 | Wt 213.0 lb

## 2012-12-17 DIAGNOSIS — I10 Essential (primary) hypertension: Secondary | ICD-10-CM | POA: Diagnosis not present

## 2012-12-17 DIAGNOSIS — N400 Enlarged prostate without lower urinary tract symptoms: Secondary | ICD-10-CM

## 2012-12-17 MED ORDER — AMLODIPINE BESYLATE 10 MG PO TABS: 10.0000 mg | ORAL_TABLET | Freq: Every day | ORAL | Status: DC

## 2012-12-17 NOTE — Progress Notes (Signed)
CC: Carlos Harrington is a 79 y.o. male is here for Hypertension   Subjective: HPI:  Followup essential hypertension: Continues on amlodipine, doxazosin, metoprolol, ramipril. No outside blood pressures to report. Denies chest pain shortness of breath orthopnea peripheral edema irregular heart beat nor known side effects  Followup BPH: Since increasing the doxazosin at his last visit he states that he is urinating much more what he would consider normal, urinating 4-5 times a day once at night and denies weak stream, incomplete voiding, nor urinary urgency or hesitancy.   Review Of Systems Outlined In HPI  Past Medical History  Diagnosis Date  . CAD (coronary artery disease)     with stenting X 4 in [place]  . Hypertension   . Depression   . Anxiety   . Hyperlipidemia   . Macular degeneration      Family History  Problem Relation Age of Onset  . Hypertension Mother   . Hyperlipidemia Mother   . Diabetes Mother   . Other Mother     CVA  . Other Sister     brain tumor  . Other Brother     CVA     History  Substance Use Topics  . Smoking status: Never Smoker   . Smokeless tobacco: Not on file  . Alcohol Use: Yes     Comment: 2 daily     Objective: Filed Vitals:   12/17/12 1315  BP: 161/74  Pulse: 49    General: Alert and Oriented, No Acute Distress HEENT: Pupils equal, round, reactive to light. Conjunctivae clear.  Moist the December and pharynx unremarkable Lungs: Clear to auscultation bilaterally, no wheezing/ronchi/rales.  Comfortable work of breathing. Good air movement. Cardiac: Regular rate and rhythm. Normal S1/S2.  No murmurs, rubs, nor gallops.   Extremities: No peripheral edema.  Strong peripheral pulses.  Mental Status: No depression, anxiety, nor agitation. Skin: Warm and dry.  Assessment & Plan: Carlos Harrington was seen today for hypertension.  Diagnoses and associated orders for this visit:  Essential hypertension, benign - amLODipine (NORVASC) 10 MG tablet; Take  1 tablet (10 mg total) by mouth daily.  BPH (benign prostatic hyperplasia)    Essential hypertension: Chronic uncontrolled condition increasing amlodipine BPH: Improving and under control continue Flomax and doxazosin   Return in about 4 weeks (around 01/14/2013).

## 2012-12-30 DIAGNOSIS — H2589 Other age-related cataract: Secondary | ICD-10-CM | POA: Diagnosis not present

## 2012-12-30 DIAGNOSIS — Z01818 Encounter for other preprocedural examination: Secondary | ICD-10-CM | POA: Diagnosis not present

## 2013-01-09 ENCOUNTER — Other Ambulatory Visit: Payer: Self-pay | Admitting: *Deleted

## 2013-01-09 DIAGNOSIS — I498 Other specified cardiac arrhythmias: Secondary | ICD-10-CM | POA: Diagnosis not present

## 2013-01-09 DIAGNOSIS — H268 Other specified cataract: Secondary | ICD-10-CM | POA: Diagnosis not present

## 2013-01-09 DIAGNOSIS — Z7982 Long term (current) use of aspirin: Secondary | ICD-10-CM | POA: Diagnosis not present

## 2013-01-09 DIAGNOSIS — H2510 Age-related nuclear cataract, unspecified eye: Secondary | ICD-10-CM | POA: Diagnosis not present

## 2013-01-09 DIAGNOSIS — I2510 Atherosclerotic heart disease of native coronary artery without angina pectoris: Secondary | ICD-10-CM | POA: Diagnosis not present

## 2013-01-09 DIAGNOSIS — Z8673 Personal history of transient ischemic attack (TIA), and cerebral infarction without residual deficits: Secondary | ICD-10-CM | POA: Diagnosis not present

## 2013-01-09 DIAGNOSIS — H2181 Floppy iris syndrome: Secondary | ICD-10-CM | POA: Diagnosis not present

## 2013-01-09 DIAGNOSIS — N189 Chronic kidney disease, unspecified: Secondary | ICD-10-CM | POA: Diagnosis not present

## 2013-01-09 DIAGNOSIS — I129 Hypertensive chronic kidney disease with stage 1 through stage 4 chronic kidney disease, or unspecified chronic kidney disease: Secondary | ICD-10-CM | POA: Diagnosis not present

## 2013-01-09 DIAGNOSIS — I252 Old myocardial infarction: Secondary | ICD-10-CM | POA: Diagnosis not present

## 2013-01-09 DIAGNOSIS — Z6833 Body mass index (BMI) 33.0-33.9, adult: Secondary | ICD-10-CM | POA: Diagnosis not present

## 2013-01-09 DIAGNOSIS — H9190 Unspecified hearing loss, unspecified ear: Secondary | ICD-10-CM | POA: Diagnosis not present

## 2013-01-09 DIAGNOSIS — H2589 Other age-related cataract: Secondary | ICD-10-CM | POA: Diagnosis not present

## 2013-01-09 DIAGNOSIS — M129 Arthropathy, unspecified: Secondary | ICD-10-CM | POA: Diagnosis not present

## 2013-01-09 DIAGNOSIS — G4733 Obstructive sleep apnea (adult) (pediatric): Secondary | ICD-10-CM | POA: Diagnosis not present

## 2013-01-09 DIAGNOSIS — E669 Obesity, unspecified: Secondary | ICD-10-CM | POA: Diagnosis not present

## 2013-01-09 MED ORDER — RAMIPRIL 10 MG PO CAPS: 10.0000 mg | ORAL_CAPSULE | Freq: Two times a day (BID) | ORAL | Status: DC

## 2013-01-09 NOTE — Progress Notes (Signed)
Refill request for ramipril came through from [facility].

## 2013-01-14 ENCOUNTER — Ambulatory Visit: Payer: Medicare Other | Admitting: Family Medicine

## 2013-01-14 ENCOUNTER — Encounter: Payer: Self-pay | Admitting: Family Medicine

## 2013-01-14 ENCOUNTER — Ambulatory Visit (INDEPENDENT_AMBULATORY_CARE_PROVIDER_SITE_OTHER): Payer: Medicare Other | Admitting: Family Medicine

## 2013-01-14 VITALS — BP 120/61 | HR 57 | Wt 212.0 lb

## 2013-01-14 DIAGNOSIS — I10 Essential (primary) hypertension: Secondary | ICD-10-CM | POA: Diagnosis not present

## 2013-01-14 DIAGNOSIS — K5900 Constipation, unspecified: Secondary | ICD-10-CM | POA: Diagnosis not present

## 2013-01-14 DIAGNOSIS — R252 Cramp and spasm: Secondary | ICD-10-CM | POA: Diagnosis not present

## 2013-01-14 NOTE — Progress Notes (Signed)
CC: Carlos Harrington is a 80 y.o. male is here for Hypertension   Subjective: HPI:  Followup essential hypertension: At his last visit we increased amlodipine now taking 10 mg daily continues on doxazosin, metoprolol, ramipril. No outside blood pressures to report. Denies known side effects of medications however on direct questioning does admit to decreased stooling over the past 2 weeks. This is been described as stooling volume somewhere around a quarter of what he typically moves on a daily basis. Denies straining or pain with defecation. Does report subjective constipation however.  He eats a high fiber diet for breakfast estimates that he drinks one glass of water a day very little fluids throughout the rest of the day.  Complains of a cramp in his left quadriceps that occurred late last night while sleeping. It awoke him from his sleep the cramp cause pain to a degree where he had difficulty walking and still has pain with knee extension. Denies weakness he is worried that he may have had a stroke which caused cramp.  Pain is localized only in the quadricep with palpation or knee extension. Denies weakness or sensory disturbance in the left lower extremity. Denies any motor or sensory disturbances overall   Review Of Systems Outlined In HPI  Past Medical History  Diagnosis Date  . CAD (coronary artery disease)     with stenting X 4 in [place]  . Hypertension   . Depression   . Anxiety   . Hyperlipidemia   . Macular degeneration      Family History  Problem Relation Age of Onset  . Hypertension Mother   . Hyperlipidemia Mother   . Diabetes Mother   . Other Mother     CVA  . Other Sister     brain tumor  . Other Brother     CVA     History  Substance Use Topics  . Smoking status: Never Smoker   . Smokeless tobacco: Not on file  . Alcohol Use: Yes     Comment: 2 daily     Objective: Filed Vitals:   01/14/13 1346  BP: 120/61  Pulse: 57    General: Alert and Oriented, No  Acute Distress HEENT: Pupils equal, round, reactive to light. Conjunctivae clear.  Moist mucous membranes pharynx unremarkable Lungs: Clear to auscultation bilaterally, no wheezing/ronchi/rales.  Comfortable work of breathing. Good air movement. Cardiac: Regular rate and rhythm. Normal S1/S2.  No murmurs, rubs, nor gallops.   Abdomen: Obese soft nontender normal bowel sounds Extremities: No peripheral edema.  Strong peripheral pulses. Full range of motion strength in both lower extremities with L4 and S1 DTRs two over four bilaterally light touch sensation intact in all dermatomes L1-S1, vibratory sensation is preserved in the lower extremities Mental Status: No depression, anxiety, nor agitation. Skin: Warm and dry.  Assessment & Plan: Macrae was seen today for hypertension.  Diagnoses and associated orders for this visit:  Essential hypertension, benign  Cramp in limb  Constipation    Essential hypertension: Controlled continue current regimen of antihypertensives Constipation: Likely multifactorial due to amlodipine use and decreased fluid intake. I've encouraged him to continue with his bran flakes fiber breakfast however strive for 8 8 ounce glasses of water a day spaced out throughout the day to help with constipation if not improved call next week for over-the-counter recommendations Cramp in limb: Discussed hydration above should help tremendously and could consider using quinine before bed to avoid future cramps I provided him with reassurance it is  very unlikely at stroke causing the cramp   Return in about 3 months (around 04/14/2013).

## 2013-01-15 DIAGNOSIS — H33329 Round hole, unspecified eye: Secondary | ICD-10-CM | POA: Diagnosis not present

## 2013-01-15 DIAGNOSIS — H3581 Retinal edema: Secondary | ICD-10-CM | POA: Diagnosis not present

## 2013-01-15 DIAGNOSIS — H35379 Puckering of macula, unspecified eye: Secondary | ICD-10-CM | POA: Diagnosis not present

## 2013-01-15 DIAGNOSIS — H5210 Myopia, unspecified eye: Secondary | ICD-10-CM | POA: Diagnosis not present

## 2013-01-15 DIAGNOSIS — H40039 Anatomical narrow angle, unspecified eye: Secondary | ICD-10-CM | POA: Diagnosis not present

## 2013-01-15 DIAGNOSIS — H2589 Other age-related cataract: Secondary | ICD-10-CM | POA: Diagnosis not present

## 2013-01-15 DIAGNOSIS — H348392 Tributary (branch) retinal vein occlusion, unspecified eye, stable: Secondary | ICD-10-CM | POA: Diagnosis not present

## 2013-02-19 DIAGNOSIS — H35359 Cystoid macular degeneration, unspecified eye: Secondary | ICD-10-CM | POA: Diagnosis not present

## 2013-02-19 DIAGNOSIS — Z9849 Cataract extraction status, unspecified eye: Secondary | ICD-10-CM | POA: Diagnosis not present

## 2013-02-19 DIAGNOSIS — H35379 Puckering of macula, unspecified eye: Secondary | ICD-10-CM | POA: Diagnosis not present

## 2013-02-19 DIAGNOSIS — H526 Other disorders of refraction: Secondary | ICD-10-CM | POA: Diagnosis not present

## 2013-02-19 DIAGNOSIS — H2510 Age-related nuclear cataract, unspecified eye: Secondary | ICD-10-CM | POA: Diagnosis not present

## 2013-02-26 ENCOUNTER — Ambulatory Visit (INDEPENDENT_AMBULATORY_CARE_PROVIDER_SITE_OTHER): Payer: Medicare Other | Admitting: Family Medicine

## 2013-02-26 ENCOUNTER — Encounter: Payer: Self-pay | Admitting: Family Medicine

## 2013-02-26 VITALS — BP 148/80 | HR 102 | Wt 215.0 lb

## 2013-02-26 DIAGNOSIS — B379 Candidiasis, unspecified: Secondary | ICD-10-CM

## 2013-02-26 MED ORDER — CLOTRIMAZOLE 1 % EX CREA
TOPICAL_CREAM | CUTANEOUS | Status: AC
Start: 2013-02-26 — End: 2013-03-28

## 2013-02-26 MED ORDER — FLUCONAZOLE 100 MG PO TABS
100.0000 mg | ORAL_TABLET | Freq: Every day | ORAL | Status: DC
Start: 2013-02-26 — End: 2013-05-28

## 2013-02-26 NOTE — Progress Notes (Signed)
CC: Carlos Harrington is a 80 y.o. male is here for rash in the groin   Subjective: HPI:  Complains a rash in the groin that has been present for a little over 2 months however it only seems to affect one side of the groin at a time. It is painful moderate severity worse with walking or sitting initially itchy however this has resolved. Rash extends between the legs but not to the anus. Interventions have involved clotrimazole/betamethasone which will temporarily resolve the rash, he is only apply in this to one side of the groin at that time the side it is not receiving medication will slowly developed a rash until he begins to treat it. Denies skin changes elsewhere. Denies fevers, chills, flushing, nor genitourinary complaints   Review Of Systems Outlined In HPI  Past Medical History  Diagnosis Date  . CAD (coronary artery disease)     with stenting X 4 in [place]  . Hypertension   . Depression   . Anxiety   . Hyperlipidemia   . Macular degeneration     Past Surgical History  Procedure Laterality Date  . Appendectomy    . Cholecystectomy    . Tonsillectomy     Family History  Problem Relation Age of Onset  . Hypertension Mother   . Hyperlipidemia Mother   . Diabetes Mother   . Other Mother     CVA  . Other Sister     brain tumor  . Other Brother     CVA    History   Social History  . Marital Status: Married    Spouse Name: N/A    Number of Children: N/A  . Years of Education: N/A   Occupational History  . Not on file.   Social History Main Topics  . Smoking status: Never Smoker   . Smokeless tobacco: Not on file  . Alcohol Use: Yes     Comment: 2 daily  . Drug Use:   . Sexual Activity:    Other Topics Concern  . Not on file   Social History Narrative  . No narrative on file     Objective: BP 148/80  Pulse 102  Wt 215 lb (97.523 kg)  SpO2 95%  Vital signs reviewed. General: Alert and Oriented, No Acute Distress HEENT: Pupils equal, round, reactive to  light. Conjunctivae clear.  External ears unremarkable.  Moist mucous membranes. Lungs: Clear and comfortable work of breathing, speaking in full sentences without accessory muscle use. Cardiac: Regular rate and rhythm.  Extremities: No peripheral edema.  Strong peripheral pulses.  Mental Status: No depression, anxiety, nor agitation. Logical though process. Skin: Warm and dry. The left scrotum and inguinal fold is moderately erythematous with satellite lesions tender to the touch slightly malodorous and quite moist  Assessment & Plan: Carlos Harrington was seen today for rash in the groin.  Diagnoses and associated orders for this visit:  Candida infection - clotrimazole (LOTRIMIN) 1 % cream; Apply to affected areas twice a day for up to four weeks, applying up to two weeks after resolution of symptoms. - fluconazole (DIFLUCAN) 100 MG tablet; Take 1 tablet (100 mg total) by mouth daily.    Discussed with patient I believe this is a persistent candidal infection that has not been completely treated due to only unilateral treatment, I like to be aggressive with a burst of fluconazole and then using clotrimazole in entire groin and scrotal area even in areas where there is no infection for the next 2-4  weeks.   Return if symptoms worsen or fail to improve.

## 2013-03-13 ENCOUNTER — Telehealth: Payer: Self-pay | Admitting: Family Medicine

## 2013-03-13 MED ORDER — METOPROLOL TARTRATE 50 MG PO TABS: 25.0000 mg | ORAL_TABLET | Freq: Two times a day (BID) | ORAL | Status: DC

## 2013-03-13 NOTE — Telephone Encounter (Signed)
[facility] refill

## 2013-03-19 DIAGNOSIS — H35359 Cystoid macular degeneration, unspecified eye: Secondary | ICD-10-CM | POA: Diagnosis not present

## 2013-03-19 DIAGNOSIS — H35379 Puckering of macula, unspecified eye: Secondary | ICD-10-CM | POA: Diagnosis not present

## 2013-03-19 DIAGNOSIS — Z9849 Cataract extraction status, unspecified eye: Secondary | ICD-10-CM | POA: Diagnosis not present

## 2013-03-19 DIAGNOSIS — H2510 Age-related nuclear cataract, unspecified eye: Secondary | ICD-10-CM | POA: Diagnosis not present

## 2013-03-24 ENCOUNTER — Encounter: Payer: Self-pay | Admitting: Family Medicine

## 2013-03-24 ENCOUNTER — Ambulatory Visit (INDEPENDENT_AMBULATORY_CARE_PROVIDER_SITE_OTHER): Payer: Medicare Other | Admitting: Family Medicine

## 2013-03-24 VITALS — BP 169/74 | HR 57 | Temp 97.8°F | Wt 212.0 lb

## 2013-03-24 DIAGNOSIS — K219 Gastro-esophageal reflux disease without esophagitis: Secondary | ICD-10-CM

## 2013-03-24 DIAGNOSIS — B356 Tinea cruris: Secondary | ICD-10-CM

## 2013-03-24 MED ORDER — CLOTRIMAZOLE-BETAMETHASONE 1-0.05 % EX CREA: TOPICAL_CREAM | CUTANEOUS | Status: AC

## 2013-03-24 MED ORDER — OMEPRAZOLE 40 MG PO CPDR: DELAYED_RELEASE_CAPSULE | ORAL | Status: DC

## 2013-03-24 NOTE — Progress Notes (Signed)
CC: Carlos Harrington is a 80 y.o. male is here for Nasal Congestion   Subjective: HPI:  Complains of substernal discomfort that is present only soon after eating and will last up to one hour after eating. It is worse with solid foods or drinking beer. Is described as a burning sensation that comes up and down the back of the sternum. He denies any exertional component to his pain. It occurs on a daily basis with all meals now. No interventions as of yet. Moderate in severity. It does not radiate in any other direction.  Denies regurgitation, nausea, abdominal pain, shortness of breath, wheezing, exertional chest pain. Review of system is positive for clearing throat and dry cough of mild severity throughout the day  Reports the rash in the groin has significantly improved from a pain standpoint since using clotrimazole and fluconazole but itching remains. Itching is mild in severity worse in the middle the night or when taking a hot shower. Denies any redness or skin changes groin or scrotum. Denies rashes or skin complaints elsewhere   Review Of Systems Outlined In HPI  Past Medical History  Diagnosis Date  . CAD (coronary artery disease)     with stenting X 4 in [place]  . Hypertension   . Depression   . Anxiety   . Hyperlipidemia   . Macular degeneration     Past Surgical History  Procedure Laterality Date  . Appendectomy    . Cholecystectomy    . Tonsillectomy     Family History  Problem Relation Age of Onset  . Hypertension Mother   . Hyperlipidemia Mother   . Diabetes Mother   . Other Mother     CVA  . Other Sister     brain tumor  . Other Brother     CVA    History   Social History  . Marital Status: Married    Spouse Name: N/A    Number of Children: N/A  . Years of Education: N/A   Occupational History  . Not on file.   Social History Main Topics  . Smoking status: Never Smoker   . Smokeless tobacco: Not on file  . Alcohol Use: Yes     Comment: 2 daily  . Drug  Use:   . Sexual Activity:    Other Topics Concern  . Not on file   Social History Narrative  . No narrative on file     Objective: BP 169/74  Pulse 57  Temp(Src) 97.8 F (36.6 C) (Oral)  Wt 212 lb (96.163 kg)  SpO2 97%  General: Alert and Oriented, No Acute Distress HEENT: Pupils equal, round, reactive to light. Conjunctivae clear. Moist mucous membranes pharynx unremarkable Lungs: Clear to auscultation bilaterally, no wheezing/ronchi/rales.  Comfortable work of breathing. Good air movement. Cardiac: Regular rate and rhythm. Normal S1/S2.  No murmurs, rubs, nor gallops.   Extremities: No peripheral edema.  Strong peripheral pulses.  Genitourinary: Penis unremarkable scrotum nontender with trace erythema on the inferior posterior surface, inguinal folds now unremarkable no longer erythematous Mental Status: No depression, anxiety, nor agitation. Skin: Warm and dry.  Assessment & Plan: Carlos Harrington was seen today for nasal congestion.  Diagnoses and associated orders for this visit:  Tinea cruris - clotrimazole-betamethasone (LOTRISONE) cream; Apply to affected area twice a day for two weeks.  GERD (gastroesophageal reflux disease) - omeprazole (PRILOSEC) 40 MG capsule; One by mouth daily at least one hour before a meal.    Tinea cruris: Adding beclomethasone 2  clotrimazole regimen for the next 2 weeks due to persistent itching GERD: Start omeprazole if improvement after one week we'll continue this for up to 2 months.  Return if symptoms worsen or fail to improve.

## 2013-03-25 DIAGNOSIS — M7750 Other enthesopathy of unspecified foot: Secondary | ICD-10-CM | POA: Diagnosis not present

## 2013-03-25 DIAGNOSIS — L608 Other nail disorders: Secondary | ICD-10-CM | POA: Diagnosis not present

## 2013-03-27 DIAGNOSIS — H6120 Impacted cerumen, unspecified ear: Secondary | ICD-10-CM | POA: Diagnosis not present

## 2013-04-07 ENCOUNTER — Ambulatory Visit (INDEPENDENT_AMBULATORY_CARE_PROVIDER_SITE_OTHER): Payer: Medicare Other | Admitting: Family Medicine

## 2013-04-07 ENCOUNTER — Encounter: Payer: Self-pay | Admitting: Family Medicine

## 2013-04-07 VITALS — BP 177/88 | HR 60 | Wt 209.0 lb

## 2013-04-07 DIAGNOSIS — R079 Chest pain, unspecified: Secondary | ICD-10-CM

## 2013-04-07 DIAGNOSIS — F411 Generalized anxiety disorder: Secondary | ICD-10-CM | POA: Diagnosis not present

## 2013-04-07 MED ORDER — ESCITALOPRAM OXALATE 5 MG PO TABS: 5.0000 mg | ORAL_TABLET | Freq: Every day | ORAL | Status: DC

## 2013-04-07 NOTE — Progress Notes (Signed)
CC: Carlos Harrington is a 80 y.o. male is here for Chest Pain   Subjective: HPI:  Complaints of abdominal pain and chest pain described only as "indigestion" is of moderate severity occurring on a daily basis. Since starting omeprazole he's had a mild improvement, initially symptoms were well handle until soon after dinner it has slowly moved down to be ineffective beyond 12 noon. Pain is nonradiating, burning, does not have an exertional component.  Nothing other than above particularly makes it better or worse.  His daughter is with him today and has noticed that symptoms are directly related to psychological stress brought on by his hearing loss and his wife's poor health. She tells me that he had identical symptoms years ago that were managed with Lexapro.  Denies nausea, vomiting, unintentional weight loss, diarrhea, constipation, exertional chest pain, shortness of breath, nor irregular heartbeat. He does admit that his stress level is significant and frequently makes him irritable.   Review Of Systems Outlined In HPI  Past Medical History  Diagnosis Date  . CAD (coronary artery disease)     with stenting X 4 in [place]  . Hypertension   . Depression   . Anxiety   . Hyperlipidemia   . Macular degeneration     Past Surgical History  Procedure Laterality Date  . Appendectomy    . Cholecystectomy    . Tonsillectomy     Family History  Problem Relation Age of Onset  . Hypertension Mother   . Hyperlipidemia Mother   . Diabetes Mother   . Other Mother     CVA  . Other Sister     brain tumor  . Other Brother     CVA    History   Social History  . Marital Status: Married    Spouse Name: N/A    Number of Children: N/A  . Years of Education: N/A   Occupational History  . Not on file.   Social History Main Topics  . Smoking status: Never Smoker   . Smokeless tobacco: Not on file  . Alcohol Use: Yes     Comment: 2 daily  . Drug Use:   . Sexual Activity:    Other Topics  Concern  . Not on file   Social History Narrative  . No narrative on file     Objective: BP 177/88  Pulse 60  Wt 209 lb (94.802 kg)  General: Alert and Oriented, No Acute Distress HEENT: Pupils equal, round, reactive to light. Conjunctivae clear.   moist mucous membranes pharynx unremarkable  Lungs: Clear to auscultation bilaterally, no wheezing/ronchi/rales.  Comfortable work of breathing. Good air movement. Cardiac: Regular rate and rhythm. Normal S1/S2.  No murmurs, rubs, nor gallops.   Abdomen:  obese, Normal bowel sounds, soft and non tender without palpable masses. Extremities: No peripheral edema.  Strong peripheral pulses.  Mental Status: No depression, anxiety, nor agitation. Skin: Warm and dry.  Assessment & Plan: [name] was seen today for chest pain.  Diagnoses and associated orders for this visit:  Chest pain - PR ELECTROCARDIOGRAM, COMPLETE  Generalized anxiety disorder - escitalopram (LEXAPRO) 5 MG tablet; Take 1 tablet (5 mg total) by mouth daily.    EKG was obtained showing sinus bradycardia, left axis deviation, normal intervals, no pathologic Q waves, no ST segment elevation or depression. When compared to a tracing from 2011 the only differences inverted T waves in aVL aVF, leads III. Low suspicion of cardiac etiology of his abdominal pain.  Discussed  that it is likely his anxiety is contributing to his abdominal discomfort therefore restart low-dose Lexapro.  Daughter reports that blood pressure typically correlates with his anxiety level, hopefully this will improve as well and the next 4 weeks.  Return in about 4 weeks (around 05/05/2013).

## 2013-04-08 ENCOUNTER — Encounter: Payer: Self-pay | Admitting: *Deleted

## 2013-04-09 DIAGNOSIS — H40039 Anatomical narrow angle, unspecified eye: Secondary | ICD-10-CM | POA: Diagnosis not present

## 2013-04-09 DIAGNOSIS — H33329 Round hole, unspecified eye: Secondary | ICD-10-CM | POA: Diagnosis not present

## 2013-04-09 DIAGNOSIS — H3581 Retinal edema: Secondary | ICD-10-CM | POA: Diagnosis not present

## 2013-04-09 DIAGNOSIS — H2589 Other age-related cataract: Secondary | ICD-10-CM | POA: Diagnosis not present

## 2013-04-09 DIAGNOSIS — H35379 Puckering of macula, unspecified eye: Secondary | ICD-10-CM | POA: Diagnosis not present

## 2013-04-09 DIAGNOSIS — H348392 Tributary (branch) retinal vein occlusion, unspecified eye, stable: Secondary | ICD-10-CM | POA: Diagnosis not present

## 2013-04-09 DIAGNOSIS — Z9849 Cataract extraction status, unspecified eye: Secondary | ICD-10-CM | POA: Diagnosis not present

## 2013-04-15 ENCOUNTER — Ambulatory Visit: Payer: Medicare Other | Admitting: Family Medicine

## 2013-05-05 ENCOUNTER — Ambulatory Visit (INDEPENDENT_AMBULATORY_CARE_PROVIDER_SITE_OTHER): Payer: Medicare Other | Admitting: Family Medicine

## 2013-05-05 ENCOUNTER — Encounter: Payer: Self-pay | Admitting: Family Medicine

## 2013-05-05 VITALS — BP 173/71 | HR 54 | Wt 207.0 lb

## 2013-05-05 DIAGNOSIS — F411 Generalized anxiety disorder: Secondary | ICD-10-CM | POA: Diagnosis not present

## 2013-05-05 DIAGNOSIS — F419 Anxiety disorder, unspecified: Secondary | ICD-10-CM | POA: Insufficient documentation

## 2013-05-05 DIAGNOSIS — K219 Gastro-esophageal reflux disease without esophagitis: Secondary | ICD-10-CM

## 2013-05-05 MED ORDER — OMEPRAZOLE 40 MG PO CPDR: DELAYED_RELEASE_CAPSULE | ORAL | Status: DC

## 2013-05-05 NOTE — Progress Notes (Signed)
CC: Carlos Harrington is a 80 y.o. male is here for f/u anxiety   Subjective: HPI:  Followup anxiety: Has been taking Lexapro 5 mg on a daily basis. No known side effects. He believes that overall this is helping with irritability and frustration along with anxiety. Symptoms have been worsened by his wife's recent hospitalization  But overall he thinks he is improving. Denies anxiety, depression, paranoia, mental disturbance.  States that since taking Lexapro he no longer has any upset stomach, epigastric discomfort or chest pain. He is requesting refills on omeprazole.   Review Of Systems Outlined In HPI  Past Medical History  Diagnosis Date  . CAD (coronary artery disease)     with stenting X 4 in [place]  . Hypertension   . Depression   . Anxiety   . Hyperlipidemia   . Macular degeneration     Past Surgical History  Procedure Laterality Date  . Appendectomy    . Cholecystectomy    . Tonsillectomy     Family History  Problem Relation Age of Onset  . Hypertension Mother   . Hyperlipidemia Mother   . Diabetes Mother   . Other Mother     CVA  . Other Sister     brain tumor  . Other Brother     CVA    History   Social History  . Marital Status: Married    Spouse Name: N/A    Number of Children: N/A  . Years of Education: N/A   Occupational History  . Not on file.   Social History Main Topics  . Smoking status: Never Smoker   . Smokeless tobacco: Not on file  . Alcohol Use: Yes     Comment: 2 daily  . Drug Use:   . Sexual Activity:    Other Topics Concern  . Not on file   Social History Narrative  . No narrative on file     Objective: BP 173/71  Pulse 54  Wt 207 lb (93.895 kg)  Vital signs reviewed. General: Alert and Oriented, No Acute Distress HEENT: Pupils equal, round, reactive to light. Conjunctivae clear.  External ears unremarkable.  Moist mucous membranes. Lungs: Clear and comfortable work of breathing, speaking in full sentences without accessory  muscle use. Cardiac: Regular rate and rhythm.  Neuro: CN II-XII grossly intact, gait normal. Extremities: No peripheral edema.  Strong peripheral pulses.  Mental Status: No depression, anxiety, nor agitation. Logical though process. Skin: Warm and dry.  Assessment & Plan: Carlos Harrington was seen today for f/u anxiety.  Diagnoses and associated orders for this visit:  Anxiety  GERD (gastroesophageal reflux disease) - omeprazole (PRILOSEC) 40 MG capsule; One by mouth daily at least one hour before a meal.    Anxiety: Controlled and improved continue Lexapro GERD: Controlled continue omeprazole  Return if symptoms worsen or fail to improve.

## 2013-05-14 DIAGNOSIS — Z9861 Coronary angioplasty status: Secondary | ICD-10-CM | POA: Diagnosis not present

## 2013-05-14 DIAGNOSIS — R7989 Other specified abnormal findings of blood chemistry: Secondary | ICD-10-CM | POA: Diagnosis not present

## 2013-05-14 DIAGNOSIS — Z79899 Other long term (current) drug therapy: Secondary | ICD-10-CM | POA: Diagnosis not present

## 2013-05-14 DIAGNOSIS — I498 Other specified cardiac arrhythmias: Secondary | ICD-10-CM | POA: Diagnosis not present

## 2013-05-14 DIAGNOSIS — R748 Abnormal levels of other serum enzymes: Secondary | ICD-10-CM | POA: Diagnosis not present

## 2013-05-14 DIAGNOSIS — Z9089 Acquired absence of other organs: Secondary | ICD-10-CM | POA: Diagnosis not present

## 2013-05-14 DIAGNOSIS — I10 Essential (primary) hypertension: Secondary | ICD-10-CM | POA: Diagnosis not present

## 2013-05-14 DIAGNOSIS — R079 Chest pain, unspecified: Secondary | ICD-10-CM | POA: Diagnosis not present

## 2013-05-14 DIAGNOSIS — Z7982 Long term (current) use of aspirin: Secondary | ICD-10-CM | POA: Diagnosis not present

## 2013-05-14 DIAGNOSIS — I2510 Atherosclerotic heart disease of native coronary artery without angina pectoris: Secondary | ICD-10-CM | POA: Diagnosis not present

## 2013-05-14 DIAGNOSIS — E785 Hyperlipidemia, unspecified: Secondary | ICD-10-CM | POA: Diagnosis not present

## 2013-05-14 DIAGNOSIS — I69998 Other sequelae following unspecified cerebrovascular disease: Secondary | ICD-10-CM | POA: Diagnosis not present

## 2013-05-14 DIAGNOSIS — R29898 Other symptoms and signs involving the musculoskeletal system: Secondary | ICD-10-CM | POA: Diagnosis not present

## 2013-05-15 ENCOUNTER — Telehealth: Payer: Self-pay | Admitting: Family Medicine

## 2013-05-15 MED ORDER — DIAZEPAM 5 MG PO TABS: 2.5000 mg | ORAL_TABLET | Freq: Two times a day (BID) | ORAL | Status: DC | PRN

## 2013-05-15 NOTE — Telephone Encounter (Signed)
Pt's daughter notified and rx faxed

## 2013-05-15 NOTE — Telephone Encounter (Signed)
Pts daughter called and states that Carlos Harrington is not doing well in the hospital and they didn't expect her to make it through the night, but she is hanging on, MEANWHILE Carlos Harrington started having Chest Pain and High anxiety and was seen in the ER, but refused admission because he don't want to leave his wife's side. Daughter is requesting you call something in for Carlos Harrington's nerves. Can you send to his pharmacy and let daughter- Carlos Harrington know.Marland KitchenMarland KitchenThanks, [employer]

## 2013-05-15 NOTE — Telephone Encounter (Signed)
Carlos Harrington, Rx placed in in-box ready for pickup/faxing.  

## 2013-05-15 NOTE — Addendum Note (Signed)
Addended by: Marcial Pacas on: 05/15/2013 12:49 PM   Modules accepted: Orders

## 2013-05-16 ENCOUNTER — Ambulatory Visit: Payer: Medicare Other | Admitting: Family Medicine

## 2013-05-21 DIAGNOSIS — I214 Non-ST elevation (NSTEMI) myocardial infarction: Secondary | ICD-10-CM | POA: Diagnosis not present

## 2013-05-21 DIAGNOSIS — I517 Cardiomegaly: Secondary | ICD-10-CM | POA: Diagnosis not present

## 2013-05-21 DIAGNOSIS — Z9861 Coronary angioplasty status: Secondary | ICD-10-CM | POA: Diagnosis not present

## 2013-05-21 DIAGNOSIS — R6889 Other general symptoms and signs: Secondary | ICD-10-CM | POA: Diagnosis not present

## 2013-05-21 DIAGNOSIS — R079 Chest pain, unspecified: Secondary | ICD-10-CM | POA: Diagnosis not present

## 2013-05-21 DIAGNOSIS — IMO0002 Reserved for concepts with insufficient information to code with codable children: Secondary | ICD-10-CM | POA: Diagnosis not present

## 2013-05-21 DIAGNOSIS — N183 Chronic kidney disease, stage 3 unspecified: Secondary | ICD-10-CM | POA: Diagnosis not present

## 2013-05-21 DIAGNOSIS — I2510 Atherosclerotic heart disease of native coronary artery without angina pectoris: Secondary | ICD-10-CM | POA: Diagnosis not present

## 2013-05-21 DIAGNOSIS — I129 Hypertensive chronic kidney disease with stage 1 through stage 4 chronic kidney disease, or unspecified chronic kidney disease: Secondary | ICD-10-CM | POA: Diagnosis not present

## 2013-05-21 DIAGNOSIS — Z79899 Other long term (current) drug therapy: Secondary | ICD-10-CM | POA: Diagnosis not present

## 2013-05-21 DIAGNOSIS — Z7982 Long term (current) use of aspirin: Secondary | ICD-10-CM | POA: Diagnosis not present

## 2013-05-21 DIAGNOSIS — J811 Chronic pulmonary edema: Secondary | ICD-10-CM | POA: Diagnosis not present

## 2013-05-21 DIAGNOSIS — I252 Old myocardial infarction: Secondary | ICD-10-CM | POA: Diagnosis not present

## 2013-05-22 DIAGNOSIS — I428 Other cardiomyopathies: Secondary | ICD-10-CM | POA: Diagnosis not present

## 2013-05-22 DIAGNOSIS — I10 Essential (primary) hypertension: Secondary | ICD-10-CM | POA: Diagnosis not present

## 2013-05-22 DIAGNOSIS — R079 Chest pain, unspecified: Secondary | ICD-10-CM | POA: Diagnosis not present

## 2013-05-22 DIAGNOSIS — N183 Chronic kidney disease, stage 3 unspecified: Secondary | ICD-10-CM | POA: Diagnosis not present

## 2013-05-22 DIAGNOSIS — Z9861 Coronary angioplasty status: Secondary | ICD-10-CM | POA: Diagnosis not present

## 2013-05-22 DIAGNOSIS — I129 Hypertensive chronic kidney disease with stage 1 through stage 4 chronic kidney disease, or unspecified chronic kidney disease: Secondary | ICD-10-CM | POA: Diagnosis not present

## 2013-05-22 DIAGNOSIS — Z049 Encounter for examination and observation for unspecified reason: Secondary | ICD-10-CM | POA: Diagnosis not present

## 2013-05-22 DIAGNOSIS — I214 Non-ST elevation (NSTEMI) myocardial infarction: Secondary | ICD-10-CM | POA: Diagnosis not present

## 2013-05-22 DIAGNOSIS — I2510 Atherosclerotic heart disease of native coronary artery without angina pectoris: Secondary | ICD-10-CM | POA: Diagnosis not present

## 2013-05-22 DIAGNOSIS — E785 Hyperlipidemia, unspecified: Secondary | ICD-10-CM | POA: Diagnosis not present

## 2013-05-23 DIAGNOSIS — I200 Unstable angina: Secondary | ICD-10-CM | POA: Diagnosis not present

## 2013-05-23 DIAGNOSIS — I428 Other cardiomyopathies: Secondary | ICD-10-CM | POA: Diagnosis not present

## 2013-05-27 ENCOUNTER — Other Ambulatory Visit: Payer: Self-pay | Admitting: *Deleted

## 2013-05-27 MED ORDER — RAMIPRIL 10 MG PO CAPS: 10.0000 mg | ORAL_CAPSULE | Freq: Two times a day (BID) | ORAL | Status: DC

## 2013-05-28 ENCOUNTER — Ambulatory Visit (INDEPENDENT_AMBULATORY_CARE_PROVIDER_SITE_OTHER): Payer: Medicare Other | Admitting: Family Medicine

## 2013-05-28 ENCOUNTER — Encounter: Payer: Self-pay | Admitting: Family Medicine

## 2013-05-28 VITALS — BP 170/69 | HR 58 | Wt 205.0 lb

## 2013-05-28 DIAGNOSIS — F3289 Other specified depressive episodes: Secondary | ICD-10-CM

## 2013-05-28 DIAGNOSIS — I10 Essential (primary) hypertension: Secondary | ICD-10-CM | POA: Diagnosis not present

## 2013-05-28 DIAGNOSIS — F329 Major depressive disorder, single episode, unspecified: Secondary | ICD-10-CM | POA: Diagnosis not present

## 2013-05-28 DIAGNOSIS — E785 Hyperlipidemia, unspecified: Secondary | ICD-10-CM | POA: Diagnosis not present

## 2013-05-28 DIAGNOSIS — I2510 Atherosclerotic heart disease of native coronary artery without angina pectoris: Secondary | ICD-10-CM | POA: Diagnosis not present

## 2013-05-28 NOTE — Progress Notes (Signed)
CC: Carlos Harrington is a 80 y.o. male is here for hospital f/u   Subjective: HPI:  Hospital followup for non-ST elevation MI requiring heart catheterization and single stenting.  In the hospital metoprolol was changed to carvedilol, he was started on brilinta and Lipitor was decreased from 80-40 mg daily. Continues on daily aspirin. Denies any chest pain since the time of discharge. Denies worsening shortness of breath, orthopnea, peripheral edema or PND.  Followup essential hypertension: Continues on amlodipine, carvedilol, doxazosin he checks his blood pressure multiple times throughout the day at home and it is consistently below 140/90 on the readings that he provides me today. He denies any motor or sensory disturbances or limb claudication. Denies lightheadedness or orthostatic like symptoms  Followup hyperlipidemia: Continues on Lipitor 40 mg daily without any right upper quadrant pain nor myalgias.  LDL cholesterol checked on the 22nd was 119.   followup depression: Continues on Lexapro 5 mg daily. He tells me that he feels like his depression has not been affected by his wife's recent death. He reports periods of emotions and crying when thinking about her however overall he feels somewhat better but she is no longer in pain and suffering. He has a supportive his daughter down here and he will be going to [place] for one week to spend time with family. There been no thoughts of wanting to harm himself or others.  He is fully independent in all ADLs. He states that talking about his wife helps relieve any lingering depression symptoms.   Review Of Systems Outlined In HPI  Past Medical History  Diagnosis Date  . CAD (coronary artery disease)     with stenting X 4 in [place]  . Hypertension   . Depression   . Anxiety   . Hyperlipidemia   . Macular degeneration     Past Surgical History  Procedure Laterality Date  . Appendectomy    . Cholecystectomy    . Tonsillectomy     Family  History  Problem Relation Age of Onset  . Hypertension Mother   . Hyperlipidemia Mother   . Diabetes Mother   . Other Mother     CVA  . Other Sister     brain tumor  . Other Brother     CVA    History   Social History  . Marital Status: Married    Spouse Name: N/A    Number of Children: N/A  . Years of Education: N/A   Occupational History  . Not on file.   Social History Main Topics  . Smoking status: Never Smoker   . Smokeless tobacco: Not on file  . Alcohol Use: Yes     Comment: 2 daily  . Drug Use:   . Sexual Activity:    Other Topics Concern  . Not on file   Social History Narrative  . No narrative on file     Objective: BP 170/69  Pulse 58  Wt 205 lb (92.987 kg)  General: Alert and Oriented, No Acute Distress HEENT: Pupils equal, round, reactive to light. Conjunctivae clear.   moist membranes pharynx unremarkable  Lungs: Clear to auscultation bilaterally, no wheezing/ronchi/rales.  Comfortable work of breathing. Good air movement. Cardiac: Regular rate and rhythm. Normal S1/S2.  No murmurs, rubs, nor gallops.   Abdomen:  obese soft nontender  Extremities: No peripheral edema.  Strong peripheral pulses.  Mental Status:  mild depression with occasional tearful episodes, no anxiety nor agitation  Skin: Warm and dry.  Assessment & Plan: Carlos Harrington was seen today for hospital f/u.  Diagnoses and associated orders for this visit:  CAD  Essential hypertension, benign  HYPERLIPIDEMIA  DEPRESSION    Coronary artery disease: Clinically controlled after stenting, he is on aspirin, beta blocker, statin. Since cardiology decreased his Lipitor we will not increase it back to 80 mg but I would like to recheck a fasting lipid panel in 2-3 months. In this cardiology says otherwise I will shoot for a goal of LDL less than 70. Essential hypertension: Uncontrolled our office however it appears controlled at home, I've asked him to bring in his blood pressure cuff for  calibration and if this confirms that outside blood pressures are below 140/90 we can postpone followup for another 2-3 months   hyperlipidemia: Continue Lipitor recheck cholesterol 2-3 months Depression: Somewhat uncontrolled directly related to his wife's passing. Patient denies desire for counseling, overall it appears that he is handling her death pretty well. We spent a good feel of time today talking over ways to remember positive influences on him in the rest of the world and that this will help with the grieving process.  40 minutes spent face-to-face during visit today of which at least 50% was counseling or coordinating care regarding: 1. CAD   2. Essential hypertension, benign   3. HYPERLIPIDEMIA   4. DEPRESSION       Return in about 3 months (around 08/28/2013).

## 2013-06-22 DIAGNOSIS — I519 Heart disease, unspecified: Secondary | ICD-10-CM | POA: Insufficient documentation

## 2013-06-25 DIAGNOSIS — I252 Old myocardial infarction: Secondary | ICD-10-CM | POA: Diagnosis not present

## 2013-06-25 DIAGNOSIS — I519 Heart disease, unspecified: Secondary | ICD-10-CM | POA: Diagnosis not present

## 2013-06-25 DIAGNOSIS — I2510 Atherosclerotic heart disease of native coronary artery without angina pectoris: Secondary | ICD-10-CM | POA: Diagnosis not present

## 2013-07-04 ENCOUNTER — Other Ambulatory Visit: Payer: Self-pay | Admitting: Family Medicine

## 2013-07-07 ENCOUNTER — Ambulatory Visit: Payer: Medicare Other | Admitting: Family Medicine

## 2013-07-29 DIAGNOSIS — I2510 Atherosclerotic heart disease of native coronary artery without angina pectoris: Secondary | ICD-10-CM | POA: Diagnosis not present

## 2013-08-19 ENCOUNTER — Encounter: Payer: Self-pay | Admitting: Family Medicine

## 2013-08-19 ENCOUNTER — Ambulatory Visit (INDEPENDENT_AMBULATORY_CARE_PROVIDER_SITE_OTHER): Payer: Medicare Other | Admitting: Family Medicine

## 2013-08-19 VITALS — BP 161/68 | HR 61 | Wt 206.0 lb

## 2013-08-19 DIAGNOSIS — R0602 Shortness of breath: Secondary | ICD-10-CM | POA: Diagnosis not present

## 2013-08-19 DIAGNOSIS — I10 Essential (primary) hypertension: Secondary | ICD-10-CM | POA: Diagnosis not present

## 2013-08-19 DIAGNOSIS — R739 Hyperglycemia, unspecified: Secondary | ICD-10-CM

## 2013-08-19 DIAGNOSIS — I2510 Atherosclerotic heart disease of native coronary artery without angina pectoris: Secondary | ICD-10-CM

## 2013-08-19 DIAGNOSIS — E538 Deficiency of other specified B group vitamins: Secondary | ICD-10-CM | POA: Diagnosis not present

## 2013-08-19 DIAGNOSIS — R7309 Other abnormal glucose: Secondary | ICD-10-CM

## 2013-08-19 DIAGNOSIS — E785 Hyperlipidemia, unspecified: Secondary | ICD-10-CM | POA: Diagnosis not present

## 2013-08-19 DIAGNOSIS — L608 Other nail disorders: Secondary | ICD-10-CM

## 2013-08-19 DIAGNOSIS — R159 Full incontinence of feces: Secondary | ICD-10-CM

## 2013-08-19 DIAGNOSIS — F411 Generalized anxiety disorder: Secondary | ICD-10-CM

## 2013-08-19 DIAGNOSIS — L603 Nail dystrophy: Secondary | ICD-10-CM

## 2013-08-19 LAB — CBC
HCT: 41.6 % (ref 39.0–52.0)
Hemoglobin: 14.6 g/dL (ref 13.0–17.0)
MCH: 33.2 pg (ref 26.0–34.0)
MCHC: 35.1 g/dL (ref 30.0–36.0)
MCV: 94.5 fL (ref 78.0–100.0)
PLATELETS: 147 10*3/uL — AB (ref 150–400)
RBC: 4.4 MIL/uL (ref 4.22–5.81)
RDW: 14.9 % (ref 11.5–15.5)
WBC: 6.7 10*3/uL (ref 4.0–10.5)

## 2013-08-19 LAB — HEMOGLOBIN A1C
HEMOGLOBIN A1C: 5.7 % — AB (ref <5.7)
MEAN PLASMA GLUCOSE: 117 mg/dL — AB (ref <117)

## 2013-08-19 MED ORDER — ESCITALOPRAM OXALATE 10 MG PO TABS: 10.0000 mg | ORAL_TABLET | Freq: Every day | ORAL | Status: DC

## 2013-08-19 NOTE — Progress Notes (Signed)
CC: Carlos Harrington is a 80 y.o. male is here for Shortness of Breath and Medication Management   Subjective: HPI:  Accompanied by daughter Carlos Harrington  Followup of hypertension: No outside blood pressures to report. Patient admits that he forgets to take his evening blood pressure medication including his second dose of carvedilol less thanof the week. Denies chest pain, orthopnea, peripheral edema, nor motor or sensory disturbances  Followup hyperlipidemia: Continues to take 40 mg of Lipitor on a daily basis without myalgias nor right upper quadrant pain. He exercises by walking 20-30 minutes most days of the week with his daughter. Denies when claudication or exertional chest pain  Them is reporting approximately 10-15 years of fecal incontinence described mostly as a urge incontinence with stool. No interventions as of yet. Symptoms are mild in severity nothing has made it better or worse. He would like to know if he can see a gastroenterologist. Denies fevers, chills, abdominal pain or blood or melena in the stool  Followup generalized anxiety disorder: Daughter states that she believes the Lexapro is starting to lose some of its effectiveness. Patient reports not wanting to leave the house or interact with old friends since the death of his wife. He reports symptoms as mild however daughter believes they're at least moderate in severity and present on a daily basis. Symptoms are improved with frequent napping during the day. Denies any mental disturbance other than that described above and denies depression  Brittle nails: Complains of flaking of all the fingernails has been present for about a year now. Denies skin changes or nail changes elsewhere  Major complaint of shortness of breath has been present for the past 2 weeks. He states that he always has shortness of breath with activity however it has declined partially from its baseline. Currently mild to moderate in severity and only present with  exertion. Denies any accompanied chest pain. Denies peripheral edema but review of systems is positive for chronic nonproductive cough.    Followup hyperglycemia: Not currently on any anti-hyperglycemics. Denies polyuria polyphasia or polydipsia but does report mild blurring of the vision despite wearing glasses.. is described as symmetric Without double vision. Has been present for matter of months not getting better or worse.    Review Of Systems Outlined In HPI  Past Medical History  Diagnosis Date  . CAD (coronary artery disease)     with stenting X 4 in [place]  . Hypertension   . Depression   . Anxiety   . Hyperlipidemia   . Macular degeneration     Past Surgical History  Procedure Laterality Date  . Appendectomy    . Cholecystectomy    . Tonsillectomy     Family History  Problem Relation Age of Onset  . Hypertension Mother   . Hyperlipidemia Mother   . Diabetes Mother   . Other Mother     CVA  . Other Sister     brain tumor  . Other Brother     CVA    History   Social History  . Marital Status: Married    Spouse Name: N/A    Number of Children: N/A  . Years of Education: N/A   Occupational History  . Not on file.   Social History Main Topics  . Smoking status: Never Smoker   . Smokeless tobacco: Not on file  . Alcohol Use: Yes     Comment: 2 daily  . Drug Use:   . Sexual Activity:    Other  Topics Concern  . Not on file   Social History Narrative  . No narrative on file     Objective: BP 161/68  Pulse 61  Wt 206 lb (93.441 kg)  SpO2 97%  General: Alert and Oriented, No Acute Distress HEENT: Pupils equal, round, reactive to light. Conjunctivae clear.  Moist mucous membranes pharynx unremarkable Lungs: Clear to auscultation bilaterally, no wheezing/ronchi/rales.  Comfortable work of breathing. Good air movement. Cardiac: Regular rate and rhythm. Normal S1/S2.  No murmurs, rubs, nor gallops.   Abdomen: Obese and soft Extremities: No peripheral  edema.  Strong peripheral pulses.  Mental Status: No depression, anxiety, nor agitation. Skin: Warm and dry. Mild flaking of the distal nails without any other gross abnormality the nail nor signs of onychomycosis  Assessment & Plan: Carlos Harrington was seen today for shortness of breath and medication management.  Diagnoses and associated orders for this visit:  Essential hypertension, benign  HYPERLIPIDEMIA - Lipid panel  Fecal incontinence - Ambulatory referral to Gastroenterology  Generalized anxiety disorder - escitalopram (LEXAPRO) 10 MG tablet; Take 1 tablet (10 mg total) by mouth daily.  Brittle nails  Shortness of breath - CBC - Vitamin B12  Hyperglycemia - HgB A1c  B12 DEFICIENCY - Vitamin B12    Essential hypertension: Uncontrolled, continue current antihypertensive regimen however place emphasis on taking nightly carvedilol and bring in blood pressure log at next visit Hyperlipidemia: Due for repeat lipid panel continue Lipitor pending results Fecal incontinence: Referral to gastroenterology Generalized anxiety disorder: Uncontrolled increase in Lexapro, for this will help with his irregular sleep pattern as well Brittle nails: Checking vitamin B12 given history of deficiency Shortness of breath: Checking hemoglobin if normal will try Spiriva, his lungs are clear today does not sound like pulmonary edema is influencing this at all currently Hyperglycemia: Due for A1c especially given blurred vision  40 minutes spent face-to-face during visit today of which at least 50% was counseling or coordinating care regarding: 1. Essential hypertension, benign   2. HYPERLIPIDEMIA   3. Fecal incontinence   4. Generalized anxiety disorder   5. Brittle nails   6. Shortness of breath   7. Hyperglycemia   8. B12 DEFICIENCY       Return in about 4 weeks (around 09/16/2013).

## 2013-08-20 ENCOUNTER — Telehealth: Payer: Self-pay | Admitting: Family Medicine

## 2013-08-20 ENCOUNTER — Encounter: Payer: Self-pay | Admitting: *Deleted

## 2013-08-20 DIAGNOSIS — R0602 Shortness of breath: Secondary | ICD-10-CM

## 2013-08-20 LAB — LIPID PANEL
Cholesterol: 145 mg/dL (ref 0–200)
HDL: 38 mg/dL — AB (ref >39)
LDL CALC: 63 mg/dL (ref 0–99)
Total CHOL/HDL Ratio: 3.8 Ratio
Triglycerides: 220 mg/dL — ABNORMAL HIGH (ref <150)
VLDL: 44 mg/dL — AB (ref 0–40)

## 2013-08-20 LAB — VITAMIN B12: VITAMIN B 12: 225 pg/mL (ref 211–911)

## 2013-08-20 MED ORDER — TIOTROPIUM BROMIDE MONOHYDRATE 2.5 MCG/ACT IN AERS: INHALATION_SPRAY | RESPIRATORY_TRACT | Status: DC

## 2013-08-20 NOTE — Telephone Encounter (Signed)
Letter mailed. Didn't see a # for daughter in the chart and pt cannot hear well

## 2013-08-20 NOTE — Telephone Encounter (Signed)
Carlos Harrington, Will you please let patient's daughter know that his blood work did not identify any source of his shortness of breath.  As we discussed, I'd recommend he start a new inhaler to help prevent cough and shortness of breath.  It's called spiriva and I sent it to the [facility].  F/U in 2-3 weeks to assess response.

## 2013-08-20 NOTE — Telephone Encounter (Signed)
Also cholesterol and blood sugar are at goal.

## 2013-08-26 ENCOUNTER — Ambulatory Visit (INDEPENDENT_AMBULATORY_CARE_PROVIDER_SITE_OTHER): Payer: Medicare Other | Admitting: Sports Medicine

## 2013-08-26 ENCOUNTER — Encounter: Payer: Self-pay | Admitting: Sports Medicine

## 2013-08-26 VITALS — BP 169/69 | HR 50 | Ht 67.0 in | Wt 209.0 lb

## 2013-08-26 DIAGNOSIS — M76899 Other specified enthesopathies of unspecified lower limb, excluding foot: Secondary | ICD-10-CM | POA: Diagnosis not present

## 2013-08-26 DIAGNOSIS — M7062 Trochanteric bursitis, left hip: Secondary | ICD-10-CM | POA: Insufficient documentation

## 2013-08-26 DIAGNOSIS — I2510 Atherosclerotic heart disease of native coronary artery without angina pectoris: Secondary | ICD-10-CM

## 2013-08-26 MED ORDER — TRAMADOL HCL 50 MG PO TABS: ORAL_TABLET | ORAL | Status: DC

## 2013-08-26 NOTE — Assessment & Plan Note (Signed)
Tramadol, formal physical therapy, unable to use NSAIDs due to concurrent antiplatelet therapy. Return in one month, injection if no better.

## 2013-08-26 NOTE — Progress Notes (Signed)
Patient ID: Carlos Harrington, male   DOB: 01/16/1932, 80 y.o.   MRN: [id number]   Subjective:    I'm seeing this patient as a consultation for: Marcial Pacas, DO  CC: Left hip pain  HPI: Carlos Harrington is a 80 year old male who presents today with two months of what he describes as left hip pain. On further questioning, it was determined that his pain is not located in the groin, but rather on the lateral aspect of the proximal left leg about the greater trochanter. Also reports some pain in the lateral aspect of the left lateral calf. No radicular symptoms or new-onset bowel or bladder insufficiency. Has weakness of this left leg and has been forced to use a cane. Pain is worst with adduction at the hip. Has not tried anything to relieve this pain.  Past medical history, Surgical history, Family history not pertinant except as noted below, Social history, Allergies, and medications have been entered into the medical record, reviewed, and no changes needed.   Review of Systems: No headache, visual changes, nausea, vomiting, diarrhea, constipation, dizziness, abdominal pain, skin rash, fevers, chills, night sweats, weight loss, swollen lymph nodes, body aches, joint swelling, muscle aches, chest pain, shortness of breath, mood changes, visual or auditory hallucinations.   Objective:   General: Well Developed, well nourished, and in no acute distress.  Neuro/Psych: Alert and oriented x3, extra-ocular muscles intact, able to move all 4 extremities, sensation grossly intact. Skin: Warm and dry, no rashes noted.  Respiratory: Not using accessory muscles, speaking in full sentences, trachea midline.  Cardiovascular: Pulses palpable, no extremity edema. Abdomen: Does not appear distended.  Left Hip: ROM IR: 45 Deg, ER: 45 Deg, Flexion: 120 Deg, Extension: 100 Deg, Abduction: 45 Deg, Adduction: 45 Deg Strength IR: 5/5, ER: 5/5, Flexion: 5/5, Extension: 5/5, Abduction: 5/5, Adduction: 5/5 Pelvic alignment  unremarkable to inspection and palpation. Standing hip rotation and gait without trendelenburg sign / unsteadiness. Point tenderness over the greater trochanter. No tenderness over piriformis. Pain with FADIR, relieved with FABER. No SI joint tenderness and normal minimal SI movement.  Impression and Recommendations:   This case required medical decision making of moderate complexity.  Pain of the left leg: Point tenderness over the left greater trochanter, paired with increased pain at adduction, is suggestive of trochanteric bursitis. Patient declined injection today and wishes to begin with formal PT and Tramadol. Concurrent antiplatelet therapy precludes NSAID use.

## 2013-08-27 DIAGNOSIS — R1310 Dysphagia, unspecified: Secondary | ICD-10-CM | POA: Diagnosis not present

## 2013-08-27 DIAGNOSIS — R159 Full incontinence of feces: Secondary | ICD-10-CM | POA: Diagnosis not present

## 2013-08-27 DIAGNOSIS — R32 Unspecified urinary incontinence: Secondary | ICD-10-CM | POA: Diagnosis not present

## 2013-08-28 ENCOUNTER — Ambulatory Visit: Payer: Medicare Other | Admitting: Family Medicine

## 2013-08-28 ENCOUNTER — Encounter: Payer: Self-pay | Admitting: Family Medicine

## 2013-08-28 DIAGNOSIS — R159 Full incontinence of feces: Secondary | ICD-10-CM | POA: Insufficient documentation

## 2013-09-10 ENCOUNTER — Other Ambulatory Visit: Payer: Self-pay | Admitting: Family Medicine

## 2013-09-23 ENCOUNTER — Ambulatory Visit: Payer: Medicare Other | Admitting: Sports Medicine

## 2013-09-23 ENCOUNTER — Ambulatory Visit (INDEPENDENT_AMBULATORY_CARE_PROVIDER_SITE_OTHER): Payer: Medicare Other | Admitting: Physical Therapy

## 2013-09-23 DIAGNOSIS — M76899 Other specified enthesopathies of unspecified lower limb, excluding foot: Secondary | ICD-10-CM

## 2013-09-23 DIAGNOSIS — M6281 Muscle weakness (generalized): Secondary | ICD-10-CM

## 2013-09-23 DIAGNOSIS — M25559 Pain in unspecified hip: Secondary | ICD-10-CM

## 2013-09-23 DIAGNOSIS — R262 Difficulty in walking, not elsewhere classified: Secondary | ICD-10-CM

## 2013-09-23 DIAGNOSIS — Z0289 Encounter for other administrative examinations: Secondary | ICD-10-CM

## 2013-09-26 ENCOUNTER — Encounter: Payer: Self-pay | Admitting: Family Medicine

## 2013-09-26 ENCOUNTER — Ambulatory Visit (INDEPENDENT_AMBULATORY_CARE_PROVIDER_SITE_OTHER): Payer: Medicare Other | Admitting: Family Medicine

## 2013-09-26 VITALS — BP 118/62 | HR 66 | Wt 208.0 lb

## 2013-09-26 DIAGNOSIS — M719 Bursopathy, unspecified: Secondary | ICD-10-CM

## 2013-09-26 DIAGNOSIS — R059 Cough, unspecified: Secondary | ICD-10-CM | POA: Insufficient documentation

## 2013-09-26 DIAGNOSIS — M67919 Unspecified disorder of synovium and tendon, unspecified shoulder: Secondary | ICD-10-CM

## 2013-09-26 DIAGNOSIS — R0602 Shortness of breath: Secondary | ICD-10-CM

## 2013-09-26 DIAGNOSIS — H9190 Unspecified hearing loss, unspecified ear: Secondary | ICD-10-CM

## 2013-09-26 DIAGNOSIS — I10 Essential (primary) hypertension: Secondary | ICD-10-CM | POA: Diagnosis not present

## 2013-09-26 DIAGNOSIS — F419 Anxiety disorder, unspecified: Secondary | ICD-10-CM

## 2013-09-26 DIAGNOSIS — I2510 Atherosclerotic heart disease of native coronary artery without angina pectoris: Secondary | ICD-10-CM | POA: Diagnosis not present

## 2013-09-26 DIAGNOSIS — F411 Generalized anxiety disorder: Secondary | ICD-10-CM

## 2013-09-26 DIAGNOSIS — M7581 Other shoulder lesions, right shoulder: Secondary | ICD-10-CM

## 2013-09-26 DIAGNOSIS — H9193 Unspecified hearing loss, bilateral: Secondary | ICD-10-CM

## 2013-09-26 DIAGNOSIS — R05 Cough: Secondary | ICD-10-CM | POA: Insufficient documentation

## 2013-09-26 NOTE — Progress Notes (Signed)
CC: Carlos Harrington is a 80 y.o. male is here for Hypertension   Subjective: HPI:  Followup essential hypertension: Continues to take carvedilol now twice a day, doxazosin, and amlodipine. He has noticed all blood pressures at home to be below 140/90. He denies dizziness, lightheadedness upon standing. Chest pain shortness of breath nor orthopnea  Followup anxiety: Continues to take Lexapro on a daily basis. He denies any anxiety or any other mental disturbance.  Followup shortness of breath: Since starting spiriva there has been no return of her shortness of breath with exertion nor with rest.  Complains of right shoulder pain has been present for the past 2 weeks on a daily basis. Worse when extending his arm above his head. Improves with rest. No interventions as of yet. Nothing else makes better or worse, is present on a daily basis and can occur anytime of the day. He denies any radiation of the pain and localizes it on the lateral aspect of the shoulder. Denies any overlying skin changes nor recent trauma  He is requesting a referral to a local audiologist because the [facility] has not addressed his concerns about repairing a hearing over the past 3 months   Review Of Systems Outlined In HPI  Past Medical History  Diagnosis Date  . CAD (coronary artery disease)     with stenting X 4 in [place]  . Hypertension   . Depression   . Anxiety   . Hyperlipidemia   . Macular degeneration     Past Surgical History  Procedure Laterality Date  . Appendectomy    . Cholecystectomy    . Tonsillectomy     Family History  Problem Relation Age of Onset  . Hypertension Mother   . Hyperlipidemia Mother   . Diabetes Mother   . Other Mother     CVA  . Other Sister     brain tumor  . Other Brother     CVA    History   Social History  . Marital Status: Married    Spouse Name: N/A    Number of Children: N/A  . Years of Education: N/A   Occupational History  . Not on file.   Social History Main  Topics  . Smoking status: Never Smoker   . Smokeless tobacco: Not on file  . Alcohol Use: Yes     Comment: 2 daily  . Drug Use:   . Sexual Activity:    Other Topics Concern  . Not on file   Social History Narrative  . No narrative on file     Objective: BP 118/62  Pulse 66  Wt 208 lb (94.348 kg)  General: Alert and Oriented, No Acute Distress HEENT: Pupils equal, round, reactive to light. Conjunctivae clear.  Moist mucous membranes pharynx unremarkable Lungs: Clear to auscultation bilaterally, no wheezing/ronchi/rales.  Comfortable work of breathing. Good air movement. Cardiac: Regular rate and rhythm. Normal S1/S2.  No murmurs, rubs, nor gallops.   Right shoulder exam reveals full range of motion and strength in all planes of motion and with individual rotator cuff testing. No overlying redness warmth or swelling.  Neer's test negative.  Hawkins test . Empty can positive. Crossarm test negative. O'Brien's test negative. Apprehension test negative. Speed's test negative. Extremities: No peripheral edema.  Strong peripheral pulses.  Mental Status: No depression, anxiety, nor agitation. Skin: Warm and dry.  Assessment & Plan: Alvie was seen today for hypertension.  Diagnoses and associated orders for this visit:  Essential hypertension, benign  Anxiety  SOB (shortness of breath)  Right rotator cuff tendinitis  Hearing loss, bilateral - Ambulatory referral to Audiology    Essential hypertension: Controlled continue amlodipine, doxazosin and carvedilol Shortness of breath: Resolved continue Spiriva Anxiety: Controlled continue Lexapro I believe that his shoulder pain is coming from a right rotator cuff tendinitis however mild in severity. I've given him a handout of home exercise/rehabilitation maneuvers to be done on a daily basis for the next 3-4 weeks.  Return in about 3 months (around 12/26/2013).

## 2013-09-30 ENCOUNTER — Encounter (INDEPENDENT_AMBULATORY_CARE_PROVIDER_SITE_OTHER): Payer: Medicare Other | Admitting: Physical Therapy

## 2013-09-30 DIAGNOSIS — M76899 Other specified enthesopathies of unspecified lower limb, excluding foot: Secondary | ICD-10-CM

## 2013-09-30 DIAGNOSIS — M6281 Muscle weakness (generalized): Secondary | ICD-10-CM

## 2013-09-30 DIAGNOSIS — M25559 Pain in unspecified hip: Secondary | ICD-10-CM

## 2013-09-30 DIAGNOSIS — R262 Difficulty in walking, not elsewhere classified: Secondary | ICD-10-CM

## 2013-10-23 DIAGNOSIS — H6123 Impacted cerumen, bilateral: Secondary | ICD-10-CM | POA: Diagnosis not present

## 2013-11-04 DIAGNOSIS — H903 Sensorineural hearing loss, bilateral: Secondary | ICD-10-CM | POA: Diagnosis not present

## 2013-11-19 DIAGNOSIS — I10 Essential (primary) hypertension: Secondary | ICD-10-CM | POA: Diagnosis not present

## 2013-11-19 DIAGNOSIS — Z955 Presence of coronary angioplasty implant and graft: Secondary | ICD-10-CM | POA: Diagnosis not present

## 2013-11-19 DIAGNOSIS — I2510 Atherosclerotic heart disease of native coronary artery without angina pectoris: Secondary | ICD-10-CM | POA: Diagnosis not present

## 2013-11-19 DIAGNOSIS — I255 Ischemic cardiomyopathy: Secondary | ICD-10-CM | POA: Diagnosis not present

## 2013-11-20 ENCOUNTER — Other Ambulatory Visit: Payer: Self-pay | Admitting: Family Medicine

## 2013-11-20 DIAGNOSIS — I10 Essential (primary) hypertension: Secondary | ICD-10-CM

## 2013-11-20 DIAGNOSIS — E785 Hyperlipidemia, unspecified: Secondary | ICD-10-CM

## 2013-11-21 ENCOUNTER — Ambulatory Visit (INDEPENDENT_AMBULATORY_CARE_PROVIDER_SITE_OTHER): Payer: Medicare Other | Admitting: Family Medicine

## 2013-11-21 ENCOUNTER — Encounter: Payer: Self-pay | Admitting: Family Medicine

## 2013-11-21 ENCOUNTER — Ambulatory Visit (INDEPENDENT_AMBULATORY_CARE_PROVIDER_SITE_OTHER): Payer: Medicare Other

## 2013-11-21 VITALS — BP 152/74 | HR 95 | Temp 97.7°F | Wt 211.0 lb

## 2013-11-21 DIAGNOSIS — R059 Cough, unspecified: Secondary | ICD-10-CM

## 2013-11-21 DIAGNOSIS — I517 Cardiomegaly: Secondary | ICD-10-CM

## 2013-11-21 DIAGNOSIS — R05 Cough: Secondary | ICD-10-CM

## 2013-11-21 DIAGNOSIS — I2510 Atherosclerotic heart disease of native coronary artery without angina pectoris: Secondary | ICD-10-CM

## 2013-11-21 DIAGNOSIS — J811 Chronic pulmonary edema: Secondary | ICD-10-CM | POA: Diagnosis not present

## 2013-11-21 DIAGNOSIS — I878 Other specified disorders of veins: Secondary | ICD-10-CM

## 2013-11-21 DIAGNOSIS — R0602 Shortness of breath: Secondary | ICD-10-CM | POA: Diagnosis not present

## 2013-11-21 NOTE — Progress Notes (Signed)
CC: Carlos Harrington is a 80 y.o. male is here for Shortness of Breath   Subjective: HPI:  Complains of shortness of breath described as moderate in severity only present with exertion. Denies any symptoms whatsoever at rest. It is accompanied by a cough. Cough is described as productive. Denies blood in sputum. He's had these symptoms at least since August when I saw him for initial workup. He doesn't believe that Spiriva has provided any benefit despite using it daily. He denies fevers, chills, wheezing, chest pain, motor or sensory disturbances. Denies any swelling of the appendages.   Review Of Systems Outlined In HPI  Past Medical History  Diagnosis Date  . CAD (coronary artery disease)     with stenting X 4 in [place]  . Hypertension   . Depression   . Anxiety   . Hyperlipidemia   . Macular degeneration     Past Surgical History  Procedure Laterality Date  . Appendectomy    . Cholecystectomy    . Tonsillectomy     Family History  Problem Relation Age of Onset  . Hypertension Mother   . Hyperlipidemia Mother   . Diabetes Mother   . Other Mother     CVA  . Other Sister     brain tumor  . Other Brother     CVA    History   Social History  . Marital Status: Married    Spouse Name: N/A    Number of Children: N/A  . Years of Education: N/A   Occupational History  . Not on file.   Social History Main Topics  . Smoking status: Never Smoker   . Smokeless tobacco: Not on file  . Alcohol Use: Yes     Comment: 2 daily  . Drug Use: Not on file  . Sexual Activity: Not on file   Other Topics Concern  . Not on file   Social History Narrative     Objective: BP 152/74 mmHg  Pulse 95  Temp(Src) 97.7 F (36.5 C) (Oral)  Wt 211 lb (95.709 kg)  SpO2 96%  General: Alert and Oriented, No Acute Distress HEENT: Pupils equal, round, reactive to light. Conjunctivae clear.  External ears unremarkable, canals clear with intact TMs with appropriate landmarks.  Middle ear appears  open without effusion. Pink inferior turbinates.  Moist mucous membranes, pharynx without inflammation nor lesions.  Neck supple without palpable lymphadenopathy nor abnormal masses. Lungs: Clear to auscultation bilaterally, no wheezing/ronchi/rales.  Comfortable work of breathing. Good air movement. Frequent coughing  Extremities: No peripheral edema.  Strong peripheral pulses.  Mental Status: No depression, anxiety, nor agitation. Skin: Warm and dry.  Assessment & Plan: Romel was seen today for shortness of breath.  Diagnoses and associated orders for this visit:  SOB (shortness of breath) - DG Chest 2 View; Future  Cough - DG Chest 2 View; Future    Shortness of breath and cough: Given duration of cough we are obtaining a chest x-ray today to look for new cardiopulmonary disease. If chest x-ray is unremarkable I'm beginning to think that this is chronic bronchitis and we will start Breo or similar inhaler based on Medicare coverage.  Return in about 4 weeks (around 12/19/2013), or if symptoms worsen or fail to improve.

## 2013-11-24 ENCOUNTER — Telehealth: Payer: Self-pay | Admitting: Family Medicine

## 2013-11-24 MED ORDER — FLUTICASONE FUROATE-VILANTEROL 100-25 MCG/INH IN AEPB: 1.0000 | INHALATION_SPRAY | Freq: Every day | RESPIRATORY_TRACT | Status: DC

## 2013-11-24 NOTE — Telephone Encounter (Signed)
Carlos Harrington, Will you please let patient know that his chest xray was normal.  To help his cough I've sent an Rx of an inhaler called Breo to his [facility]. F/U one month after starting this.

## 2013-11-24 NOTE — Telephone Encounter (Signed)
Message left on vm with results  

## 2013-12-03 DIAGNOSIS — H903 Sensorineural hearing loss, bilateral: Secondary | ICD-10-CM | POA: Diagnosis not present

## 2013-12-05 ENCOUNTER — Other Ambulatory Visit: Payer: Self-pay | Admitting: Family Medicine

## 2014-01-06 ENCOUNTER — Encounter: Payer: Self-pay | Admitting: Family Medicine

## 2014-01-06 ENCOUNTER — Ambulatory Visit (INDEPENDENT_AMBULATORY_CARE_PROVIDER_SITE_OTHER): Payer: Medicare Other | Admitting: Family Medicine

## 2014-01-06 VITALS — BP 162/65 | HR 52 | Wt 213.0 lb

## 2014-01-06 DIAGNOSIS — R05 Cough: Secondary | ICD-10-CM

## 2014-01-06 DIAGNOSIS — R7303 Prediabetes: Secondary | ICD-10-CM

## 2014-01-06 DIAGNOSIS — G4700 Insomnia, unspecified: Secondary | ICD-10-CM | POA: Diagnosis not present

## 2014-01-06 DIAGNOSIS — I10 Essential (primary) hypertension: Secondary | ICD-10-CM | POA: Diagnosis not present

## 2014-01-06 DIAGNOSIS — R7309 Other abnormal glucose: Secondary | ICD-10-CM | POA: Diagnosis not present

## 2014-01-06 DIAGNOSIS — R059 Cough, unspecified: Secondary | ICD-10-CM

## 2014-01-06 LAB — POCT GLYCOSYLATED HEMOGLOBIN (HGB A1C): Hemoglobin A1C: 5.8

## 2014-01-06 MED ORDER — RAMIPRIL 10 MG PO CAPS: ORAL_CAPSULE | ORAL | Status: DC

## 2014-01-06 MED ORDER — TRAZODONE HCL 50 MG PO TABS: 50.0000 mg | ORAL_TABLET | Freq: Every evening | ORAL | Status: DC | PRN

## 2014-01-06 NOTE — Progress Notes (Signed)
CC: Carlos Harrington is a 81 y.o. male is here for Hypertension and prediabetes   Subjective: HPI:  Follow-up prediabetes: Currently not on any antihyperglycemic medication. No formal exercise routine but tries to watch what he eats. Denies polyuria polyphagia polydipsia nor vision loss.  Follow-up essential hypertension: He tells me he continues on daily amlodipine, carvedilol, doxazosin, ramipril and spironolactone. Blood pressures report. Denies chest pain shortness of breath orthopnea nor peripheral edema.  When I saw him last he was complaining of a persistent cough, he started on a daily breo and states that symptoms are now absent. There are resolved within a few days after starting this new inhaler. He denies any wheezing or chest discomfort  Complains of difficulty with falling asleep and staying asleep. He tells me majority of nights out of the week he will retire to bed around 11and will not be able to fall asleep until 1 in the morning. He will then awaken around 3 in the morning play games on the computer for an hour then fall back asleep to wake up around 5 or 6 in the morning. Nothing particularly causes him to awake at night nor can he identify anything that keeps him awake while trying to sleep. He denies napping during the daytime.No interventions as of yet. Symptoms have been going on for at least a month now.   Review Of Systems Outlined In HPI  Past Medical History  Diagnosis Date  . CAD (coronary artery disease)     with stenting X 4 in [place]  . Hypertension   . Depression   . Anxiety   . Hyperlipidemia   . Macular degeneration     Past Surgical History  Procedure Laterality Date  . Appendectomy    . Cholecystectomy    . Tonsillectomy     Family History  Problem Relation Age of Onset  . Hypertension Mother   . Hyperlipidemia Mother   . Diabetes Mother   . Other Mother     CVA  . Other Sister     brain tumor  . Other Brother     CVA    History   Social  History  . Marital Status: Married    Spouse Name: N/A    Number of Children: N/A  . Years of Education: N/A   Occupational History  . Not on file.   Social History Main Topics  . Smoking status: Never Smoker   . Smokeless tobacco: Not on file  . Alcohol Use: Yes     Comment: 2 daily  . Drug Use: Not on file  . Sexual Activity: Not on file   Other Topics Concern  . Not on file   Social History Narrative     Objective: BP 162/65 mmHg  Pulse 52  Wt 213 lb (96.616 kg)  General: Alert and Oriented, No Acute Distress HEENT: Pupils equal, round, reactive to light. Conjunctivae clear.  Moist mucous membranes pharynx unremarkable Lungs: Clear to auscultation bilaterally, no wheezing/ronchi/rales.  Comfortable work of breathing. Good air movement. Cardiac: Regular rate and rhythm. Normal S1/S2.  No murmurs, rubs, nor gallops.   Abdomen: obese and soft Extremities: No peripheral edema.  Strong peripheral pulses.  Mental Status: No depression, anxiety, nor agitation. Skin: Warm and dry.  Assessment & Plan: Gervis was seen today for hypertension and prediabetes.  Diagnoses and associated orders for this visit:  Pre-diabetes - POCT HgB A1C  Essential hypertension, benign - ramipril (ALTACE) 10 MG capsule; TAKE ONE CAPSULE BY MOUTH  2 TIMES A DAY  Insomnia - traZODone (DESYREL) 50 MG tablet; Take 1 tablet (50 mg total) by mouth at bedtime as needed for sleep.  Cough    Pre-diabetes: A1c of 5.8, controlled, discussed beginning a walking regimen to help keep blood sugar under control. Essential hypertension: Uncontrolled, I think is very unlikely that he actually is taking ramipril given history of prescriptions written, he should've been out of this medication sometime in October or November. Restart formal regimen Insomnia: Begin as needed trazodone Cough: Controlled continue Breo, discussed that I plan to challenge him by stopping this in the springtime to see if his cough  returns.    Return in about 3 months (around 04/07/2014) for Blood pressure and blood sugar.[name]

## 2014-01-07 DIAGNOSIS — I10 Essential (primary) hypertension: Secondary | ICD-10-CM | POA: Diagnosis not present

## 2014-01-07 DIAGNOSIS — Z01818 Encounter for other preprocedural examination: Secondary | ICD-10-CM | POA: Diagnosis not present

## 2014-01-07 DIAGNOSIS — I639 Cerebral infarction, unspecified: Secondary | ICD-10-CM | POA: Diagnosis not present

## 2014-01-07 DIAGNOSIS — I499 Cardiac arrhythmia, unspecified: Secondary | ICD-10-CM | POA: Diagnosis not present

## 2014-01-07 DIAGNOSIS — I255 Ischemic cardiomyopathy: Secondary | ICD-10-CM | POA: Diagnosis not present

## 2014-01-07 DIAGNOSIS — I2510 Atherosclerotic heart disease of native coronary artery without angina pectoris: Secondary | ICD-10-CM | POA: Diagnosis not present

## 2014-01-30 ENCOUNTER — Encounter: Payer: Self-pay | Admitting: Family Medicine

## 2014-01-30 ENCOUNTER — Ambulatory Visit (INDEPENDENT_AMBULATORY_CARE_PROVIDER_SITE_OTHER): Payer: Medicare Other | Admitting: Family Medicine

## 2014-01-30 VITALS — BP 142/61 | HR 62 | Temp 97.5°F | Wt 216.0 lb

## 2014-01-30 DIAGNOSIS — J441 Chronic obstructive pulmonary disease with (acute) exacerbation: Secondary | ICD-10-CM

## 2014-01-30 MED ORDER — PREDNISONE 20 MG PO TABS: ORAL_TABLET | ORAL | Status: AC

## 2014-01-30 MED ORDER — LEVOFLOXACIN 500 MG PO TABS: 500.0000 mg | ORAL_TABLET | Freq: Every day | ORAL | Status: DC

## 2014-01-30 NOTE — Progress Notes (Signed)
CC: Carlos Harrington is a 81 y.o. male is here for Cough   Subjective: HPI:  Complains of one half weeks of productive cough that has been accompanied by wheezing and extreme nausea while coughing. Symptoms have been persistent and moderate in severity since onset. Mild benefit from over-the-counter cough syrup including Robitussin. Nothing else seems to make symptoms better or worse. They came on abruptly. Attempted by subjective fevers. Denies confusion, shortness of breath, blood in sputum, chest pain, back pain, orthopnea nor peripheral edema   Review Of Systems Outlined In HPI  Past Medical History  Diagnosis Date  . CAD (coronary artery disease)     with stenting X 4 in [place]  . Hypertension   . Depression   . Anxiety   . Hyperlipidemia   . Macular degeneration     Past Surgical History  Procedure Laterality Date  . Appendectomy    . Cholecystectomy    . Tonsillectomy     Family History  Problem Relation Age of Onset  . Hypertension Mother   . Hyperlipidemia Mother   . Diabetes Mother   . Other Mother     CVA  . Other Sister     brain tumor  . Other Brother     CVA    History   Social History  . Marital Status: Married    Spouse Name: N/A    Number of Children: N/A  . Years of Education: N/A   Occupational History  . Not on file.   Social History Main Topics  . Smoking status: Never Smoker   . Smokeless tobacco: Not on file  . Alcohol Use: Yes     Comment: 2 daily  . Drug Use: Not on file  . Sexual Activity: Not on file   Other Topics Concern  . Not on file   Social History Narrative     Objective: BP 142/61 mmHg  Pulse 62  Temp(Src) 97.5 F (36.4 C) (Oral)  Wt 216 lb (97.977 kg)  SpO2 95%  General: Alert and Oriented, No Acute Distress HEENT: Pupils equal, round, reactive to light. Conjunctivae clear.  External ears unremarkable, canals clear with intact TMs with appropriate landmarks.  Middle ear appears open without effusion. Pink inferior  turbinates.  Moist mucous membranes, pharynx without inflammation nor lesions.  Neck supple without palpable lymphadenopathy nor abnormal masses. Lungs: comfortable work of breathing with mild central rhonchi and mild central wheezing only with expiration. No rales. No signs of consolidation Cardiac: Regular rate and rhythm. Normal S1/S2.  No murmurs, rubs, nor gallops.   Extremities: No peripheral edema.  Strong peripheral pulses.  Mental Status: No depression, anxiety, nor agitation. Skin: Warm and dry.  Assessment & Plan: Carlos Harrington was seen today for cough.  Diagnoses and associated orders for this visit:  COPD exacerbation - levofloxacin (LEVAQUIN) 500 MG tablet; Take 1 tablet (500 mg total) by mouth daily. - predniSONE (DELTASONE) 20 MG tablet; Three tabs at once daily for five days.    COPD exacerbation: Given recent pulmonary issues high suspicion for chronic bronchitis and today's decline would be reflective of an exacerbation. Begin levofloxacin and prednisone burst.signs and symptoms that would require urgent and emergent reevaluation for this condition were discussed with the patient.   Return if symptoms worsen or fail to improve.

## 2014-02-06 ENCOUNTER — Ambulatory Visit (INDEPENDENT_AMBULATORY_CARE_PROVIDER_SITE_OTHER): Payer: Medicare Other | Admitting: Family Medicine

## 2014-02-06 ENCOUNTER — Encounter: Payer: Self-pay | Admitting: Family Medicine

## 2014-02-06 ENCOUNTER — Ambulatory Visit: Payer: Medicare Other

## 2014-02-06 VITALS — BP 128/65 | HR 64 | Wt 220.0 lb

## 2014-02-06 DIAGNOSIS — T148 Other injury of unspecified body region: Secondary | ICD-10-CM

## 2014-02-06 DIAGNOSIS — R609 Edema, unspecified: Secondary | ICD-10-CM | POA: Diagnosis not present

## 2014-02-06 DIAGNOSIS — T148XXA Other injury of unspecified body region, initial encounter: Secondary | ICD-10-CM

## 2014-02-06 DIAGNOSIS — R600 Localized edema: Secondary | ICD-10-CM | POA: Diagnosis not present

## 2014-02-06 DIAGNOSIS — S9002XA Contusion of left ankle, initial encounter: Secondary | ICD-10-CM | POA: Diagnosis not present

## 2014-02-06 MED ORDER — FUROSEMIDE 20 MG PO TABS: 20.0000 mg | ORAL_TABLET | Freq: Two times a day (BID) | ORAL | Status: DC

## 2014-02-06 NOTE — Progress Notes (Signed)
CC: Carlos Harrington is a 81 y.o. male is here for Foot Swelling and Shortness of Breath   Subjective: HPI:  Accompanied by daughter  Patient reports bruising and swelling of both lower extremities, the swelling has been present for matter of days. The bruising started this morning and has been progressing as the day has gone on. It has been accompanied by pain in both calf muscles that have been present for the last 2 days. Pain is worse with standing, worse with painting, he's been doing more standing and painting over the past 2 days and admits to being much more active. He's also had some bruising overlying the midulnar region of his forearm on the right arm.  He also reports shortness of breath that has not improved much since starting prednisone and Levaquin. He denies cough wheezing or chest discomfort. He is positive that he has not had a fall in the last 5 days.no interventions as of yet for the above. He denies irregular heartbeat, rapid heartbeat nor motor or sensory disturbances other than that described above. Has not experienced fevers, chills, abdominal pain nor pain extremities other than that described above   Review Of Systems Outlined In HPI  Past Medical History  Diagnosis Date  . CAD (coronary artery disease)     with stenting X 4 in [place]  . Hypertension   . Depression   . Anxiety   . Hyperlipidemia   . Macular degeneration     Past Surgical History  Procedure Laterality Date  . Appendectomy    . Cholecystectomy    . Tonsillectomy     Family History  Problem Relation Age of Onset  . Hypertension Mother   . Hyperlipidemia Mother   . Diabetes Mother   . Other Mother     CVA  . Other Sister     brain tumor  . Other Brother     CVA    History   Social History  . Marital Status: Married    Spouse Name: N/A    Number of Children: N/A  . Years of Education: N/A   Occupational History  . Not on file.   Social History Main Topics  . Smoking status: Never  Smoker   . Smokeless tobacco: Not on file  . Alcohol Use: Yes     Comment: 2 daily  . Drug Use: Not on file  . Sexual Activity: Not on file   Other Topics Concern  . Not on file   Social History Narrative     Objective: BP 128/65 mmHg  Pulse 64  Wt 220 lb (99.791 kg)  SpO2 98%  General: Alert and Oriented, No Acute Distress HEENT: Pupils equal, round, reactive to light. Conjunctivae clear.  Moist mucous membranes Lungs: Clear to auscultation bilaterally, no wheezing/ronchi/rales.  Comfortable work of breathing. Good air movement. Cardiac: Regular rate and rhythm. Normal S1/S2.  No murmurs, rubs, nor gallops.   Abdomen: obese and soft Extremities: 2+ pitting edema in the left lower extremity from the shin distally and 1+ pitting edema in the right extremity from the shin distally..  Strong peripheral pulses. Calf pain bilaterally with direct palpation of the calves, no pain with compression of the fibula or tibia bilaterally. No pain with palpation of the medial or lateral malleoli bilateral, no pain with palpation at the base of the fifth metatarsal bilaterally,no pain with resisted ankle inversion or eversion bilaterally Mental Status: No depression, anxiety, nor agitation. Skin: Warm and dry. Moderate ecchymosis from the  left ankle downward mostly involving the medial aspect of the ankle, trace ecchymosis on the right ankle overlying the medial malleoli.  Assessment & Plan: Carlos Harrington was seen today for foot swelling and shortness of breath.  Diagnoses and associated orders for this visit:  Bilateral edema of lower extremity - Cancel: Lower Extremity Venous Duplex Bilateral; Future - CBC - US Venous Img Lower Bilateral - furosemide (LASIX) 20 MG tablet; Take 1 tablet (20 mg total) by mouth 2 (two) times daily.  Bruising - Cancel: Lower Extremity Venous Duplex Bilateral; Future - CBC    Bilateral lower extremity edema: Venous Doppler was obtained to rule out DVT, fortunately  this was negative. I believe that he is probably mildly torn both of his gastroc muscles and that's causing some dependent bruising. Will obtain platelet count to rule out thrombocytopenia. Begin as needed Lasix starting tomorrow for edema. There is a moderate suspicion that he had a fall and is not admitting it to me and his daughter.  Return if symptoms worsen or fail to improve.

## 2014-02-07 LAB — CBC
HCT: 42.5 % (ref 39.0–52.0)
Hemoglobin: 14.3 g/dL (ref 13.0–17.0)
MCH: 33.3 pg (ref 26.0–34.0)
MCHC: 33.6 g/dL (ref 30.0–36.0)
MCV: 99.1 fL (ref 78.0–100.0)
MPV: 8.7 fL (ref 8.6–12.4)
PLATELETS: 161 10*3/uL (ref 150–400)
RBC: 4.29 MIL/uL (ref 4.22–5.81)
RDW: 15.2 % (ref 11.5–15.5)
WBC: 8.7 10*3/uL (ref 4.0–10.5)

## 2014-02-18 ENCOUNTER — Other Ambulatory Visit: Payer: Self-pay | Admitting: Family Medicine

## 2014-02-23 ENCOUNTER — Encounter: Payer: Self-pay | Admitting: Physician Assistant

## 2014-02-23 ENCOUNTER — Ambulatory Visit (INDEPENDENT_AMBULATORY_CARE_PROVIDER_SITE_OTHER): Payer: Medicare Other | Admitting: Physician Assistant

## 2014-02-23 VITALS — BP 128/60 | HR 57 | Wt 216.0 lb

## 2014-02-23 DIAGNOSIS — F419 Anxiety disorder, unspecified: Secondary | ICD-10-CM

## 2014-02-23 MED ORDER — DIAZEPAM 5 MG PO TABS: 2.5000 mg | ORAL_TABLET | Freq: Two times a day (BID) | ORAL | Status: DC | PRN

## 2014-02-23 NOTE — Progress Notes (Signed)
   Subjective:    Patient ID: Carlos Harrington, male    DOB: [birth year], 81 y.o.   MRN: [id number]  HPI  Patient is a 81 year old male who presents to the clinic alone today. He comes in for anxiety and increased filling that something bad is going to happen. He is felt this way for the last 2 weeks. He is finding it hard to relax and feels uptight. He admits that he is a part of a card group and his friend lost his wife 2 weeks ago. He lost his wife last year. It has just brought up feelings of anxiety in that something bad is going to happen. He denies any suicidal thoughts. He is having some increasing problems sleeping.      Review of Systems  All other systems reviewed and are negative.      Objective:   Physical Exam  Constitutional: He is oriented to person, place, and time. He appears well-developed and well-nourished.  HENT:  Head: Normocephalic and atraumatic.  Cardiovascular: Normal rate, regular rhythm and normal heart sounds.   Pulmonary/Chest: Effort normal and breath sounds normal. He has no wheezes.  Neurological: He is alert and oriented to person, place, and time.  Skin: Skin is dry.  Psychiatric: He has a normal mood and affect. His behavior is normal.          Assessment & Plan:  Anxiety- GAD-7 was 7. I did see a history of Valium that was given to patient's daughter to use as needed for anxiety back last year. Patient reports he is not using it. I do think having small supply of Valium could help with patient has increased anxiety.discussed side effects and sedation warning with patient.  I did write a letter to his daughter on my instructions for patient. If anxiety seems to not be improving patient does need follow-up with PCP and consider increasing Lexapro. I do think patient would potentially benefit from talking with someone. I will go ahead and make referral. Certainly daughter or patient can call with any concerning symptoms.

## 2014-02-24 DIAGNOSIS — I2510 Atherosclerotic heart disease of native coronary artery without angina pectoris: Secondary | ICD-10-CM | POA: Diagnosis not present

## 2014-02-24 DIAGNOSIS — Z955 Presence of coronary angioplasty implant and graft: Secondary | ICD-10-CM | POA: Diagnosis not present

## 2014-02-24 DIAGNOSIS — E780 Pure hypercholesterolemia: Secondary | ICD-10-CM | POA: Diagnosis not present

## 2014-02-24 DIAGNOSIS — I252 Old myocardial infarction: Secondary | ICD-10-CM | POA: Diagnosis not present

## 2014-02-24 DIAGNOSIS — I10 Essential (primary) hypertension: Secondary | ICD-10-CM | POA: Diagnosis not present

## 2014-02-26 ENCOUNTER — Telehealth: Payer: Self-pay | Admitting: Family Medicine

## 2014-02-26 DIAGNOSIS — R0602 Shortness of breath: Secondary | ICD-10-CM

## 2014-02-26 NOTE — Telephone Encounter (Signed)
Results state that results were discussed with patient during the evaluation.

## 2014-03-04 ENCOUNTER — Other Ambulatory Visit: Payer: Self-pay | Admitting: Family Medicine

## 2014-03-11 ENCOUNTER — Encounter: Payer: Self-pay | Admitting: Family Medicine

## 2014-04-08 ENCOUNTER — Encounter: Payer: Self-pay | Admitting: Family Medicine

## 2014-04-08 ENCOUNTER — Ambulatory Visit (INDEPENDENT_AMBULATORY_CARE_PROVIDER_SITE_OTHER): Payer: Medicare Other | Admitting: Family Medicine

## 2014-04-08 VITALS — BP 138/67 | HR 61 | Wt 220.0 lb

## 2014-04-08 DIAGNOSIS — F411 Generalized anxiety disorder: Secondary | ICD-10-CM

## 2014-04-08 DIAGNOSIS — R7309 Other abnormal glucose: Secondary | ICD-10-CM

## 2014-04-08 DIAGNOSIS — I10 Essential (primary) hypertension: Secondary | ICD-10-CM

## 2014-04-08 DIAGNOSIS — R7303 Prediabetes: Secondary | ICD-10-CM

## 2014-04-08 LAB — HEMOGLOBIN A1C
Hgb A1c MFr Bld: 6 % — ABNORMAL HIGH (ref <5.7)
MEAN PLASMA GLUCOSE: 126 mg/dL — AB (ref <117)

## 2014-04-08 MED ORDER — ESCITALOPRAM OXALATE 20 MG PO TABS: 20.0000 mg | ORAL_TABLET | Freq: Every day | ORAL | Status: DC

## 2014-04-08 NOTE — Progress Notes (Signed)
CC: Carlos Harrington is a 81 y.o. male is here for Hypertension and Diabetes   Subjective: HPI:  Follow-up essential hypertension: Continues on amlodipine, carvedilol, Doxazosin, ramipril with no outside blood pressures to report. He denies chest pain shortness of breath orthopnea nor peripheral edema.  Follow prediabetes: Currently not on any diabetic medication. Denies polyuria polyphagia or polydipsia nor poorly healing wounds.  Follow-up generalized anxiety disorder: He was given a Valium prescription when he was here 2 months ago when he saw our 74 assistant. It sounds like he is not taking this but still has difficulty most days of the week nor an anxiousness and nervousness. It seems to be worse the more he gets bad news regarding friends and family members. For instance a friend has lost his wife recently and the patient's realtor lost the pregnancy. He tells me this caused him to think about and reminisce about his wife and caused him to get tearful and nervous to moderate degree. It interferes with his ability to fall asleep. He denies any other mental disturbance. He continues to take Lexapro 10 mg daily.   Review Of Systems Outlined In HPI  Past Medical History  Diagnosis Date  . CAD (coronary artery disease)     with stenting X 4 in [place]  . Hypertension   . Depression   . Anxiety   . Hyperlipidemia   . Macular degeneration     Past Surgical History  Procedure Laterality Date  . Appendectomy    . Cholecystectomy    . Tonsillectomy     Family History  Problem Relation Age of Onset  . Hypertension Mother   . Hyperlipidemia Mother   . Diabetes Mother   . Other Mother     CVA  . Other Sister     brain tumor  . Other Brother     CVA    History   Social History  . Marital Status: Married    Spouse Name: N/A  . Number of Children: N/A  . Years of Education: N/A   Occupational History  . Not on file.   Social History Main Topics  . Smoking status: Never  Smoker   . Smokeless tobacco: Not on file  . Alcohol Use: Yes     Comment: 2 daily  . Drug Use: Not on file  . Sexual Activity: Not on file   Other Topics Concern  . Not on file   Social History Narrative     Objective: BP 138/67 mmHg  Pulse 61  Wt 220 lb (99.791 kg)  General: Alert and Oriented, No Acute Distress HEENT: Pupils equal, round, reactive to light. Conjunctivae clear.  Moist mucous membranes pharynx unremarkable Lungs: Clear to auscultation bilaterally, no wheezing/ronchi/rales.  Comfortable work of breathing. Good air movement. Cardiac: Regular rate and rhythm. Normal S1/S2.  No murmurs, rubs, nor gallops.   Abdomen: Obese and soft Extremities: No peripheral edema.  Strong peripheral pulses.  Mental Status: No depression, anxiety, nor agitation. Skin: Warm and dry.  Assessment & Plan: Carlos Harrington was seen today for hypertension and diabetes.  Diagnoses and all orders for this visit:  Essential hypertension, benign  Pre-diabetes Orders: -     Hemoglobin A1c  Generalized anxiety disorder Orders: -     escitalopram (LEXAPRO) 20 MG tablet; Take 1 tablet (20 mg total) by mouth daily.   Essential hypertension: Controlled continue all antihypertensive medication Prediabetes: Due for an A1c today he has to go downstairs to have this done because her fingerstick  machine is broken Generalized anxiety disorder: Uncontrolled, increasing Lexapro follow-up in one month if ineffective otherwise can push up to 3 month follow-up.   Return in about 3 months (around 07/08/2014).

## 2014-04-13 ENCOUNTER — Ambulatory Visit (INDEPENDENT_AMBULATORY_CARE_PROVIDER_SITE_OTHER): Payer: Medicare Other | Admitting: Family Medicine

## 2014-04-13 ENCOUNTER — Encounter: Payer: Self-pay | Admitting: Family Medicine

## 2014-04-13 VITALS — BP 147/69 | HR 61 | Wt 218.0 lb

## 2014-04-13 DIAGNOSIS — R05 Cough: Secondary | ICD-10-CM | POA: Diagnosis not present

## 2014-04-13 DIAGNOSIS — R059 Cough, unspecified: Secondary | ICD-10-CM

## 2014-04-13 MED ORDER — PREDNISONE 20 MG PO TABS: ORAL_TABLET | ORAL | Status: AC

## 2014-04-13 MED ORDER — AZITHROMYCIN 250 MG PO TABS: ORAL_TABLET | ORAL | Status: AC

## 2014-04-13 MED ORDER — BENZONATATE 100 MG PO CAPS: 100.0000 mg | ORAL_CAPSULE | Freq: Two times a day (BID) | ORAL | Status: DC | PRN

## 2014-04-13 NOTE — Progress Notes (Signed)
CC: Carlos Harrington is a 81 y.o. male is here for Cough   Subjective: HPI:  Complains of cough that is sometimes productive mostly nonproductive with fatigue and wheezing that has been present since yesterday. Otherwise he feels like he's in his regular state of health. Symptoms are improved with Tessalon Perles. No other interventions. Present all hours of the day but not with sleep. Denies shortness of breath, chest pain, orthopnea nor peripheral edema. No fevers, chills nor confusion denies nasal congestion sore throat or postnasal drip   Review Of Systems Outlined In HPI  Past Medical History  Diagnosis Date  . CAD (coronary artery disease)     with stenting X 4 in [place]  . Hypertension   . Depression   . Anxiety   . Hyperlipidemia   . Macular degeneration     Past Surgical History  Procedure Laterality Date  . Appendectomy    . Cholecystectomy    . Tonsillectomy     Family History  Problem Relation Age of Onset  . Hypertension Mother   . Hyperlipidemia Mother   . Diabetes Mother   . Other Mother     CVA  . Other Sister     brain tumor  . Other Brother     CVA    History   Social History  . Marital Status: Married    Spouse Name: N/A  . Number of Children: N/A  . Years of Education: N/A   Occupational History  . Not on file.   Social History Main Topics  . Smoking status: Never Smoker   . Smokeless tobacco: Not on file  . Alcohol Use: Yes     Comment: 2 daily  . Drug Use: Not on file  . Sexual Activity: Not on file   Other Topics Concern  . Not on file   Social History Narrative     Objective: BP 147/69 mmHg  Pulse 61  Wt 218 lb (98.884 kg)  SpO2 94%  General: Alert and Oriented, No Acute Distress HEENT: Pupils equal, round, reactive to light. Conjunctivae clear.  Moist mucous membranes pharynx unremarkable Lungs: Comfortable work of breathing with mild rhonchi and trace wheezing in all lung fields. No signs of consolidation Cardiac: Regular rate  and rhythm. Normal S1/S2.  No murmurs, rubs, nor gallops.   Extremities: No peripheral edema.  Strong peripheral pulses.  Mental Status: No depression, anxiety, nor agitation. Skin: Warm and dry.  Assessment & Plan: Carlos Harrington was seen today for cough.  Diagnoses and all orders for this visit:  Cough Orders: -     predniSONE (DELTASONE) 20 MG tablet; Three tabs at once daily for five days. -     azithromycin (ZITHROMAX) 250 MG tablet; Take two tabs at once on day 1, then one tab daily on days 2-5.  Other orders -     benzonatate (TESSALON) 100 MG capsule; Take 1 capsule (100 mg total) by mouth 2 (two) times daily as needed for cough.   Cough with wheezing: Start prednisone and azithromycin. New prescription for Tessalon Perles provided  No Follow-up on file.

## 2014-04-16 DIAGNOSIS — H6123 Impacted cerumen, bilateral: Secondary | ICD-10-CM | POA: Diagnosis not present

## 2014-04-16 DIAGNOSIS — H903 Sensorineural hearing loss, bilateral: Secondary | ICD-10-CM | POA: Diagnosis not present

## 2014-05-06 DIAGNOSIS — H903 Sensorineural hearing loss, bilateral: Secondary | ICD-10-CM | POA: Diagnosis not present

## 2014-05-12 DIAGNOSIS — H903 Sensorineural hearing loss, bilateral: Secondary | ICD-10-CM | POA: Diagnosis not present

## 2014-05-14 ENCOUNTER — Telehealth: Payer: Self-pay | Admitting: Family Medicine

## 2014-05-14 DIAGNOSIS — I10 Essential (primary) hypertension: Secondary | ICD-10-CM

## 2014-05-14 NOTE — Telephone Encounter (Signed)
[facility] called, they are going to start pre-packaging Rx's for the Pt so there is no confusion on what Rx need to be taken and when. The pharmacy called to see if the Pt is still taking the Rx Doxazosin. I do not see where we have written that Rx since 2014 but at the last visit it was documented the Pt was still taking it. Please advise, if the Pt is to continue he will need a new Rx.  Also, can we send over an Rx for ASA 81mg ? Thank you.

## 2014-05-15 MED ORDER — ASPIRIN EC 81 MG PO TBEC: 81.0000 mg | DELAYED_RELEASE_TABLET | Freq: Every day | ORAL | Status: DC

## 2014-05-15 MED ORDER — DOXAZOSIN MESYLATE 4 MG PO TABS: 4.0000 mg | ORAL_TABLET | Freq: Every day | ORAL | Status: DC

## 2014-05-15 NOTE — Telephone Encounter (Signed)
Carlos Harrington, Rx for both of these medications have now been sent to [facility].

## 2014-05-23 DIAGNOSIS — D7589 Other specified diseases of blood and blood-forming organs: Secondary | ICD-10-CM | POA: Diagnosis not present

## 2014-05-23 DIAGNOSIS — G8191 Hemiplegia, unspecified affecting right dominant side: Secondary | ICD-10-CM | POA: Diagnosis not present

## 2014-05-23 DIAGNOSIS — R42 Dizziness and giddiness: Secondary | ICD-10-CM | POA: Diagnosis not present

## 2014-05-23 DIAGNOSIS — M5416 Radiculopathy, lumbar region: Secondary | ICD-10-CM | POA: Diagnosis not present

## 2014-05-23 DIAGNOSIS — I639 Cerebral infarction, unspecified: Secondary | ICD-10-CM | POA: Diagnosis not present

## 2014-05-23 DIAGNOSIS — M47816 Spondylosis without myelopathy or radiculopathy, lumbar region: Secondary | ICD-10-CM | POA: Diagnosis not present

## 2014-05-23 DIAGNOSIS — M4316 Spondylolisthesis, lumbar region: Secondary | ICD-10-CM | POA: Diagnosis not present

## 2014-05-23 DIAGNOSIS — R531 Weakness: Secondary | ICD-10-CM | POA: Diagnosis not present

## 2014-05-23 DIAGNOSIS — R079 Chest pain, unspecified: Secondary | ICD-10-CM | POA: Diagnosis not present

## 2014-05-23 DIAGNOSIS — Z9621 Cochlear implant status: Secondary | ICD-10-CM | POA: Diagnosis not present

## 2014-05-23 DIAGNOSIS — I10 Essential (primary) hypertension: Secondary | ICD-10-CM | POA: Diagnosis not present

## 2014-05-23 DIAGNOSIS — I509 Heart failure, unspecified: Secondary | ICD-10-CM | POA: Diagnosis not present

## 2014-05-24 DIAGNOSIS — I639 Cerebral infarction, unspecified: Secondary | ICD-10-CM | POA: Diagnosis present

## 2014-05-24 DIAGNOSIS — F419 Anxiety disorder, unspecified: Secondary | ICD-10-CM | POA: Diagnosis present

## 2014-05-24 DIAGNOSIS — I252 Old myocardial infarction: Secondary | ICD-10-CM | POA: Diagnosis not present

## 2014-05-24 DIAGNOSIS — E785 Hyperlipidemia, unspecified: Secondary | ICD-10-CM | POA: Diagnosis present

## 2014-05-24 DIAGNOSIS — Z955 Presence of coronary angioplasty implant and graft: Secondary | ICD-10-CM | POA: Diagnosis not present

## 2014-05-24 DIAGNOSIS — R42 Dizziness and giddiness: Secondary | ICD-10-CM | POA: Diagnosis not present

## 2014-05-24 DIAGNOSIS — M1990 Unspecified osteoarthritis, unspecified site: Secondary | ICD-10-CM | POA: Diagnosis present

## 2014-05-24 DIAGNOSIS — R404 Transient alteration of awareness: Secondary | ICD-10-CM | POA: Diagnosis not present

## 2014-05-24 DIAGNOSIS — I10 Essential (primary) hypertension: Secondary | ICD-10-CM | POA: Diagnosis not present

## 2014-05-24 DIAGNOSIS — M47892 Other spondylosis, cervical region: Secondary | ICD-10-CM | POA: Diagnosis present

## 2014-05-24 DIAGNOSIS — D7589 Other specified diseases of blood and blood-forming organs: Secondary | ICD-10-CM | POA: Diagnosis present

## 2014-05-24 DIAGNOSIS — G459 Transient cerebral ischemic attack, unspecified: Secondary | ICD-10-CM | POA: Diagnosis not present

## 2014-05-24 DIAGNOSIS — R0989 Other specified symptoms and signs involving the circulatory and respiratory systems: Secondary | ICD-10-CM | POA: Diagnosis present

## 2014-05-24 DIAGNOSIS — I2510 Atherosclerotic heart disease of native coronary artery without angina pectoris: Secondary | ICD-10-CM | POA: Diagnosis not present

## 2014-05-24 DIAGNOSIS — M6281 Muscle weakness (generalized): Secondary | ICD-10-CM | POA: Diagnosis not present

## 2014-05-24 DIAGNOSIS — R079 Chest pain, unspecified: Secondary | ICD-10-CM | POA: Diagnosis not present

## 2014-05-24 DIAGNOSIS — R531 Weakness: Secondary | ICD-10-CM | POA: Diagnosis not present

## 2014-05-24 DIAGNOSIS — I509 Heart failure, unspecified: Secondary | ICD-10-CM | POA: Diagnosis present

## 2014-05-24 DIAGNOSIS — Z79899 Other long term (current) drug therapy: Secondary | ICD-10-CM | POA: Diagnosis not present

## 2014-05-24 DIAGNOSIS — G8191 Hemiplegia, unspecified affecting right dominant side: Secondary | ICD-10-CM | POA: Diagnosis present

## 2014-05-24 DIAGNOSIS — Z9621 Cochlear implant status: Secondary | ICD-10-CM | POA: Diagnosis present

## 2014-05-24 DIAGNOSIS — Z7982 Long term (current) use of aspirin: Secondary | ICD-10-CM | POA: Diagnosis not present

## 2014-05-24 DIAGNOSIS — E784 Other hyperlipidemia: Secondary | ICD-10-CM | POA: Diagnosis not present

## 2014-05-24 DIAGNOSIS — R008 Other abnormalities of heart beat: Secondary | ICD-10-CM | POA: Diagnosis present

## 2014-05-24 DIAGNOSIS — Z1389 Encounter for screening for other disorder: Secondary | ICD-10-CM | POA: Diagnosis not present

## 2014-05-25 DIAGNOSIS — R531 Weakness: Secondary | ICD-10-CM | POA: Insufficient documentation

## 2014-06-03 ENCOUNTER — Encounter (HOSPITAL_COMMUNITY): Payer: Self-pay | Admitting: Psychiatry

## 2014-06-03 ENCOUNTER — Ambulatory Visit (INDEPENDENT_AMBULATORY_CARE_PROVIDER_SITE_OTHER): Payer: Medicare Other | Admitting: Psychiatry

## 2014-06-03 ENCOUNTER — Telehealth: Payer: Self-pay | Admitting: Family Medicine

## 2014-06-03 VITALS — BP 103/59 | HR 62 | Ht 67.0 in | Wt 220.0 lb

## 2014-06-03 DIAGNOSIS — G8191 Hemiplegia, unspecified affecting right dominant side: Secondary | ICD-10-CM | POA: Diagnosis not present

## 2014-06-03 DIAGNOSIS — F4329 Adjustment disorder with other symptoms: Secondary | ICD-10-CM

## 2014-06-03 DIAGNOSIS — Z955 Presence of coronary angioplasty implant and graft: Secondary | ICD-10-CM | POA: Diagnosis not present

## 2014-06-03 DIAGNOSIS — I2510 Atherosclerotic heart disease of native coronary artery without angina pectoris: Secondary | ICD-10-CM | POA: Diagnosis not present

## 2014-06-03 DIAGNOSIS — Z79899 Other long term (current) drug therapy: Secondary | ICD-10-CM | POA: Diagnosis not present

## 2014-06-03 DIAGNOSIS — I10 Essential (primary) hypertension: Secondary | ICD-10-CM | POA: Diagnosis not present

## 2014-06-03 DIAGNOSIS — Z7982 Long term (current) use of aspirin: Secondary | ICD-10-CM | POA: Diagnosis not present

## 2014-06-03 DIAGNOSIS — F4381 Prolonged grief disorder: Secondary | ICD-10-CM

## 2014-06-03 DIAGNOSIS — I672 Cerebral atherosclerosis: Secondary | ICD-10-CM | POA: Diagnosis not present

## 2014-06-03 DIAGNOSIS — F331 Major depressive disorder, recurrent, moderate: Secondary | ICD-10-CM | POA: Diagnosis not present

## 2014-06-03 DIAGNOSIS — G4733 Obstructive sleep apnea (adult) (pediatric): Secondary | ICD-10-CM | POA: Diagnosis not present

## 2014-06-03 DIAGNOSIS — F411 Generalized anxiety disorder: Secondary | ICD-10-CM | POA: Diagnosis not present

## 2014-06-03 DIAGNOSIS — G459 Transient cerebral ischemic attack, unspecified: Secondary | ICD-10-CM | POA: Diagnosis not present

## 2014-06-03 DIAGNOSIS — R531 Weakness: Secondary | ICD-10-CM

## 2014-06-03 DIAGNOSIS — I639 Cerebral infarction, unspecified: Secondary | ICD-10-CM | POA: Diagnosis not present

## 2014-06-03 DIAGNOSIS — I6629 Occlusion and stenosis of unspecified posterior cerebral artery: Secondary | ICD-10-CM | POA: Diagnosis not present

## 2014-06-03 DIAGNOSIS — R4701 Aphasia: Secondary | ICD-10-CM | POA: Diagnosis not present

## 2014-06-03 DIAGNOSIS — E785 Hyperlipidemia, unspecified: Secondary | ICD-10-CM | POA: Diagnosis not present

## 2014-06-03 NOTE — Telephone Encounter (Signed)
Pt was released form [facility] on wed 5/25 and needs a referral for outpatient Physical [facility]- requesting to stay in [place] and takes Medicaid.

## 2014-06-03 NOTE — Progress Notes (Signed)
Psychiatric Initial Adult Assessment   Patient Identification: Carlos Harrington MRN:  [id number] Date of Evaluation:  06/03/2014 Referral Source: Dr. Ileene Rubens Primary care physician Chief Complaint:   Chief Complaint    Establish Care; Stress; Depression     Visit Diagnosis:    ICD-9-CM ICD-10-CM   1. MDD (major depressive disorder), recurrent episode, moderate 296.32 F33.1   2. 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 GAD (generalized anxiety disorder) 300.02 F41.1    Diagnosis:   Patient Active Problem List   Diagnosis Date Noted  . SOB (shortness of breath) [R06.02] 09/26/2013  . Fecal incontinence [R15.9] 08/28/2013  . Trochanteric bursitis of left hip [M70.62] 08/26/2013  . Anxiety [F41.9] 05/05/2013  . GERD (gastroesophageal reflux disease) [K21.9] 03/24/2013  . BPH (benign prostatic hyperplasia) [N40.0] 12/17/2012  . Pre-diabetes [R73.09] 11/27/2012  . Right carotid bruit [R09.89] 09/24/2012  . Hypokalemia [E87.6] 09/24/2012  . HYPERTENSIVE RETINOPATHY [H35.039] 09/17/2009  . [place] [L38.101] 09/17/2009  . 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 B12 DEFICIENCY [E53.8] 07/29/2009  . DIARRHEA [R19.7] 07/29/2009  . THROMBOCYTOPENIA [D69.6] 06/24/2009  . CVA [I63.9] 06/15/2009  . Disturbance of skin sensation [R20.9] 06/08/2009  . Hyperlipidemia [E78.5] 05/28/2009  . OBESITY, CLASS I [E66.3] 05/28/2009  . DEPRESSION [F32.9] 05/28/2009  . MACULAR DEGENERATION [H35.30] 05/28/2009  . Essential hypertension, benign [I10] 05/28/2009  . CAD [I25.10] 05/28/2009  . ADHESIVE CAPSULITIS, RIGHT [M75.00] 05/28/2009   History of Present Illness:  Mr. Carlos Harrington is a 81 years old currently widowed Caucasian male who is now living with his daughter the patient has been referred by primary care physician for evaluation of depression.  He has been feeling low down, anhedonia. Has been going through grief reaction with feelings of hopelessness and despair  decreased motivation for the last 1 year since he lost his wife. Also complicated her medical complexity including difficulty hearing and recent stroke along with other medical issues see chart. He has been doing reasonable one and a half to 2 years ago he was mobile and not depressed. His wife that has added to his despair and he feels lonely withdrawn. Medications have been adjusted now is on lexapro  20 mg that has helped his depression is severity of depression is decreased is not feeling hopeless or suicidal.  Severity of depression; 5 out of 10. 10 being no depression  Aggravating factors; wife's death one year ago. Recent stroke and medical issues.  Modifying factors; his daughter and planning to learn sign language. 2 puppies  Associated symptoms; does not endorse delusions or hallucinations.  He does endorse excessive worries worries are excessive and unreasonable at times still thinking about his wife at times he has been feeling lonely since her that difficulty sleeping and decreased appetite.   Elements:  Location:  depression and anxiety. Quality:  moderate. Severity:  moderate. Timing:  relavant to his wife death 40. Duration:  more then an year. Context:  grief and medical complexity. Associated Signs/Symptoms: Depression Symptoms:  depressed mood, anhedonia, insomnia, difficulty concentrating, anxiety, loss of energy/fatigue, weight loss, (Hypo) Manic Symptoms:  Distractibility, Anxiety Symptoms:  Excessive Worry, Psychotic Symptoms:  denies PTSD Symptoms: NA  Past Medical History:  Past Medical History  Diagnosis Date  . CAD (coronary artery disease)     with stenting X 4 in [place]  . Hypertension   . Depression   . Anxiety   . Hyperlipidemia   . Macular degeneration     Past Surgical History  Procedure  Laterality Date  . Appendectomy    . Cholecystectomy    . Tonsillectomy     Family History:  Family History  Problem Relation Age of Onset  .  Hypertension Mother   . Hyperlipidemia Mother   . Diabetes Mother   . Other Mother     CVA  . Depression Mother   . Other Sister     brain tumor  . Other Brother     CVA   Social History:   History   Social History  . Marital Status: Married    Spouse Name: N/A  . Number of Children: N/A  . Years of Education: N/A   Social History Main Topics  . Smoking status: Never Smoker   . Smokeless tobacco: Not on file  . Alcohol Use: Yes     Comment: 2 daily  . Drug Use: No  . Sexual Activity: Not Currently   Other Topics Concern  . None   Social History Narrative   Additional Social History: Recent stroke may 2016 now on wheelchair. Cochlear implant pending that would help improve hearing and indirectly his depression  Musculoskeletal: Strength & Muscle Tone: within normal limits Gait & Station: ataxic Patient leans: Front  Psychiatric Specialty Exam: HPI  Review of Systems  Constitutional: Negative for fever.  Gastrointestinal: Negative for nausea.  Musculoskeletal: Positive for myalgias.  Skin: Negative for rash.  Neurological: Negative for tremors and headaches.  Psychiatric/Behavioral: Positive for depression. Negative for suicidal ideas and substance abuse.    Blood pressure 103/59, pulse 62, height 5\' 7"  (1.702 m), weight 220 lb (99.791 kg).Body mass index is 34.45 kg/(m^2).  General Appearance: Casual  Eye Contact:  Fair  Speech:  Slow  Volume:  Decreased  Mood:  Dysphoric  Affect:  Constricted  Thought Process:  Coherent  Orientation:  Full (Time, Place, and Person)  Thought Content:  Rumination  Suicidal Thoughts:  No  Homicidal Thoughts:  No  Memory:  Immediate;   Fair Recent;   Fair  Judgement:  Fair  Insight:  Shallow  Psychomotor Activity:  Decreased  Concentration:  Fair  Recall:  [employer] of Knowledge:Fair  Language: Fair  Akathisia:  Negative  Handed:  Right  AIMS (if indicated):    Assets:  Desire for  Improvement Vocational/Educational  ADL's:  Impaired with recent stroke. On wheelchair  Cognition: WNL  Sleep:  5 to 6 hours   Is the patient at risk to self?  No. Has the patient been a risk to self in the past 6 months?  No. Has the patient been a risk to self within the distant past?  Yes.   Is the patient a risk to others?  No. Has the patient been a risk to others in the past 6 months?  No. Has the patient been a risk to others within the distant past?  No.  Allergies:  No Known Allergies Current Medications: Current Outpatient Prescriptions  Medication Sig Dispense Refill  . amLODipine (NORVASC) 5 MG tablet Take 5 mg by mouth daily.    [name] aspirin EC 81 MG tablet Take 1 tablet (81 mg total) by mouth daily. 90 tablet 2  . atorvastatin (LIPITOR) 80 MG tablet Take 1 tablet (80 mg total) by mouth daily.    . benzonatate (TESSALON) 100 MG capsule Take 1 capsule (100 mg total) by mouth 2 (two) times daily as needed for cough. 20 capsule 0  . carvedilol (COREG) 6.25 MG tablet Take 1 tablet (6.25 mg total)  by mouth 2 (two) times daily with a meal. 60 tablet 3  . diazepam (VALIUM) 5 MG tablet Take 0.5-1 tablets (2.5-5 mg total) by mouth every 12 (twelve) hours as needed for anxiety. Only under the supervision of a non-sedated companion. 15 tablet 0  . doxazosin (CARDURA) 4 MG tablet Take 1 tablet (4 mg total) by mouth at bedtime. 90 tablet 5  . escitalopram (LEXAPRO) 20 MG tablet Take 1 tablet (20 mg total) by mouth daily. 30 tablet 5  . Fluticasone Furoate-Vilanterol (BREO ELLIPTA) 100-25 MCG/INH AEPB Inhale 1 puff into the lungs daily. To prevent cough. 60 each 2  . furosemide (LASIX) 20 MG tablet Take 1 tablet (20 mg total) by mouth 2 (two) times daily. 60 tablet 3  . nitroGLYCERIN (NITROSTAT) 0.4 MG SL tablet Place 1 tablet (0.4 mg total) under the tongue every 5 (five) minutes as needed for chest pain. 1 tablet 0  . omeprazole (PRILOSEC) 40 MG capsule One by mouth daily at least one hour  before a meal. 30 capsule 4  . ramipril (ALTACE) 10 MG capsule TAKE ONE CAPSULE BY MOUTH 2 TIMES A DAY 60 capsule 5  . spironolactone (ALDACTONE) 25 MG tablet     . tamsulosin (FLOMAX) 0.4 MG CAPS capsule Take 1 capsule (0.4 mg total) by mouth daily. 30 capsule 5  . ticagrelor (BRILINTA) 90 MG TABS tablet Take 1 tablet (90 mg total) by mouth 2 (two) times daily. 60 tablet   . Tiotropium Bromide Monohydrate (SPIRIVA RESPIMAT) 2.5 MCG/ACT AERS Two inhalations daily to help prevent cough and shortness of breath. 1 Inhaler 2  . traMADol (ULTRAM) 50 MG tablet 1-2 tabs by mouth Q8 hours, maximum 6 tabs per day. 90 tablet 0  . traZODone (DESYREL) 50 MG tablet Take 1 tablet (50 mg total) by mouth at bedtime as needed for sleep. 30 tablet 3   No current facility-administered medications for this visit.    Previous Psychotropic Medications: see HPI. Has been on dizepam for anxiety.   Substance Abuse History in the last 12 months:  Yes.   alcohol use infrequent . Martini glass a day or every other day. No withdrawals or tolerance. Has not used for last 10 days  Consequences of Substance Abuse:   Medical Decision Making:  Review of Psycho-Social Stressors (1), Review and summation of old records (2) and Review of Medication Regimen & Side Effects (2)  Treatment Plan Summary: Medication management and Plan of Care. Continue lexapro 20mg  for depression and anxiety Can take diazepam if needed only for acute anxiety. Would consider adding wellbutrin if needed. As of now he is getting cochlear implant and will see how it effects his mood. Also recovering from Stroke Reviewed Labs and notes . Collaborative info from daughter, who is now becoming his care giver Psychotherapy for Grief  And referrals given Follow up in 4 weeks.     Suellen Durocher 6/1/201611:21 AM

## 2014-06-03 NOTE — Telephone Encounter (Signed)
Referral placed.

## 2014-06-04 DIAGNOSIS — M4806 Spinal stenosis, lumbar region: Secondary | ICD-10-CM | POA: Diagnosis not present

## 2014-06-04 DIAGNOSIS — G459 Transient cerebral ischemic attack, unspecified: Secondary | ICD-10-CM | POA: Diagnosis not present

## 2014-06-04 DIAGNOSIS — M47816 Spondylosis without myelopathy or radiculopathy, lumbar region: Secondary | ICD-10-CM | POA: Diagnosis not present

## 2014-06-05 DIAGNOSIS — G459 Transient cerebral ischemic attack, unspecified: Secondary | ICD-10-CM | POA: Diagnosis not present

## 2014-06-07 DIAGNOSIS — I10 Essential (primary) hypertension: Secondary | ICD-10-CM | POA: Diagnosis not present

## 2014-06-07 DIAGNOSIS — I2510 Atherosclerotic heart disease of native coronary artery without angina pectoris: Secondary | ICD-10-CM | POA: Diagnosis not present

## 2014-06-07 DIAGNOSIS — Z8673 Personal history of transient ischemic attack (TIA), and cerebral infarction without residual deficits: Secondary | ICD-10-CM | POA: Diagnosis not present

## 2014-06-07 DIAGNOSIS — Z9181 History of falling: Secondary | ICD-10-CM | POA: Diagnosis not present

## 2014-06-08 DIAGNOSIS — H903 Sensorineural hearing loss, bilateral: Secondary | ICD-10-CM | POA: Diagnosis not present

## 2014-06-09 DIAGNOSIS — Z8673 Personal history of transient ischemic attack (TIA), and cerebral infarction without residual deficits: Secondary | ICD-10-CM | POA: Diagnosis not present

## 2014-06-09 DIAGNOSIS — Z9181 History of falling: Secondary | ICD-10-CM | POA: Diagnosis not present

## 2014-06-09 DIAGNOSIS — I10 Essential (primary) hypertension: Secondary | ICD-10-CM | POA: Diagnosis not present

## 2014-06-09 DIAGNOSIS — I2510 Atherosclerotic heart disease of native coronary artery without angina pectoris: Secondary | ICD-10-CM | POA: Diagnosis not present

## 2014-06-10 DIAGNOSIS — I2510 Atherosclerotic heart disease of native coronary artery without angina pectoris: Secondary | ICD-10-CM | POA: Diagnosis not present

## 2014-06-10 DIAGNOSIS — G459 Transient cerebral ischemic attack, unspecified: Secondary | ICD-10-CM | POA: Diagnosis not present

## 2014-06-10 DIAGNOSIS — I252 Old myocardial infarction: Secondary | ICD-10-CM | POA: Diagnosis not present

## 2014-06-11 DIAGNOSIS — I10 Essential (primary) hypertension: Secondary | ICD-10-CM | POA: Diagnosis not present

## 2014-06-11 DIAGNOSIS — I2510 Atherosclerotic heart disease of native coronary artery without angina pectoris: Secondary | ICD-10-CM | POA: Diagnosis not present

## 2014-06-11 DIAGNOSIS — Z8673 Personal history of transient ischemic attack (TIA), and cerebral infarction without residual deficits: Secondary | ICD-10-CM | POA: Diagnosis not present

## 2014-06-11 DIAGNOSIS — Z9181 History of falling: Secondary | ICD-10-CM | POA: Diagnosis not present

## 2014-06-15 DIAGNOSIS — H903 Sensorineural hearing loss, bilateral: Secondary | ICD-10-CM | POA: Diagnosis not present

## 2014-06-16 DIAGNOSIS — I2510 Atherosclerotic heart disease of native coronary artery without angina pectoris: Secondary | ICD-10-CM | POA: Diagnosis not present

## 2014-06-16 DIAGNOSIS — Z9181 History of falling: Secondary | ICD-10-CM | POA: Diagnosis not present

## 2014-06-16 DIAGNOSIS — Z8673 Personal history of transient ischemic attack (TIA), and cerebral infarction without residual deficits: Secondary | ICD-10-CM | POA: Diagnosis not present

## 2014-06-16 DIAGNOSIS — I10 Essential (primary) hypertension: Secondary | ICD-10-CM | POA: Diagnosis not present

## 2014-06-17 ENCOUNTER — Other Ambulatory Visit: Payer: Self-pay | Admitting: Family Medicine

## 2014-06-17 ENCOUNTER — Ambulatory Visit: Payer: Medicare Other | Admitting: Physical Therapy

## 2014-06-18 DIAGNOSIS — I10 Essential (primary) hypertension: Secondary | ICD-10-CM | POA: Diagnosis not present

## 2014-06-18 DIAGNOSIS — I2510 Atherosclerotic heart disease of native coronary artery without angina pectoris: Secondary | ICD-10-CM | POA: Diagnosis not present

## 2014-06-18 DIAGNOSIS — Z8673 Personal history of transient ischemic attack (TIA), and cerebral infarction without residual deficits: Secondary | ICD-10-CM | POA: Diagnosis not present

## 2014-06-18 DIAGNOSIS — Z9181 History of falling: Secondary | ICD-10-CM | POA: Diagnosis not present

## 2014-06-24 ENCOUNTER — Telehealth: Payer: Self-pay | Admitting: *Deleted

## 2014-06-24 DIAGNOSIS — I10 Essential (primary) hypertension: Secondary | ICD-10-CM | POA: Diagnosis not present

## 2014-06-24 DIAGNOSIS — Z8673 Personal history of transient ischemic attack (TIA), and cerebral infarction without residual deficits: Secondary | ICD-10-CM | POA: Diagnosis not present

## 2014-06-24 DIAGNOSIS — I2510 Atherosclerotic heart disease of native coronary artery without angina pectoris: Secondary | ICD-10-CM | POA: Diagnosis not present

## 2014-06-24 DIAGNOSIS — Z9181 History of falling: Secondary | ICD-10-CM | POA: Diagnosis not present

## 2014-06-24 NOTE — Telephone Encounter (Signed)
Pt daughter called and states they wanted to verify potassium dose. She states she picked up a rx from the pharm and he was to take 40 meq bid of potassium. She felt like this was a lot to take. Called and let [name]( daughter) know that we have not rx'ed potassium for this pt since 2014. Daughter will check with the pharm to see who the rx came from

## 2014-06-25 DIAGNOSIS — Z8673 Personal history of transient ischemic attack (TIA), and cerebral infarction without residual deficits: Secondary | ICD-10-CM | POA: Diagnosis not present

## 2014-06-25 DIAGNOSIS — Z9181 History of falling: Secondary | ICD-10-CM | POA: Diagnosis not present

## 2014-06-25 DIAGNOSIS — I2510 Atherosclerotic heart disease of native coronary artery without angina pectoris: Secondary | ICD-10-CM | POA: Diagnosis not present

## 2014-06-25 DIAGNOSIS — I10 Essential (primary) hypertension: Secondary | ICD-10-CM | POA: Diagnosis not present

## 2014-06-30 DIAGNOSIS — I10 Essential (primary) hypertension: Secondary | ICD-10-CM | POA: Diagnosis not present

## 2014-06-30 DIAGNOSIS — I2510 Atherosclerotic heart disease of native coronary artery without angina pectoris: Secondary | ICD-10-CM | POA: Diagnosis not present

## 2014-06-30 DIAGNOSIS — Z8673 Personal history of transient ischemic attack (TIA), and cerebral infarction without residual deficits: Secondary | ICD-10-CM | POA: Diagnosis not present

## 2014-06-30 DIAGNOSIS — Z9181 History of falling: Secondary | ICD-10-CM | POA: Diagnosis not present

## 2014-07-02 DIAGNOSIS — Z9181 History of falling: Secondary | ICD-10-CM | POA: Diagnosis not present

## 2014-07-02 DIAGNOSIS — I2510 Atherosclerotic heart disease of native coronary artery without angina pectoris: Secondary | ICD-10-CM | POA: Diagnosis not present

## 2014-07-02 DIAGNOSIS — I10 Essential (primary) hypertension: Secondary | ICD-10-CM | POA: Diagnosis not present

## 2014-07-02 DIAGNOSIS — Z8673 Personal history of transient ischemic attack (TIA), and cerebral infarction without residual deficits: Secondary | ICD-10-CM | POA: Diagnosis not present

## 2014-07-08 ENCOUNTER — Ambulatory Visit: Payer: Medicare Other | Admitting: Family Medicine

## 2014-07-13 ENCOUNTER — Other Ambulatory Visit: Payer: Self-pay | Admitting: Family Medicine

## 2014-07-15 ENCOUNTER — Ambulatory Visit (HOSPITAL_COMMUNITY): Payer: Self-pay | Admitting: Psychiatry

## 2014-07-16 ENCOUNTER — Ambulatory Visit (INDEPENDENT_AMBULATORY_CARE_PROVIDER_SITE_OTHER): Payer: Medicare Other | Admitting: Family Medicine

## 2014-07-16 ENCOUNTER — Encounter: Payer: Self-pay | Admitting: Family Medicine

## 2014-07-16 VITALS — BP 156/64 | HR 47 | Wt 216.0 lb

## 2014-07-16 DIAGNOSIS — I639 Cerebral infarction, unspecified: Secondary | ICD-10-CM

## 2014-07-16 DIAGNOSIS — M7989 Other specified soft tissue disorders: Secondary | ICD-10-CM | POA: Diagnosis not present

## 2014-07-16 DIAGNOSIS — M799 Soft tissue disorder, unspecified: Secondary | ICD-10-CM | POA: Diagnosis not present

## 2014-07-16 NOTE — Progress Notes (Signed)
CC: Carlos Harrington is a 81 y.o. male is here for f/u physical therapy   Subjective: HPI:  Complains of right lower extremity swelling that has been present ever since he injured his shin after bumping into the steel frame of his bed. He had some mild bleeding where 3 puncture wounds were ever since then the leg has been getting slowly but progressively more swollen. It does not cause any pain. He denies any discoloration of the leg other than at the site of the puncture where there is now 3 scabs. No interventions as of yet. He denies swelling elsewhere in the body. He denies chest pain, irregular heartbeat, shortness of breath, orthopnea nor new joint pain. He denies any weakness in the right lower extremity. Recent medical history is significant for TIA that occurred in early June that caused him to have right lower extremity weakness which he tells me has fully resolved.  Denies fevers, chills, abdominal pain, nor urinary retention.  Review Of Systems Outlined In HPI  Past Medical History  Diagnosis Date  . CAD (coronary artery disease)     with stenting X 4 in [place]  . Hypertension   . Depression   . Anxiety   . Hyperlipidemia   . Macular degeneration     Past Surgical History  Procedure Laterality Date  . Appendectomy    . Cholecystectomy    . Tonsillectomy     Family History  Problem Relation Age of Onset  . Hypertension Mother   . Hyperlipidemia Mother   . Diabetes Mother   . Other Mother     CVA  . Depression Mother   . Other Sister     brain tumor  . Other Brother     CVA    History   Social History  . Marital Status: Married    Spouse Name: N/A  . Number of Children: N/A  . Years of Education: N/A   Occupational History  . Not on file.   Social History Main Topics  . Smoking status: Never Smoker   . Smokeless tobacco: Not on file  . Alcohol Use: Yes     Comment: 2 daily  . Drug Use: No  . Sexual Activity: Not Currently   Other Topics Concern  . Not on  file   Social History Narrative     Objective: BP 156/64 mmHg  Pulse 47  Wt 216 lb (97.977 kg)  General: Alert and Oriented, No Acute Distress HEENT: Pupils equal, round, reactive to light. Conjunctivae clear.  Moist mucous membranes Lungs: Clear to auscultation bilaterally, no wheezing/ronchi/rales.  Comfortable work of breathing. Good air movement. Cardiac: Regular rate and rhythm. Normal S1/S2.  No murmurs, rubs, nor gallops.   Abdomen:soft obese nontender Extremities: No peripheral edema in the left leg, the right leg has mild to moderate swelling from the shin distally with no pitting.  Strong peripheral pulses.  Mental Status: No depression, anxiety, nor agitation. Skin: Warm and dry. 3 separate half centimeter scabs on the anterior right shin with no signs of infection  Assessment & Plan: Hubert was seen today for f/u physical therapy.  Diagnoses and all orders for this visit:  Swelling of right lower extremity Orders: -     US Venous Img Lower Unilateral Right   Swelling of his right shin could be simply due to the inflammation from his healing puncture wounds however cannot rule out DVT therefore sent to have a urgent venous ultrasound, the plan will be based on  the above results.  Return if symptoms worsen or fail to improve.

## 2014-07-17 ENCOUNTER — Telehealth: Payer: Self-pay | Admitting: Family Medicine

## 2014-07-17 DIAGNOSIS — R21 Rash and other nonspecific skin eruption: Secondary | ICD-10-CM

## 2014-07-17 DIAGNOSIS — H903 Sensorineural hearing loss, bilateral: Secondary | ICD-10-CM | POA: Diagnosis not present

## 2014-07-17 NOTE — Telephone Encounter (Signed)
Carlos Harrington, Will you please let patient know that his ultrasound showed no signs of a blood clot.  This is reassuring.  I suspect the swelling in his leg is due to inflammation from his healing skin punctures. I'd only recommend keeping the leg elevated while resting to help reduce swelling.

## 2014-07-17 NOTE — Telephone Encounter (Signed)
Pt's daughter notified and she also request referral to derm for pt because  The rash in his groin has come back

## 2014-07-17 NOTE — Telephone Encounter (Signed)
Derm referral placed per daughters request

## 2014-07-21 DIAGNOSIS — D294 Benign neoplasm of scrotum: Secondary | ICD-10-CM | POA: Diagnosis not present

## 2014-07-21 DIAGNOSIS — L280 Lichen simplex chronicus: Secondary | ICD-10-CM | POA: Diagnosis not present

## 2014-07-21 DIAGNOSIS — L304 Erythema intertrigo: Secondary | ICD-10-CM | POA: Diagnosis not present

## 2014-07-21 DIAGNOSIS — L22 Diaper dermatitis: Secondary | ICD-10-CM | POA: Diagnosis not present

## 2014-07-23 ENCOUNTER — Ambulatory Visit: Payer: Self-pay | Admitting: Family Medicine

## 2014-07-27 ENCOUNTER — Encounter: Payer: Self-pay | Admitting: Family Medicine

## 2014-07-27 ENCOUNTER — Ambulatory Visit (INDEPENDENT_AMBULATORY_CARE_PROVIDER_SITE_OTHER): Payer: Medicare Other | Admitting: Family Medicine

## 2014-07-27 VITALS — BP 135/64 | HR 57 | Wt 212.0 lb

## 2014-07-27 DIAGNOSIS — S0990XA Unspecified injury of head, initial encounter: Secondary | ICD-10-CM

## 2014-07-27 DIAGNOSIS — Z23 Encounter for immunization: Secondary | ICD-10-CM | POA: Diagnosis not present

## 2014-07-27 DIAGNOSIS — T148 Other injury of unspecified body region: Secondary | ICD-10-CM | POA: Diagnosis not present

## 2014-07-27 DIAGNOSIS — T148XXA Other injury of unspecified body region, initial encounter: Secondary | ICD-10-CM | POA: Insufficient documentation

## 2014-07-27 NOTE — Assessment & Plan Note (Signed)
Head injury. This isn't a 81 year old man. This is one-week-old. Ideally this should be evaluated with a noncontrast head CT to evaluate subdermal hematoma. Patient declined the exam today. I feel this is reasonable. We'll proceed with watchful waiting

## 2014-07-27 NOTE — Patient Instructions (Signed)
Thank you for coming in today. Return as needed.  Go to the emergency room if your headache becomes excruciating or you have weakness or numbness or uncontrolled vomiting.   Head Injury You have received a head injury. It does not appear serious at this time. Headaches and vomiting are common following head injury. It should be easy to awaken from sleeping. Sometimes it is necessary for you to stay in the emergency department for a while for observation. Sometimes admission to the hospital may be needed. After injuries such as yours, most problems occur within the first 24 hours, but side effects may occur up to 7-10 days after the injury. It is important for you to carefully monitor your condition and contact your health care provider or seek immediate medical care if there is a change in your condition. WHAT ARE THE TYPES OF HEAD INJURIES? Head injuries can be as minor as a bump. Some head injuries can be more severe. More severe head injuries include:  A jarring injury to the brain (concussion).  A bruise of the brain (contusion). This mean there is bleeding in the brain that can cause swelling.  A cracked skull (skull fracture).  Bleeding in the brain that collects, clots, and forms a bump (hematoma). WHAT CAUSES A HEAD INJURY? A serious head injury is most likely to happen to someone who is in a car wreck and is not wearing a seat belt. Other causes of major head injuries include bicycle or motorcycle accidents, sports injuries, and falls. HOW ARE HEAD INJURIES DIAGNOSED? A complete history of the event leading to the injury and your current symptoms will be helpful in diagnosing head injuries. Many times, pictures of the brain, such as CT or MRI are needed to see the extent of the injury. Often, an overnight hospital stay is necessary for observation.  WHEN SHOULD I SEEK IMMEDIATE MEDICAL CARE?  You should get help right away if:  You have confusion or drowsiness.  You feel sick to  your stomach (nauseous) or have continued, forceful vomiting.  You have dizziness or unsteadiness that is getting worse.  You have severe, continued headaches not relieved by medicine. Only take over-the-counter or prescription medicines for pain, fever, or discomfort as directed by your health care provider.  You do not have normal function of the arms or legs or are unable to walk.  You notice changes in the black spots in the center of the colored part of your eye (pupil).  You have a clear or bloody fluid coming from your nose or ears.  You have a loss of vision. During the next 24 hours after the injury, you must stay with someone who can watch you for the warning signs. This person should contact local emergency services (911 in the [place] if you have seizures, you become unconscious, or you are unable to wake up. HOW CAN I PREVENT A HEAD INJURY IN THE FUTURE? The most important factor for preventing major head injuries is avoiding motor vehicle accidents. To minimize the potential for damage to your head, it is crucial to wear seat belts while riding in motor vehicles. Wearing helmets while bike riding and playing collision sports (like football) is also helpful. Also, avoiding dangerous activities around the house will further help reduce your risk of head injury.  WHEN CAN I RETURN TO NORMAL ACTIVITIES AND ATHLETICS? You should be reevaluated by your health care provider before returning to these activities. If you have any of the following symptoms, you  should not return to activities or contact sports until 1 week after the symptoms have stopped:  Persistent headache.  Dizziness or vertigo.  Poor attention and concentration.  Confusion.  Memory problems.  Nausea or vomiting.  Fatigue or tire easily.  Irritability.  Intolerant of bright lights or loud noises.  Anxiety or depression.  Disturbed sleep. MAKE SURE YOU:   Understand these instructions.  Will watch  your condition.  Will get help right away if you are not doing well or get worse. Document Released: 12/19/2004 Document Revised: 12/24/2012 Document Reviewed: 08/26/2012 [facility] Patient Information 2015 [employer], [place]. This information is not intended to replace advice given to you by your health care provider. Make sure you discuss any questions you have with your health care provider.

## 2014-07-27 NOTE — Progress Notes (Signed)
Carlos Harrington is a 81 y.o. male who presents to [facility]  today for head injury. Patient slipped and fell off an exam table one week ago at a different doctor's office. He at the left side of his head against the [employer] stand. This displaced the cochlear implant. He feels well with no weakness or numbness or loss of function since the injury. The cochlear implant is working normally. He notes a small abrasion to the left arm that is healed nicely. He cannot recall his last tetanus vaccine.   Past Medical History  Diagnosis Date  . CAD (coronary artery disease)     with stenting X 4 in [place]  . Hypertension   . Depression   . Anxiety   . Hyperlipidemia   . Macular degeneration    Past Surgical History  Procedure Laterality Date  . Appendectomy    . Cholecystectomy    . Tonsillectomy     History  Substance Use Topics  . Smoking status: Never Smoker   . Smokeless tobacco: Not on file  . Alcohol Use: Yes     Comment: 2 daily   ROS as above Medications: Current Outpatient Prescriptions  Medication Sig Dispense Refill  . amLODipine (NORVASC) 5 MG tablet Take 5 mg by mouth daily.    [name] aspirin EC 81 MG tablet Take 1 tablet (81 mg total) by mouth daily. 90 tablet 2  . atorvastatin (LIPITOR) 40 MG tablet Take 1 tablet (40 mg total) by mouth daily. Follow up appointment for cholesterol check needed by end of July. 30 tablet 1  . atorvastatin (LIPITOR) 80 MG tablet Take 1 tablet (80 mg total) by mouth daily.    . benzonatate (TESSALON) 100 MG capsule Take 1 capsule (100 mg total) by mouth 2 (two) times daily as needed for cough. 20 capsule 0  . carvedilol (COREG) 6.25 MG tablet TAKE 1 TABLET BY MOUTH TWICE A DAY 60 tablet 5  . diazepam (VALIUM) 5 MG tablet Take 0.5-1 tablets (2.5-5 mg total) by mouth every 12 (twelve) hours as needed for anxiety. Only under the supervision of a non-sedated companion. 15 tablet 0  . doxazosin (CARDURA) 4 MG tablet Take 1 tablet  (4 mg total) by mouth at bedtime. 90 tablet 5  . escitalopram (LEXAPRO) 20 MG tablet Take 1 tablet (20 mg total) by mouth daily. 30 tablet 5  . Fluticasone Furoate-Vilanterol (BREO ELLIPTA) 100-25 MCG/INH AEPB Inhale 1 puff into the lungs daily. To prevent cough. 60 each 2  . furosemide (LASIX) 20 MG tablet Take 1 tablet (20 mg total) by mouth 2 (two) times daily. 60 tablet 3  . nitroGLYCERIN (NITROSTAT) 0.4 MG SL tablet Place 1 tablet (0.4 mg total) under the tongue every 5 (five) minutes as needed for chest pain. 1 tablet 0  . ramipril (ALTACE) 10 MG capsule TAKE ONE CAPSULE BY MOUTH 2 TIMES A DAY 60 capsule 5  . spironolactone (ALDACTONE) 25 MG tablet     . tamsulosin (FLOMAX) 0.4 MG CAPS capsule Take 1 capsule (0.4 mg total) by mouth daily. 30 capsule 5  . ticagrelor (BRILINTA) 90 MG TABS tablet Take 1 tablet (90 mg total) by mouth 2 (two) times daily. 60 tablet   . Tiotropium Bromide Monohydrate (SPIRIVA RESPIMAT) 2.5 MCG/ACT AERS Two inhalations daily to help prevent cough and shortness of breath. 1 Inhaler 2  . traMADol (ULTRAM) 50 MG tablet 1-2 tabs by mouth Q8 hours, maximum 6 tabs per day. 90 tablet  0  . traZODone (DESYREL) 50 MG tablet Take 1 tablet (50 mg total) by mouth at bedtime as needed for sleep. 30 tablet 3  . omeprazole (PRILOSEC) 40 MG capsule One by mouth daily at least one hour before a meal. 30 capsule 4   No current facility-administered medications for this visit.   No Known Allergies   Exam:  BP 135/64 mmHg  Pulse 57  Wt 212 lb (96.163 kg) Gen: Well NAD HEENT: EOMI,  MMM hard of hearing  Lungs: Normal work of breathing. CTABL Heart: RRR no MRG Abd: NABS, Soft. Nondistended, Nontender Exts: Brisk capillary refill, warm and well perfused.  Scalp, no contusions or trauma. Nontender Neck: Normal range of motion nontender Balance intact Normal gait Skin: Well-healing abrasion left upper arm. No erythema or discharge.  No results found for this or any previous  visit (from the past 24 hour(s)). No results found.   Please see individual assessment and plan sections.

## 2014-07-27 NOTE — Assessment & Plan Note (Signed)
Healing well. Tdap vaccine given today

## 2014-07-29 ENCOUNTER — Other Ambulatory Visit: Payer: Self-pay | Admitting: Family Medicine

## 2014-08-11 DIAGNOSIS — H903 Sensorineural hearing loss, bilateral: Secondary | ICD-10-CM | POA: Diagnosis not present

## 2014-08-26 DIAGNOSIS — R159 Full incontinence of feces: Secondary | ICD-10-CM | POA: Diagnosis not present

## 2014-08-26 DIAGNOSIS — R197 Diarrhea, unspecified: Secondary | ICD-10-CM | POA: Diagnosis not present

## 2014-08-27 ENCOUNTER — Other Ambulatory Visit: Payer: Self-pay | Admitting: Family Medicine

## 2014-09-15 DIAGNOSIS — H903 Sensorineural hearing loss, bilateral: Secondary | ICD-10-CM | POA: Diagnosis not present

## 2014-10-19 DIAGNOSIS — H903 Sensorineural hearing loss, bilateral: Secondary | ICD-10-CM | POA: Diagnosis not present

## 2014-10-20 DIAGNOSIS — H35373 Puckering of macula, bilateral: Secondary | ICD-10-CM | POA: Diagnosis not present

## 2014-10-20 DIAGNOSIS — H5211 Myopia, right eye: Secondary | ICD-10-CM | POA: Diagnosis not present

## 2014-10-20 DIAGNOSIS — H25811 Combined forms of age-related cataract, right eye: Secondary | ICD-10-CM | POA: Diagnosis not present

## 2014-10-20 DIAGNOSIS — Z961 Presence of intraocular lens: Secondary | ICD-10-CM | POA: Diagnosis not present

## 2014-10-20 DIAGNOSIS — H348312 Tributary (branch) retinal vein occlusion, right eye, stable: Secondary | ICD-10-CM | POA: Diagnosis not present

## 2014-10-20 DIAGNOSIS — H40031 Anatomical narrow angle, right eye: Secondary | ICD-10-CM | POA: Diagnosis not present

## 2014-10-20 DIAGNOSIS — H52223 Regular astigmatism, bilateral: Secondary | ICD-10-CM | POA: Diagnosis not present

## 2014-10-21 ENCOUNTER — Ambulatory Visit (INDEPENDENT_AMBULATORY_CARE_PROVIDER_SITE_OTHER): Payer: Medicare Other | Admitting: Family Medicine

## 2014-10-21 VITALS — Temp 98.5°F

## 2014-10-21 DIAGNOSIS — Z23 Encounter for immunization: Secondary | ICD-10-CM | POA: Diagnosis not present

## 2014-10-26 DIAGNOSIS — R159 Full incontinence of feces: Secondary | ICD-10-CM | POA: Diagnosis not present

## 2014-11-03 DIAGNOSIS — H903 Sensorineural hearing loss, bilateral: Secondary | ICD-10-CM | POA: Diagnosis not present

## 2014-11-17 ENCOUNTER — Ambulatory Visit (INDEPENDENT_AMBULATORY_CARE_PROVIDER_SITE_OTHER): Payer: Medicare Other | Admitting: Family Medicine

## 2014-11-17 ENCOUNTER — Encounter: Payer: Self-pay | Admitting: Family Medicine

## 2014-11-17 VITALS — BP 155/63 | HR 47 | Wt 215.0 lb

## 2014-11-17 DIAGNOSIS — I10 Essential (primary) hypertension: Secondary | ICD-10-CM

## 2014-11-17 DIAGNOSIS — F411 Generalized anxiety disorder: Secondary | ICD-10-CM | POA: Diagnosis not present

## 2014-11-17 DIAGNOSIS — Z23 Encounter for immunization: Secondary | ICD-10-CM | POA: Diagnosis not present

## 2014-11-17 DIAGNOSIS — I639 Cerebral infarction, unspecified: Secondary | ICD-10-CM

## 2014-11-17 MED ORDER — CALCIUM POLYCARBOPHIL 625 MG PO TABS: ORAL_TABLET | ORAL | Status: DC

## 2014-11-17 MED ORDER — RAMIPRIL 10 MG PO CAPS: ORAL_CAPSULE | ORAL | Status: DC

## 2014-11-17 MED ORDER — ESCITALOPRAM OXALATE 20 MG PO TABS: 20.0000 mg | ORAL_TABLET | Freq: Every day | ORAL | Status: DC

## 2014-11-17 NOTE — Progress Notes (Signed)
CC: Carlos Harrington is a 81 y.o. male is here for Medication Management   Subjective: HPI:  Follow-up essential hypertension: Compliance with medications has been around 90%. Compliance has improved with planning meds on a weekly basis. He denies chest pain shortness of breath orthopnea nor peripheral edema  Follow-up anxiety: His daughter states that he usually gets pretty anxious around the time of Christmas. Patient states that he feels fine right now without any anxiety or depression. He still misses his wife but it does not interfere with his quality of life. He recently moved in with his daughter and things are going well. Denies thoughts of wanting to harm self or others.  His hearing loss is only slightly better with her cochlear implant   Review Of Systems Outlined In HPI  Past Medical History  Diagnosis Date  . CAD (coronary artery disease)     with stenting X 4 in [place]  . Hypertension   . Depression   . Anxiety   . Hyperlipidemia   . Macular degeneration     Past Surgical History  Procedure Laterality Date  . Appendectomy    . Cholecystectomy    . Tonsillectomy     Family History  Problem Relation Age of Onset  . Hypertension Mother   . Hyperlipidemia Mother   . Diabetes Mother   . Other Mother     CVA  . Depression Mother   . Other Sister     brain tumor  . Other Brother     CVA    Social History   Social History  . Marital Status: Married    Spouse Name: N/A  . Number of Children: N/A  . Years of Education: N/A   Occupational History  . Not on file.   Social History Main Topics  . Smoking status: Never Smoker   . Smokeless tobacco: Not on file  . Alcohol Use: Yes     Comment: 2 daily  . Drug Use: No  . Sexual Activity: Not Currently   Other Topics Concern  . Not on file   Social History Narrative     Objective: BP 155/63 mmHg  Pulse 47  Wt 215 lb (97.523 kg)  General: Alert and Oriented, No Acute Distress HEENT: Pupils equal, round,  reactive to light. Conjunctivae clear.  Moist mucous membranes Lungs: Clear to auscultation bilaterally, no wheezing/ronchi/rales.  Comfortable work of breathing. Good air movement. Cardiac: Regular rate and rhythm. Normal S1/S2.  No murmurs, rubs, nor gallops.   Extremities: No peripheral edema.  Strong peripheral pulses.  Mental Status: No depression, anxiety, nor agitation. Skin: Warm and dry.  Assessment & Plan: Abrien was seen today for medication management.  Diagnoses and all orders for this visit:  Essential hypertension, benign -     ramipril (ALTACE) 10 MG capsule; TAKE ONE CAPSULE BY MOUTH 2 TIMES A DAY  Generalized anxiety disorder -     escitalopram (LEXAPRO) 20 MG tablet; Take 1 tablet (20 mg total) by mouth daily.  Need for prophylactic vaccination against Streptococcus pneumoniae (pneumococcus) -     Pneumococcal conjugate vaccine 13-valent  Other orders -     polycarbophil (FIBERCON) 625 MG tablet; One by mouth twice a day.   Essential hypertension: Uncontrolled chronic condition, discussed methods to help increase compliance including daughter checking on making sure he took all of his meds before he goes to bed every night. No change to current antihypertensive regimen. Anxiety: Controlled with Lexapro, continue current dose, he like to  stop this over joint decision to continue this at least through the winter. Adding historical medicine of FiberCon.  25 minutes spent face-to-face during visit today of which at least 50% was counseling or coordinating care regarding: 1. Essential hypertension, benign   2. Generalized anxiety disorder   3. Need for prophylactic vaccination against Streptococcus pneumoniae (pneumococcus)       Return in about 3 months (around 02/17/2015) for Blood Pressure Follow Up.

## 2014-12-20 ENCOUNTER — Emergency Department (INDEPENDENT_AMBULATORY_CARE_PROVIDER_SITE_OTHER): Payer: Medicare Other

## 2014-12-20 ENCOUNTER — Emergency Department (INDEPENDENT_AMBULATORY_CARE_PROVIDER_SITE_OTHER)
Admission: EM | Admit: 2014-12-20 | Discharge: 2014-12-20 | Disposition: A | Discharge status: Home / Self Care | Payer: Medicare Other | Source: Home / Self Care | Attending: Family Medicine | Admitting: Family Medicine

## 2014-12-20 ENCOUNTER — Encounter: Payer: Self-pay | Admitting: Emergency Medicine

## 2014-12-20 DIAGNOSIS — J069 Acute upper respiratory infection, unspecified: Secondary | ICD-10-CM | POA: Diagnosis not present

## 2014-12-20 DIAGNOSIS — I517 Cardiomegaly: Secondary | ICD-10-CM | POA: Diagnosis not present

## 2014-12-20 DIAGNOSIS — R062 Wheezing: Secondary | ICD-10-CM | POA: Diagnosis not present

## 2014-12-20 DIAGNOSIS — R0602 Shortness of breath: Secondary | ICD-10-CM | POA: Diagnosis not present

## 2014-12-20 MED ORDER — AEROCHAMBER PLUS W/MASK MISC: Status: DC

## 2014-12-20 MED ORDER — PREDNISONE 20 MG PO TABS: ORAL_TABLET | ORAL | Status: DC

## 2014-12-20 MED ORDER — AZITHROMYCIN 250 MG PO TABS: 250.0000 mg | ORAL_TABLET | Freq: Every day | ORAL | Status: DC

## 2014-12-20 MED ORDER — METHYLPREDNISOLONE SODIUM SUCC 40 MG IJ SOLR
80.0000 mg | Freq: Once | INTRAMUSCULAR | Status: AC
  Administered 2014-12-20: 80 mg via INTRAMUSCULAR

## 2014-12-20 MED ORDER — ALBUTEROL SULFATE HFA 108 (90 BASE) MCG/ACT IN AERS: 1.0000 | INHALATION_SPRAY | Freq: Four times a day (QID) | RESPIRATORY_TRACT | Status: DC | PRN

## 2014-12-20 MED ORDER — IPRATROPIUM-ALBUTEROL 0.5-2.5 (3) MG/3ML IN SOLN: 3.0000 mL | Freq: Four times a day (QID) | RESPIRATORY_TRACT | Status: DC

## 2014-12-20 MED ORDER — BENZONATATE 100 MG PO CAPS: 100.0000 mg | ORAL_CAPSULE | Freq: Three times a day (TID) | ORAL | Status: DC

## 2014-12-20 NOTE — ED Notes (Signed)
Pt states he has had pneumonia vaccine.

## 2014-12-20 NOTE — Discharge Instructions (Signed)
You may take 400-600mg Ibuprofen (Motrin) every 6-8 hours for fever and pain  °Alternate with Tylenol  °You may take 500mg Tylenol every 4-6 hours as needed for fever and pain  °Follow-up with your primary care provider next week for recheck of symptoms if not improving.  °Be sure to drink plenty of fluids and rest, at least 8hrs of sleep a night, preferably more while you are sick. °Return urgent care or go to closest ER if you cannot keep down fluids/signs of dehydration, fever not reducing with Tylenol, difficulty breathing/wheezing, stiff neck, worsening condition, or other concerns (see below)  °Please take antibiotics as prescribed and be sure to complete entire course even if you start to feel better to ensure infection does not come back. ° ° °Cool Mist Vaporizers °Vaporizers may help relieve the symptoms of a cough and cold. They add moisture to the air, which helps mucus to become thinner and less sticky. This makes it easier to breathe and cough up secretions. Cool mist vaporizers do not cause serious burns like hot mist vaporizers, which may also be called steamers or humidifiers. Vaporizers have not been proven to help with colds. You should not use a vaporizer if you are allergic to mold. °HOME CARE INSTRUCTIONS °· Follow the package instructions for the vaporizer. °· Do not use anything other than distilled water in the vaporizer. °· Do not run the vaporizer all of the time. This can cause mold or bacteria to grow in the vaporizer. °· Clean the vaporizer after each time it is used. °· Clean and dry the vaporizer well before storing it. °· Stop using the vaporizer if worsening respiratory symptoms develop. °  °This information is not intended to replace advice given to you by your health care provider. Make sure you discuss any questions you have with your health care provider. °  °Document Released: 09/16/2003 Document Revised: 12/24/2012 Document Reviewed: 05/08/2012 °Elsevier Interactive Patient  Education ©2016 [employer] ° °

## 2014-12-20 NOTE — ED Notes (Signed)
Pt c/o cough and wheezing x 2 days.  °

## 2014-12-20 NOTE — ED Provider Notes (Signed)
CSN: [id number]     Arrival date & time 12/20/14  1721 History   First MD Initiated Contact with Patient 12/20/14 1747     Chief Complaint  Patient presents with  . Cough   (Consider location/radiation/quality/duration/timing/severity/associated sxs/prior Treatment) HPI  Pt is an 81yo male brought to [facility] by his daughter for evaluation of a cough and wheeze that started 2 days ago.  Most of information was provided by family member as pt is hard of hearing.  Pt has had congestion and mild to moderately intermittent productive cough and worsening wheeze. Pt does not have a hx of COPD or asthma but has needed an inhaler in the past when he gets sick.  He is not sure where his inhaler is as he has not had to use it in over 1 year.  No sick contacts at home. No recent travel. No fever, chills, n/v/d.  Pt denies chest pain at this time.   Past Medical History  Diagnosis Date  . CAD (coronary artery disease)     with stenting X 4 in [place]  . Hypertension   . Depression   . Anxiety   . Hyperlipidemia   . Macular degeneration    Past Surgical History  Procedure Laterality Date  . Appendectomy    . Cholecystectomy    . Tonsillectomy     Family History  Problem Relation Age of Onset  . Hypertension Mother   . Hyperlipidemia Mother   . Diabetes Mother   . Other Mother     CVA  . Depression Mother   . Other Sister     brain tumor  . Other Brother     CVA   Social History  Substance Use Topics  . Smoking status: Never Smoker   . Smokeless tobacco: None  . Alcohol Use: Yes     Comment: 2 daily    Review of Systems  Constitutional: Negative for fever and chills.  HENT: Positive for congestion and rhinorrhea. Negative for ear pain, sinus pressure, sore throat, trouble swallowing and voice change.   Respiratory: Positive for cough, shortness of breath and wheezing.   Cardiovascular: Negative for chest pain and palpitations.  Gastrointestinal: Negative for nausea, vomiting, abdominal  pain and diarrhea.  Musculoskeletal: Negative for myalgias, back pain and arthralgias.  Skin: Negative for rash.    Allergies  Review of patient's allergies indicates no known allergies.  Home Medications   Prior to Admission medications   Medication Sig Start Date End Date Taking? Authorizing Provider  albuterol (PROVENTIL HFA;VENTOLIN HFA) 108 (90 BASE) MCG/ACT inhaler Inhale 1-2 puffs into the lungs every 6 (six) hours as needed for wheezing or shortness of breath. 12/20/14   Noland Fordyce, PA-C  amLODipine (NORVASC) 5 MG tablet TAKE ONE TABLET BY MOUTH EVERY DAY 08/10/14   Marcial Pacas, DO  aspirin EC 81 MG tablet Take 1 tablet (81 mg total) by mouth daily. 05/15/14   Sean Hommel, DO  atorvastatin (LIPITOR) 80 MG tablet Take 1 tablet (80 mg total) by mouth daily. 11/20/13   Sean Hommel, DO  azithromycin (ZITHROMAX) 250 MG tablet Take 1 tablet (250 mg total) by mouth daily. Take first 2 tablets together, then 1 every day until finished. 12/20/14   Noland Fordyce, PA-C  benzonatate (TESSALON) 100 MG capsule Take 1 capsule (100 mg total) by mouth every 8 (eight) hours. 12/20/14   Noland Fordyce, PA-C  BRILINTA 90 MG TABS tablet TAKE ONE TABLET BY MOUTH 2 TIMES A DAY 08/10/14  Sean Hommel, DO  carvedilol (COREG) 6.25 MG tablet TAKE 1 TABLET BY MOUTH TWICE A DAY 06/17/14   Sean Hommel, DO  diazepam (VALIUM) 5 MG tablet Take 0.5-1 tablets (2.5-5 mg total) by mouth every 12 (twelve) hours as needed for anxiety. Only under the supervision of a non-sedated companion. 02/23/14   Jade L Breeback, PA-C  doxazosin (CARDURA) 4 MG tablet Take 1 tablet (4 mg total) by mouth at bedtime. 05/15/14   Sean Hommel, DO  escitalopram (LEXAPRO) 20 MG tablet Take 1 tablet (20 mg total) by mouth daily. 11/17/14   Sean Hommel, DO  furosemide (LASIX) 20 MG tablet TAKE ONE TABLET BY MOUTH 2 TIMES A DAY. (AM & NOON) 08/10/14   Sean Hommel, DO  polycarbophil (FIBERCON) 625 MG tablet One by mouth twice a day. 11/17/14   Marcial Pacas,  DO  predniSONE (DELTASONE) 20 MG tablet 3 tabs po day one, then 2 po daily x 4 days 12/20/14   Noland Fordyce, PA-C  ramipril (ALTACE) 10 MG capsule TAKE ONE CAPSULE BY MOUTH 2 TIMES A DAY 11/17/14   Marcial Pacas, DO  Spacer/Aero-Holding Chambers (AEROCHAMBER PLUS WITH MASK) inhaler Use as instructed 12/20/14   Noland Fordyce, PA-C  spironolactone (ALDACTONE) 25 MG tablet  06/25/13   Historical Provider, MD  vitamin B-12 (CYANOCOBALAMIN) 1000 MCG tablet TAKE ONE TABLET BY MOUTH EVERY DAY 08/10/14   Marcial Pacas, DO   Meds Ordered and Administered this Visit   Medications  methylPREDNISolone sodium succinate (SOLU-MEDROL) 40 mg/mL injection 80 mg (80 mg Intramuscular Given 12/20/14 1820)    BP 119/63 mmHg  Pulse 71  Temp(Src) 97.4 F (36.3 C) (Oral)  SpO2 96% No data found.   Physical Exam  Constitutional: He appears well-developed and well-nourished.  Sitting on exam table, NAD.  HENT:  Head: Normocephalic and atraumatic.  Right Ear: Hearing, tympanic membrane, external ear and ear canal normal.  Left Ear: Hearing, tympanic membrane, external ear and ear canal normal.  Nose: Nose normal.  Mouth/Throat: Uvula is midline, oropharynx is clear and moist and mucous membranes are normal.  Eyes: Conjunctivae are normal. No scleral icterus.  Neck: Normal range of motion. Neck supple.  Cardiovascular: Normal rate, regular rhythm and normal heart sounds.   Pulmonary/Chest: Effort normal. No respiratory distress. He has wheezes. He has no rales. He exhibits no tenderness.  Inspiratory and expiratory wheeze throughout. No respiratory distress. No use of accessory muscles.   Abdominal: Soft. Bowel sounds are normal. He exhibits no distension and no mass. There is no tenderness. There is no rebound and no guarding.  Musculoskeletal: Normal range of motion.  Neurological: He is alert.  Skin: Skin is warm and dry.  Nursing note and vitals reviewed.   ED Course  Procedures (including critical care  time)  Labs Review Labs Reviewed - No data to display  Imaging Review Dg Chest 2 View  12/20/2014  CLINICAL DATA:  Shortness of breath with chest congestion and wheezing since Friday. History of hypertension and coronary artery disease. EXAM: CHEST  2 VIEW COMPARISON:  Chest x-ray dated 11/21/2013. FINDINGS: Mild cardiomegaly is unchanged. Overall cardiomediastinal silhouette is stable in size and configuration. There is probable mild scarring/fibrosis at each lung base. No new lung findings seen. No evidence of pneumonia. No pleural effusion. No pneumothorax. Mild degenerative change again noted within the thoracic spine. Osseous structures about the chest are otherwise unremarkable. IMPRESSION: Stable mild cardiomegaly. No evidence of acute cardiopulmonary abnormality. No evidence of congestive heart failure. No evidence  of pneumonia. Electronically Signed   By: Franki Cabot M.D.   On: 12/20/2014 17:52      MDM   1. Acute upper respiratory infection   2. Wheeze      Pt brought in by family member for evaluation of cough and wheeze. Pt is afebrile, O2 Sat 93% on RA.  Denies chest pain.  Diffuse inspiratory and expiratory wheeze on exam.  CXR: no evidence of acute cardiopulmonary abnormality. No evidence of CHF or pneumonia.  Due to wheeze, pt was given 2 nebulizer treatments and solumedrol 80mg  IM  Wheeze did improve slightly after treatment.  Pt states he is feeling better and daughter states pt's voice does not sound as raspy after the treatments. O2 Sat 96% on RA.   Pt and daughter feel comfortable having pt discharged home. Rx: Azithromycin, prednisone, albuterol inhaler with spacer and tessalon. Advised to start prednisone in the morning as he has already had his dose of steroids this evening.   Discussed symptoms that warrant emergent care in the ED. F/u with PCP in 3-4 days if not improving.  Pt and daughter verbalized understanding and agreement with tx plan.    Noland Fordyce, PA-C 12/21/14 [id number]  Noland Fordyce, PA-C 12/21/14 [phone]

## 2014-12-22 ENCOUNTER — Ambulatory Visit (INDEPENDENT_AMBULATORY_CARE_PROVIDER_SITE_OTHER): Payer: Medicare Other | Admitting: Family Medicine

## 2014-12-22 ENCOUNTER — Encounter: Payer: Self-pay | Admitting: Family Medicine

## 2014-12-22 VITALS — BP 155/72 | HR 81 | Temp 98.2°F | Wt 211.0 lb

## 2014-12-22 DIAGNOSIS — J219 Acute bronchiolitis, unspecified: Secondary | ICD-10-CM | POA: Diagnosis not present

## 2014-12-22 DIAGNOSIS — I639 Cerebral infarction, unspecified: Secondary | ICD-10-CM

## 2014-12-22 MED ORDER — IPRATROPIUM-ALBUTEROL 0.5-2.5 (3) MG/3ML IN SOLN: 3.0000 mL | RESPIRATORY_TRACT | Status: DC | PRN

## 2014-12-22 MED ORDER — PREDNISONE 20 MG PO TABS: ORAL_TABLET | ORAL | Status: AC

## 2014-12-22 NOTE — Progress Notes (Signed)
CC: Carlos Harrington is a 81 y.o. male is here for URI   Subjective: HPI:  Ever since Saturday this past week and he's been experiencing cough, wheezing and some fatigue. He's having difficulty sleeping due to the cough. It is slightly improved with albuterol. He's been given azithromycin, Tessalon Perles and prednisone but this did not help. Additionally Solu-Medrol injection has not helped. Symptoms are present all hours of day and improved in cold weather. He denies any fevers, chills, nor shortness of breath. He denies sore throat or facial pain. Symptoms are moderate in severity and no better after urgent care 2 days ago   Review Of Systems Outlined In HPI  Past Medical History  Diagnosis Date  . CAD (coronary artery disease)     with stenting X 4 in [place]  . Hypertension   . Depression   . Anxiety   . Hyperlipidemia   . Macular degeneration     Past Surgical History  Procedure Laterality Date  . Appendectomy    . Cholecystectomy    . Tonsillectomy     Family History  Problem Relation Age of Onset  . Hypertension Mother   . Hyperlipidemia Mother   . Diabetes Mother   . Other Mother     CVA  . Depression Mother   . Other Sister     brain tumor  . Other Brother     CVA    Social History   Social History  . Marital Status: Married    Spouse Name: N/A  . Number of Children: N/A  . Years of Education: N/A   Occupational History  . Not on file.   Social History Main Topics  . Smoking status: Never Smoker   . Smokeless tobacco: Not on file  . Alcohol Use: Yes     Comment: 2 daily  . Drug Use: No  . Sexual Activity: Not Currently   Other Topics Concern  . Not on file   Social History Narrative     Objective: BP 155/72 mmHg  Pulse 81  Temp(Src) 98.2 F (36.8 C) (Oral)  Wt 211 lb (95.709 kg)  SpO2 93%  General: Alert and Oriented, No Acute Distress HEENT: Pupils equal, round, reactive to light. Conjunctivae clear.  External ears unremarkable, canals clear  with intact TMs with appropriate landmarks.  Middle ear appears open without effusion. Pink inferior turbinates.  Moist mucous membranes, pharynx without inflammation nor lesions.  Neck supple without palpable lymphadenopathy nor abnormal masses. Lungs: frequent coughing with end expiratory wheezingand rhonchi. Comfortable work of breathing no crackles Cardiac: Regular rate and rhythm. Normal S1/S2.  No murmurs, rubs, nor gallops.   Extremities: No peripheral edema.  Strong peripheral pulses.  Mental Status: No depression, anxiety, nor agitation. Skin: Warm and dry.  Assessment & Plan: Moses was seen today for uri.  Diagnoses and all orders for this visit:  Acute bronchiolitis due to unspecified organism -     predniSONE (DELTASONE) 20 MG tablet; Three tabs daily days 1-3, two tabs daily days 4-6, one tab daily days 7-9, half tab daily days 10-13. -     ipratropium-albuterol (DUONEB) 0.5-2.5 (3) MG/3ML SOLN; Take 3 mLs by nebulization every 4 (four) hours as needed (cough or wheezing).   He was given a DuoNeb Treatment and felt much better and sounded about 75% better. He was also given a nebulizer and a prescription for DuoNeb's. I'd like to alter his prednisone regimen and will be changing it to what's listed above. I've  asked him to call me on Thursday and if no better the next step would be to send him Levaquin.  25 minutes spent face-to-face during visit today of which at least 50% was counseling or coordinating care regarding: 1. Acute bronchiolitis due to unspecified organism      Return if symptoms worsen or fail to improve.

## 2014-12-24 ENCOUNTER — Encounter: Payer: Self-pay | Admitting: Sports Medicine

## 2014-12-24 ENCOUNTER — Ambulatory Visit (INDEPENDENT_AMBULATORY_CARE_PROVIDER_SITE_OTHER): Payer: Medicare Other

## 2014-12-24 ENCOUNTER — Ambulatory Visit (INDEPENDENT_AMBULATORY_CARE_PROVIDER_SITE_OTHER): Payer: Medicare Other | Admitting: Sports Medicine

## 2014-12-24 VITALS — BP 151/61 | HR 57 | Temp 97.8°F | Resp 18 | Wt 216.1 lb

## 2014-12-24 DIAGNOSIS — J209 Acute bronchitis, unspecified: Secondary | ICD-10-CM

## 2014-12-24 DIAGNOSIS — M5416 Radiculopathy, lumbar region: Secondary | ICD-10-CM

## 2014-12-24 DIAGNOSIS — R05 Cough: Secondary | ICD-10-CM | POA: Diagnosis not present

## 2014-12-24 DIAGNOSIS — M4317 Spondylolisthesis, lumbosacral region: Secondary | ICD-10-CM | POA: Diagnosis not present

## 2014-12-24 DIAGNOSIS — I639 Cerebral infarction, unspecified: Secondary | ICD-10-CM | POA: Diagnosis not present

## 2014-12-24 DIAGNOSIS — M47816 Spondylosis without myelopathy or radiculopathy, lumbar region: Secondary | ICD-10-CM | POA: Diagnosis not present

## 2014-12-24 MED ORDER — DOXYCYCLINE HYCLATE 100 MG PO TABS: 100.0000 mg | ORAL_TABLET | Freq: Two times a day (BID) | ORAL | Status: AC

## 2014-12-24 MED ORDER — FLUTICASONE FUROATE-VILANTEROL 100-25 MCG/INH IN AEPB: 1.0000 | INHALATION_SPRAY | Freq: Every day | RESPIRATORY_TRACT | Status: DC

## 2014-12-24 NOTE — Assessment & Plan Note (Addendum)
Has been aggressively treated, Solu-Medrol in urgent care, azithromycin. More recently an additional course of prednisone with Dr. Ileene Rubens as well as duo nebs which seems to be helping. Persistent cough and wheeze, not unexpected, continue prednisone, adding doxycycline, continue DuoNeb's, adding a chest x-ray and Breo.  He is stable, not in respiratory distress, not using accessory muscles and without nasal flaring, speaking in full sentences. O2 saturation is good. He is having significant weakness constitutional symptoms so I am when to check some blood work including blood cultures.

## 2014-12-24 NOTE — Progress Notes (Signed)
  Subjective:    CC:  Follow-up  HPI: Cough: [place] was seen in urgent care and by Dr. Ileene Rubens, He was treated with steroids and bronchodilators, symptoms are fairly stable, he is able to speak in full sentences and has no respiratory distress, his daughter was more concerned than anything else, he also wanted to discuss some back pain, moderate, persistent, worse with coughing, left-sided with radiation down the left leg. No bowel or bladder dysfunction, no saddle numbness, no constitutional symptoms with the exception of weakness.   Past medical history, Surgical history, Family history not pertinant except as noted below, Social history, Allergies, and medications have been entered into the medical record, reviewed, and no changes needed.   Review of Systems: No fevers, chills, night sweats, weight loss, chest pain, or shortness of breath.   Objective:    General: Well Developed, well nourished, and in no acute distress.  Neuro: Alert and oriented x3, extra-ocular muscles intact, sensation grossly intact.  HEENT: Normocephalic, atraumatic, pupils equal round reactive to light, neck supple, no masses, no lymphadenopathy, thyroid nonpalpable.  Skin: Warm and dry, no rashes. Cardiac: Regular rate and rhythm, no murmurs rubs or gallops, no lower extremity edema.  Respiratory:  Expiratory wheezes bilaterally . Not using accessory muscles, speaking in full sentences.  Impression and Recommendations:    I spent 25 minutes with this patient, greater than 50% was face-to-face time counseling regarding the above diagnoses

## 2014-12-24 NOTE — Assessment & Plan Note (Signed)
Formal physical therapy, x-rays.

## 2014-12-30 DIAGNOSIS — I2510 Atherosclerotic heart disease of native coronary artery without angina pectoris: Secondary | ICD-10-CM | POA: Diagnosis not present

## 2014-12-30 DIAGNOSIS — I252 Old myocardial infarction: Secondary | ICD-10-CM | POA: Diagnosis not present

## 2014-12-30 DIAGNOSIS — I517 Cardiomegaly: Secondary | ICD-10-CM | POA: Diagnosis not present

## 2014-12-30 DIAGNOSIS — I10 Essential (primary) hypertension: Secondary | ICD-10-CM | POA: Diagnosis not present

## 2014-12-30 DIAGNOSIS — R05 Cough: Secondary | ICD-10-CM | POA: Diagnosis not present

## 2014-12-30 DIAGNOSIS — W108XXA Fall (on) (from) other stairs and steps, initial encounter: Secondary | ICD-10-CM | POA: Diagnosis not present

## 2014-12-30 DIAGNOSIS — M1712 Unilateral primary osteoarthritis, left knee: Secondary | ICD-10-CM | POA: Diagnosis not present

## 2014-12-30 DIAGNOSIS — Z7901 Long term (current) use of anticoagulants: Secondary | ICD-10-CM | POA: Diagnosis not present

## 2014-12-30 DIAGNOSIS — M7989 Other specified soft tissue disorders: Secondary | ICD-10-CM | POA: Diagnosis not present

## 2014-12-30 DIAGNOSIS — Z7982 Long term (current) use of aspirin: Secondary | ICD-10-CM | POA: Diagnosis not present

## 2014-12-30 DIAGNOSIS — E785 Hyperlipidemia, unspecified: Secondary | ICD-10-CM | POA: Diagnosis not present

## 2014-12-30 DIAGNOSIS — I69398 Other sequelae of cerebral infarction: Secondary | ICD-10-CM | POA: Diagnosis not present

## 2014-12-30 DIAGNOSIS — S8392XA Sprain of unspecified site of left knee, initial encounter: Secondary | ICD-10-CM | POA: Diagnosis not present

## 2014-12-30 DIAGNOSIS — S0990XA Unspecified injury of head, initial encounter: Secondary | ICD-10-CM | POA: Diagnosis not present

## 2014-12-30 DIAGNOSIS — Z79899 Other long term (current) drug therapy: Secondary | ICD-10-CM | POA: Diagnosis not present

## 2014-12-30 LAB — CULTURE, BLOOD (SINGLE)
Culture: NO GROWTH
Organism ID, Bacteria: NO GROWTH

## 2015-01-01 ENCOUNTER — Ambulatory Visit (INDEPENDENT_AMBULATORY_CARE_PROVIDER_SITE_OTHER): Payer: Medicare Other | Admitting: Family Medicine

## 2015-01-01 ENCOUNTER — Encounter: Payer: Self-pay | Admitting: Family Medicine

## 2015-01-01 VITALS — BP 121/56 | HR 52

## 2015-01-01 DIAGNOSIS — S76112A Strain of left quadriceps muscle, fascia and tendon, initial encounter: Secondary | ICD-10-CM

## 2015-01-01 DIAGNOSIS — S8002XA Contusion of left knee, initial encounter: Secondary | ICD-10-CM | POA: Insufficient documentation

## 2015-01-01 DIAGNOSIS — S8392XA Sprain of unspecified site of left knee, initial encounter: Secondary | ICD-10-CM | POA: Diagnosis not present

## 2015-01-01 DIAGNOSIS — S76119A Strain of unspecified quadriceps muscle, fascia and tendon, initial encounter: Secondary | ICD-10-CM | POA: Insufficient documentation

## 2015-01-01 MED ORDER — TRAMADOL HCL 50 MG PO TABS
50.0000 mg | ORAL_TABLET | Freq: Three times a day (TID) | ORAL | Status: DC | PRN
Start: 2015-01-01 — End: 2016-07-28

## 2015-01-01 NOTE — Patient Instructions (Signed)
Thank you for coming in today. Continue the brace.  Return Tuesday or Wednesday.  Ice the knee.  Use tramadol for severe pain.   Quadriceps Tendon Tear or Disruption With Rehab The quadriceps muscles are located on the front of the thigh and are responsible for straightening the knee and bending the hip. The quadriceps tendon connects these muscles to the kneecap (patella) and also from the patella to a portion of the shin bone (tibial tubercle). A quadriceps tendon tear or disruption is characterized by a partial or complete tear of the quadriceps tendon between the quadriceps muscles and the patella. Quadriceps tendon tears or disruptions often cause pain above the knee and result in a decrease in function of the quadriceps muscles.  SYMPTOMS   A "pop" or tear felt or heard above the patella at the time of injury.  Pain, tenderness, inflammation, and/or bruising over the quadriceps tendon.  Pain that worsens with use of the quadriceps muscles.  Difficulty with common tasks that involve the quadriceps muscle, such as walking.  A crackling sound (crepitation) when the tendon is moved or touched.  Loss of fullness of the muscle or bulging within the area of muscle with complete rupture. CAUSES  A strain occurs when a force is placed on the muscle or tendon that is greater than it can withstand. Common mechanisms of injury include:  Repetitive strenuous use of the quadriceps muscles. This may be due to an increase in the intensity, frequency, or duration of exercise.  Direct trauma to the quadriceps muscles or tendons. RISK INCREASES WITH:  Activities that involve forceful contractions of the quadriceps muscles (jumping or sprinting).  Contact sports (soccer or football).  Poor strength and flexibility.  Failure to warm-up properly before activity.  Previous injury to the thigh or knee.  Untreated quadriceps tendinitis.  Corticosteroid injections into the quadriceps  tendon. PREVENTION  Warm up and stretch properly before activity.  Allow for adequate recovery between workouts.  Maintain physical fitness:  Strength, flexibility, and endurance.  Cardiovascular fitness.  Wear properly fitted and padded protective equipment. PROGNOSIS  If treated properly, then recovery from a quadriceps tendon tear or disruption usually occurs; however the recovery period may b 6 to 9 months.  RELATED COMPLICATIONS   Quadriceps muscle weakness.  Re-rupture of the tendon after treatment.  Prolonged healing time, if improperly treated or re-injured.  Risks of surgery: infection, bleeding, nerve damage, or damage to surrounding tissues. TREATMENT  Initial treatment involves rest from any activities that aggravate the symptoms. Ice, medication, and elevation may be used to help reduce pain and inflammation. The use of strengthening and stretching exercises may help reduce pain with activity. These exercises may be performed at home or with referral to a therapist. If the tear is complete, then surgery is usually required to repair the tendon, as it cannot heal on its own. After surgery immobilization is required to allow for healing. After immobilization it is important to perform strengthening and stretching exercises to help regain strength and a full range of motion.  MEDICATION   If pain medication is necessary, then nonsteroidal anti-inflammatory medications, such as aspirin and ibuprofen, or other minor pain relievers, such as acetaminophen, are often recommended.  Do not take pain medication for 7 days before surgery.  Prescription pain relievers may be given if deemed necessary by your caregiver. Use only as directed and only as much as you need. COLD THERAPY  Cold treatment (icing) relieves pain and reduces inflammation. Cold treatment should  be applied for 10 to 15 minutes every 2 to 3 hours for inflammation and pain and immediately after any activity that  aggravates your symptoms. Use ice packs or massage the area with a piece of ice (ice massage). SEEK MEDICAL CARE IF:  Treatment seems to offer no benefit, or the condition worsens.  Any medications produce adverse side effects.  Any complications from surgery occur:  Pain, numbness, or coldness in the extremity operated upon.  Discoloration of the nail beds (they become blue or gray) of the extremity operated upon.  Signs of infections (fever, pain, inflammation, redness, or persistent bleeding). EXERCISES RANGE OF MOTION (ROM) AND STRETCHING EXERCISES - Quadriceps Tendon Tear/Disruption These exercises may help you when beginning to rehabilitate your injury. Your symptoms may resolve with or without further involvement from your physician, physical therapist or athletic trainer. While completing these exercises, remember:   Restoring tissue flexibility helps normal motion to return to the joints. This allows healthier, less painful movement and activity.  An effective stretch should be held for at least 30 seconds.  A stretch should never be painful. You should only feel a gentle lengthening or release in the stretched tissue. RANGE OF MOTION - Knee Flexion and Extension, Active-Assisted  Sit on the edge of a table or chair with your thighs firmly supported. It may be helpful to place a folded towel under the end of your right / left thigh.  Flexion (bending): Place the ankle of your healthy leg on top of the other ankle. Use your healthy leg to gently bend your right / left knee until you feel a mild tension across the top of your knee.  Hold for __________ seconds.  Extension (straightening): Switch your ankles so your right / left leg is on top. Use your healthy leg to straighten your right / left knee until you feel a mild tension on the backside of your knee.  Hold for __________ seconds. Repeat __________ times. Complete __________ times per day. RANGE OF MOTION - Knee  Flexion, Active  Lie on your back with both knees straight. (If this causes back discomfort, bend your opposite knee, placing your foot flat on the floor.)  Slowly slide your heel back toward your buttocks until you feel a gentle stretch in the front of your knee or thigh.  Hold for __________ seconds. Slowly slide your heel back to the starting position. Repeat __________ times. Complete this exercise __________ times per day.  STRETCH - Knee Flexion, Supine  Lie on the floor with your right / left heel/foot lightly touching the wall (place both feet on the wall if you do not use a door frame).  Without using any effort, allow gravity to slide your foot down the wall slowly until you feel a gentle stretch in the front of your right / left knee.  Hold this stretch for __________ seconds. Then return the leg to the starting position, using your health leg for help, if needed. Repeat __________ times. Complete this stretch __________ times per day.  STRETCH - Quadriceps, Prone   Lie on your stomach on a firm surface, such as a bed or padded floor.  Bend your right / left knee and grasp your ankle. If you are unable to reach, your ankle or pant leg, use a belt around your foot to lengthen your reach.  Gently pull your heel toward your buttocks. Your knee should not slide out to the side. You should feel a stretch in the front of your  thigh and/or knee. Hold this position for __________ seconds.  Repeat __________ times. Complete this stretch __________ times per day.  STRENGTHENING EXERCISES - Quadriceps Tendon Tear/Disruption These exercises may help you when beginning to rehabilitate your injury. They may resolve your symptoms with or without further involvement from your physician, physical therapist or athletic trainer. While completing these exercises, remember:   Muscles can gain both the endurance and the strength needed for everyday activities through controlled  exercises.  Complete these exercises as instructed by your physician, physical therapist or athletic trainer. Progress the resistance and repetitions only as guided. STRENGTH - Quadriceps, Isometrics  Lie on your back with your right / left leg extended and your opposite knee bent.  Gradually tense the muscles in the front of your right / left thigh. You should see either your knee cap slide up toward your hip or increased dimpling just above the knee. This motion will push the back of the knee down toward the floor/mat/bed on which you are lying.  Hold the muscle as tight as you can without increasing your pain for __________ seconds.  Relax the muscles slowly and completely in between each repetition. Repeat __________ times. Complete this exercise __________ times per day.  STRENGTH - Quadriceps, Short Arcs   Lie on your back. Place a __________ inch towel roll under your knee so that the knee slightly bends.  Raise only your lower leg by tightening the muscles in the front of your thigh. Do not allow your thigh to rise.  Hold this position for __________ seconds. Repeat __________ times. Complete this exercise __________ times per day.  OPTIONAL ANKLE WEIGHTS: Begin with ____________________, but DO NOT exceed ____________________. Increase in1 lb/0.5 kg increments.  STRENGTH - Quadriceps, Straight Leg Raises  Quality counts! Watch for signs that the quadriceps muscle is working to insure you are strengthening the correct muscles and not "cheating" by substituting with healthier muscles.  Lay on your back with your right / left leg extended and your opposite knee bent.  Tense the muscles in the front of your right / left thigh. You should see either your knee cap slide up or increased dimpling just above the knee. Your thigh may even quiver.  Tighten these muscles even more and raise your leg 4 to 6 inches off the floor. Hold for __________ seconds.  Keeping these muscles tense,  lower your leg.  Relax the muscles slowly and completely in between each repetition. Repeat __________ times. Complete this exercise __________ times per day.  STRENGTH - Quadriceps, Step-Ups   Use a thick book, step or step stool that is __________ inches tall.  Holding a wall or counter for balance only, not support.  Slowly step-up with your right / left foot, keeping your knee in line with your hip and foot. Do not allow your knee to bend so far that you cannot see your toes.  Slowly unlock your knee and lower yourself to the starting position. Your muscles, not gravity, should lower you. Repeat __________ times. Complete this exercise __________ times per day.  STRENGTH - Quadriceps, Wall Slides  Follow guidelines for form closely. Increased knee pain often results from poorly placed feet or knees.  Lean against a smooth wall or door and walk your feet out 18-24 inches. Place your feet hip-width apart.  Slowly slide down the wall or door until your knees bend __________ degrees.* Keep your knees over your heels, not your toes, and in line with your hips, not falling  to either side.  Hold for __________ seconds. Stand up to rest for __________ seconds in between each repetition. Repeat __________ times. Complete this exercise __________ times per day. * Your physician, physical therapist or athletic trainer will alter this angle based on your symptoms and progress.   This information is not intended to replace advice given to you by your health care provider. Make sure you discuss any questions you have with your health care provider.   Document Released: 12/19/2004 Document Revised: 05/05/2014 Document Reviewed: 04/02/2008 Elsevier Interactive Patient Education [employer].

## 2015-01-01 NOTE — Assessment & Plan Note (Signed)
Possible lateral rupture. This is very difficult to tell on ultrasound today. Certainly he does not have a full thickness tear. In January for waiting with knee immobilizer. Recheck in a few days.

## 2015-01-01 NOTE — Assessment & Plan Note (Signed)
Significant contusion and ecchymosis. Plan for knee immobilizer rest and ice. Check in a few days.

## 2015-01-01 NOTE — Progress Notes (Signed)
   Subjective:    I'm seeing this patient as a consultation for:  Dr Ileene Rubens and  Shon Hough, MD [facility]   CC: Leg pain  HPI: Patient fell on December 28. He fell and hit his head and injured his left knee. He was seen in the emergency department where CT scan of the head and chest x-ray in knee x-ray were unremarkable with exception of significant contusion overlying the left knee. He was found to have a knee sprain and was placed into a knee immobilizer. In the interval he notes continued knee pain and swelling. He notes severe swelling and bruising on the anterior left knee. He's able to ambulate with a knee immobilizer. He's done limited ambulation without the knee immobilizer. He is able to stand unassisted. He takes Tylenol for pain which helps. He and his daughter denies any confusion or mental status changes.  Past medical history, Surgical history, Family history not pertinant except as noted below, Social history, Allergies, and medications have been entered into the medical record, reviewed, and no changes needed.   Review of Systems: No headache, visual changes, nausea, vomiting, diarrhea, constipation, dizziness, abdominal pain, skin rash, fevers, chills, night sweats, weight loss, swollen lymph nodes, body aches, joint swelling, muscle aches, chest pain, shortness of breath, mood changes, visual or auditory hallucinations.   Objective:    Filed Vitals:   01/01/15 1020  BP: 121/56  Pulse: 52   General: Well Developed, well nourished, and in no acute distress. Very hard of hearing Neuro/Psych: Alert and oriented x3, extra-ocular muscles intact, able to move all 4 extremities, sensation grossly intact. Skin: Warm and dry, no rashes noted.  Respiratory: Not using accessory muscles, speaking in full sentences, trachea midline.  Cardiovascular: Pulses palpable, no extremity edema. Abdomen: Does not appear distended. MSK: Left knee significant ecchymosis  and swelling on the anterior left knee. Tender to palpation throughout. Patient is able to perform a straight leg raise out of the knee immobilizer.  Limited musculoskeletal ultrasound of the left anterior knee: Significant hypoechoic fluid collection subcutaneously along the superficial knee overlying the distal quadriceps tendon and patella. This is consistent with large hematoma. Patellar tendon certainly is intact and portions. However at the lateral portion appears to be a partial rupture with disorganized appearing tissue. No significant bony changes visualized.  No results found for this or any previous visit (from the past 24 hour(s)). No results found.  Impression and Recommendations:   This case required medical decision making of moderate complexity.

## 2015-01-01 NOTE — Progress Notes (Signed)
Notes faxed to   Shon Hough, MD  [phone]

## 2015-01-05 ENCOUNTER — Encounter: Payer: Self-pay | Admitting: Family Medicine

## 2015-01-05 ENCOUNTER — Ambulatory Visit (INDEPENDENT_AMBULATORY_CARE_PROVIDER_SITE_OTHER): Payer: Medicare Other | Admitting: Family Medicine

## 2015-01-05 VITALS — BP 139/59 | HR 66 | Wt 207.0 lb

## 2015-01-05 DIAGNOSIS — S8392XA Sprain of unspecified site of left knee, initial encounter: Secondary | ICD-10-CM | POA: Diagnosis not present

## 2015-01-05 DIAGNOSIS — S76112A Strain of left quadriceps muscle, fascia and tendon, initial encounter: Secondary | ICD-10-CM

## 2015-01-05 DIAGNOSIS — S8002XA Contusion of left knee, initial encounter: Secondary | ICD-10-CM

## 2015-01-05 NOTE — Progress Notes (Signed)
Carlos Harrington is a 82 y.o. male who presents to [facility] today for Follow-up knee pain. Patient was seen last week after a fall. He was thought to have partial tear of the quadriceps tendon or muscle and contusion. In the interim he's done well. He notes continued severe pain. However he is able to ambulate without a knee immobilizer. He is using a walker. He has difficulty leaving the house. He denies any fevers or chills. He notes continued cough and wheezing.   Past Medical History  Diagnosis Date  . CAD (coronary artery disease)     with stenting X 4 in [place]  . Hypertension   . Depression   . Anxiety   . Hyperlipidemia   . Macular degeneration    Past Surgical History  Procedure Laterality Date  . Appendectomy    . Cholecystectomy    . Tonsillectomy     Social History  Substance Use Topics  . Smoking status: Never Smoker   . Smokeless tobacco: Not on file  . Alcohol Use: Yes     Comment: 2 daily   family history includes Depression in his mother; Diabetes in his mother; Hyperlipidemia in his mother; Hypertension in his mother; Other in his brother, mother, and sister.  ROS as above Medications: Current Outpatient Prescriptions  Medication Sig Dispense Refill  . albuterol (PROVENTIL HFA;VENTOLIN HFA) 108 (90 BASE) MCG/ACT inhaler Inhale 1-2 puffs into the lungs every 6 (six) hours as needed for wheezing or shortness of breath. 1 Inhaler 0  . amLODipine (NORVASC) 5 MG tablet TAKE ONE TABLET BY MOUTH EVERY DAY 90 tablet 1  . aspirin EC 81 MG tablet Take 1 tablet (81 mg total) by mouth daily. 90 tablet 2  . atorvastatin (LIPITOR) 80 MG tablet Take 1 tablet (80 mg total) by mouth daily.    . benzonatate (TESSALON) 100 MG capsule Take 1 capsule (100 mg total) by mouth every 8 (eight) hours. 21 capsule 0  . BRILINTA 90 MG TABS tablet TAKE ONE TABLET BY MOUTH 2 TIMES A DAY 180  tablet 1  . carvedilol (COREG) 6.25 MG tablet TAKE 1 TABLET BY MOUTH TWICE A DAY 60 tablet 5  . diazepam (VALIUM) 5 MG tablet Take 0.5-1 tablets (2.5-5 mg total) by mouth every 12 (twelve) hours as needed for anxiety. Only under the supervision of a non-sedated companion. 15 tablet 0  . doxazosin (CARDURA) 4 MG tablet Take 1 tablet (4 mg total) by mouth at bedtime. 90 tablet 5  . escitalopram (LEXAPRO) 20 MG tablet Take 1 tablet (20 mg total) by mouth daily. 30 tablet 5  . Fluticasone Furoate-Vilanterol (BREO ELLIPTA) 100-25 MCG/INH AEPB Inhale 1 puff into the lungs daily. 1 each 11  . furosemide (LASIX) 20 MG tablet TAKE ONE TABLET BY MOUTH 2 TIMES A DAY. (AM & NOON) 120 tablet 3  . ipratropium-albuterol (DUONEB) 0.5-2.5 (3) MG/3ML SOLN Take 3 mLs by nebulization every 4 (four) hours as needed (cough or wheezing). 360 mL 1  . polycarbophil (FIBERCON) 625 MG tablet One by mouth twice a day.    . ramipril (ALTACE) 10 MG capsule TAKE ONE CAPSULE BY MOUTH 2 TIMES A DAY 60 capsule 5  . Spacer/Aero-Holding Chambers (AEROCHAMBER PLUS WITH MASK) inhaler Use as instructed 1 each 2  . spironolactone (ALDACTONE) 25 MG tablet     . traMADol (ULTRAM) 50 MG tablet Take 1 tablet (50 mg total) by mouth every 8 (eight) hours  as needed. 15 tablet 0  . vitamin B-12 (CYANOCOBALAMIN) 1000 MCG tablet TAKE ONE TABLET BY MOUTH EVERY DAY 90 tablet 1   No current facility-administered medications for this visit.   No Known Allergies   Exam:  BP 139/59 mmHg  Pulse 66  Wt 207 lb (93.895 kg) Gen: Well NAD nontoxic Left knee significant ecchymosis and swelling overlying the anterior and medial knee Patient has intact extensor strength.He has lower extremity swelling. No palpable cords.  No results found for this or any previous visit (from the past 24 hour(s)). No results found.   Please see individual assessment and plan sections.

## 2015-01-05 NOTE — Patient Instructions (Signed)
Thank you for coming in today. Keep staying active.  We will start Home PT.  Return in 1 week.

## 2015-01-05 NOTE — Assessment & Plan Note (Signed)
Plan for home health physical therapy. Continue activity. Recheck in 1 week.

## 2015-01-06 DIAGNOSIS — H903 Sensorineural hearing loss, bilateral: Secondary | ICD-10-CM | POA: Diagnosis not present

## 2015-01-07 ENCOUNTER — Encounter: Payer: Self-pay | Admitting: Sports Medicine

## 2015-01-07 ENCOUNTER — Ambulatory Visit (INDEPENDENT_AMBULATORY_CARE_PROVIDER_SITE_OTHER): Payer: Medicare Other

## 2015-01-07 ENCOUNTER — Ambulatory Visit (INDEPENDENT_AMBULATORY_CARE_PROVIDER_SITE_OTHER): Payer: Medicare Other | Admitting: Sports Medicine

## 2015-01-07 DIAGNOSIS — J209 Acute bronchitis, unspecified: Secondary | ICD-10-CM

## 2015-01-07 DIAGNOSIS — R918 Other nonspecific abnormal finding of lung field: Secondary | ICD-10-CM

## 2015-01-07 DIAGNOSIS — R05 Cough: Secondary | ICD-10-CM | POA: Diagnosis not present

## 2015-01-07 MED ORDER — MOXIFLOXACIN HCL 400 MG PO TABS: 400.0000 mg | ORAL_TABLET | Freq: Every day | ORAL | Status: DC

## 2015-01-07 MED ORDER — TIOTROPIUM BROMIDE-OLODATEROL 2.5-2.5 MCG/ACT IN AERS: 1.0000 | INHALATION_SPRAY | Freq: Every day | RESPIRATORY_TRACT | Status: DC

## 2015-01-07 MED ORDER — BUDESONIDE-FORMOTEROL FUMARATE 160-4.5 MCG/ACT IN AERO: 1.0000 | INHALATION_SPRAY | Freq: Two times a day (BID) | RESPIRATORY_TRACT | Status: DC

## 2015-01-07 NOTE — Progress Notes (Signed)
  Subjective:    CC: Follow-up  HPI: Pneumonia: Treated now with azithromycin and doxycycline as well as oral steroids. Overall he is improving, still has a bit of cough and wheeze. Was unable to get the [facility] inhaler.  Past medical history, Surgical history, Family history not pertinant except as noted below, Social history, Allergies, and medications have been entered into the medical record, reviewed, and no changes needed.   Review of Systems: No fevers, chills, night sweats, weight loss, chest pain, or shortness of breath.   Objective:    General: Well Developed, well nourished, and in no acute distress.  Neuro: Alert and oriented x3, extra-ocular muscles intact, sensation grossly intact.  HEENT: Normocephalic, atraumatic, pupils equal round reactive to light, neck supple, no masses, no lymphadenopathy, thyroid nonpalpable.  Skin: Warm and dry, no rashes. Cardiac: Regular rate and rhythm, no murmurs rubs or gallops, no lower extremity edema.  Respiratory: Breath sounds are minimally coarse with inspiratory and expiratory wheezes diffusely. Not using accessory muscles, speaking in full sentences.  Chest x-ray shows interval resolution of the infiltrate present prior.  Impression and Recommendations:

## 2015-01-07 NOTE — Assessment & Plan Note (Signed)
Continues to improve, did not use the steroid inhaler. Giving samples of Symbicort and stiolto. Repeat chest x-ray and adding Avelox.

## 2015-01-08 DIAGNOSIS — I2510 Atherosclerotic heart disease of native coronary artery without angina pectoris: Secondary | ICD-10-CM | POA: Diagnosis not present

## 2015-01-08 DIAGNOSIS — S76112D Strain of left quadriceps muscle, fascia and tendon, subsequent encounter: Secondary | ICD-10-CM | POA: Diagnosis not present

## 2015-01-08 DIAGNOSIS — Z9181 History of falling: Secondary | ICD-10-CM | POA: Diagnosis not present

## 2015-01-08 DIAGNOSIS — Z7982 Long term (current) use of aspirin: Secondary | ICD-10-CM | POA: Diagnosis not present

## 2015-01-08 DIAGNOSIS — E785 Hyperlipidemia, unspecified: Secondary | ICD-10-CM | POA: Diagnosis not present

## 2015-01-08 DIAGNOSIS — S8002XD Contusion of left knee, subsequent encounter: Secondary | ICD-10-CM | POA: Diagnosis not present

## 2015-01-08 DIAGNOSIS — M25561 Pain in right knee: Secondary | ICD-10-CM | POA: Diagnosis not present

## 2015-01-08 DIAGNOSIS — F329 Major depressive disorder, single episode, unspecified: Secondary | ICD-10-CM | POA: Diagnosis not present

## 2015-01-08 DIAGNOSIS — F419 Anxiety disorder, unspecified: Secondary | ICD-10-CM | POA: Diagnosis not present

## 2015-01-08 DIAGNOSIS — I10 Essential (primary) hypertension: Secondary | ICD-10-CM | POA: Diagnosis not present

## 2015-01-10 LAB — CULTURE, BLOOD, SINGLE SET ONLY

## 2015-01-12 ENCOUNTER — Ambulatory Visit: Payer: Self-pay | Admitting: Family Medicine

## 2015-01-12 DIAGNOSIS — E785 Hyperlipidemia, unspecified: Secondary | ICD-10-CM | POA: Diagnosis not present

## 2015-01-12 DIAGNOSIS — I2510 Atherosclerotic heart disease of native coronary artery without angina pectoris: Secondary | ICD-10-CM | POA: Diagnosis not present

## 2015-01-12 DIAGNOSIS — M25561 Pain in right knee: Secondary | ICD-10-CM | POA: Diagnosis not present

## 2015-01-12 DIAGNOSIS — S8002XD Contusion of left knee, subsequent encounter: Secondary | ICD-10-CM | POA: Diagnosis not present

## 2015-01-12 DIAGNOSIS — I10 Essential (primary) hypertension: Secondary | ICD-10-CM | POA: Diagnosis not present

## 2015-01-12 DIAGNOSIS — S76112D Strain of left quadriceps muscle, fascia and tendon, subsequent encounter: Secondary | ICD-10-CM | POA: Diagnosis not present

## 2015-01-13 ENCOUNTER — Ambulatory Visit (INDEPENDENT_AMBULATORY_CARE_PROVIDER_SITE_OTHER): Payer: Medicare Other | Admitting: Family Medicine

## 2015-01-13 ENCOUNTER — Encounter: Payer: Self-pay | Admitting: Family Medicine

## 2015-01-13 VITALS — BP 114/59 | HR 59

## 2015-01-13 DIAGNOSIS — M25569 Pain in unspecified knee: Secondary | ICD-10-CM | POA: Diagnosis not present

## 2015-01-13 DIAGNOSIS — R531 Weakness: Secondary | ICD-10-CM | POA: Diagnosis not present

## 2015-01-13 DIAGNOSIS — E785 Hyperlipidemia, unspecified: Secondary | ICD-10-CM | POA: Diagnosis not present

## 2015-01-13 DIAGNOSIS — Z6832 Body mass index (BMI) 32.0-32.9, adult: Secondary | ICD-10-CM | POA: Diagnosis not present

## 2015-01-13 DIAGNOSIS — R410 Disorientation, unspecified: Secondary | ICD-10-CM | POA: Diagnosis not present

## 2015-01-13 DIAGNOSIS — N400 Enlarged prostate without lower urinary tract symptoms: Secondary | ICD-10-CM | POA: Diagnosis not present

## 2015-01-13 DIAGNOSIS — R55 Syncope and collapse: Secondary | ICD-10-CM | POA: Diagnosis not present

## 2015-01-13 DIAGNOSIS — S300XXA Contusion of lower back and pelvis, initial encounter: Secondary | ICD-10-CM | POA: Diagnosis not present

## 2015-01-13 DIAGNOSIS — R590 Localized enlarged lymph nodes: Secondary | ICD-10-CM | POA: Diagnosis not present

## 2015-01-13 DIAGNOSIS — Z79899 Other long term (current) drug therapy: Secondary | ICD-10-CM | POA: Diagnosis not present

## 2015-01-13 DIAGNOSIS — Z955 Presence of coronary angioplasty implant and graft: Secondary | ICD-10-CM | POA: Diagnosis not present

## 2015-01-13 DIAGNOSIS — G459 Transient cerebral ischemic attack, unspecified: Secondary | ICD-10-CM | POA: Diagnosis not present

## 2015-01-13 DIAGNOSIS — G4733 Obstructive sleep apnea (adult) (pediatric): Secondary | ICD-10-CM | POA: Diagnosis not present

## 2015-01-13 DIAGNOSIS — I255 Ischemic cardiomyopathy: Secondary | ICD-10-CM | POA: Diagnosis not present

## 2015-01-13 DIAGNOSIS — I959 Hypotension, unspecified: Secondary | ICD-10-CM | POA: Diagnosis not present

## 2015-01-13 DIAGNOSIS — W19XXXA Unspecified fall, initial encounter: Secondary | ICD-10-CM | POA: Diagnosis not present

## 2015-01-13 DIAGNOSIS — I2510 Atherosclerotic heart disease of native coronary artery without angina pectoris: Secondary | ICD-10-CM | POA: Diagnosis not present

## 2015-01-13 DIAGNOSIS — R404 Transient alteration of awareness: Secondary | ICD-10-CM | POA: Diagnosis not present

## 2015-01-13 DIAGNOSIS — M47816 Spondylosis without myelopathy or radiculopathy, lumbar region: Secondary | ICD-10-CM | POA: Diagnosis not present

## 2015-01-13 DIAGNOSIS — E669 Obesity, unspecified: Secondary | ICD-10-CM | POA: Diagnosis not present

## 2015-01-13 DIAGNOSIS — I252 Old myocardial infarction: Secondary | ICD-10-CM | POA: Diagnosis not present

## 2015-01-13 DIAGNOSIS — I129 Hypertensive chronic kidney disease with stage 1 through stage 4 chronic kidney disease, or unspecified chronic kidney disease: Secondary | ICD-10-CM | POA: Diagnosis not present

## 2015-01-13 DIAGNOSIS — Z8673 Personal history of transient ischemic attack (TIA), and cerebral infarction without residual deficits: Secondary | ICD-10-CM | POA: Diagnosis not present

## 2015-01-13 DIAGNOSIS — N183 Chronic kidney disease, stage 3 (moderate): Secondary | ICD-10-CM | POA: Diagnosis not present

## 2015-01-13 DIAGNOSIS — I82409 Acute embolism and thrombosis of unspecified deep veins of unspecified lower extremity: Secondary | ICD-10-CM | POA: Diagnosis not present

## 2015-01-13 DIAGNOSIS — R079 Chest pain, unspecified: Secondary | ICD-10-CM | POA: Diagnosis not present

## 2015-01-13 DIAGNOSIS — M549 Dorsalgia, unspecified: Secondary | ICD-10-CM | POA: Diagnosis not present

## 2015-01-13 DIAGNOSIS — I517 Cardiomegaly: Secondary | ICD-10-CM | POA: Diagnosis not present

## 2015-01-13 DIAGNOSIS — M5136 Other intervertebral disc degeneration, lumbar region: Secondary | ICD-10-CM | POA: Diagnosis not present

## 2015-01-13 DIAGNOSIS — Z7982 Long term (current) use of aspirin: Secondary | ICD-10-CM | POA: Diagnosis not present

## 2015-01-13 NOTE — Progress Notes (Signed)
Carlos Harrington is a 82 y.o. male who presents to [facility]: Primary Care today for follow up knee pain.  Patient was here today to discuss his recent quad tendon injury. However about 50 minutes prior to presentation he fell at a PT office. He did not hit his head per his daughter. However upon arrival to our office he had a change in mental status. He was sitting in his walker and not responding to questions appropriately. His daughter thinks perhaps he's had a stroke or has had a significant change in his mental status. This all happened the last several minutes. He denies any chest pain and his daughter states that he has not complained of any chest pain. No new medications per daughter.   Past Medical History  Diagnosis Date  . CAD (coronary artery disease)     with stenting X 4 in [place]  . Hypertension   . Depression   . Anxiety   . Hyperlipidemia   . Macular degeneration    Past Surgical History  Procedure Laterality Date  . Appendectomy    . Cholecystectomy    . Tonsillectomy     Social History  Substance Use Topics  . Smoking status: Never Smoker   . Smokeless tobacco: Not on file  . Alcohol Use: Yes     Comment: 2 daily   family history includes Depression in his mother; Diabetes in his mother; Hyperlipidemia in his mother; Hypertension in his mother; Other in his brother, mother, and sister.  ROS as above Medications: Current Outpatient Prescriptions  Medication Sig Dispense Refill  . albuterol (PROVENTIL HFA;VENTOLIN HFA) 108 (90 BASE) MCG/ACT inhaler Inhale 1-2 puffs into the lungs every 6 (six) hours as needed for wheezing or shortness of breath. 1 Inhaler 0  . amLODipine (NORVASC) 5 MG tablet TAKE ONE TABLET BY MOUTH EVERY DAY 90 tablet 1  . aspirin EC 81 MG tablet Take 1 tablet (81 mg total) by mouth daily. 90 tablet 2  . atorvastatin (LIPITOR) 80 MG tablet Take 1 tablet (80 mg  total) by mouth daily.    [name] BRILINTA 90 MG TABS tablet TAKE ONE TABLET BY MOUTH 2 TIMES A DAY 180 tablet 1  . budesonide-formoterol (SYMBICORT) 160-4.5 MCG/ACT inhaler Inhale 1 puff into the lungs 2 (two) times daily. 1 Inhaler 3  . carvedilol (COREG) 6.25 MG tablet TAKE 1 TABLET BY MOUTH TWICE A DAY 60 tablet 5  . diazepam (VALIUM) 5 MG tablet Take 0.5-1 tablets (2.5-5 mg total) by mouth every 12 (twelve) hours as needed for anxiety. Only under the supervision of a non-sedated companion. 15 tablet 0  . doxazosin (CARDURA) 4 MG tablet Take 1 tablet (4 mg total) by mouth at bedtime. 90 tablet 5  . escitalopram (LEXAPRO) 20 MG tablet Take 1 tablet (20 mg total) by mouth daily. 30 tablet 5  . furosemide (LASIX) 20 MG tablet TAKE ONE TABLET BY MOUTH 2 TIMES A DAY. (AM & NOON) 120 tablet 3  . ipratropium-albuterol (DUONEB) 0.5-2.5 (3) MG/3ML SOLN Take 3 mLs by nebulization every 4 (four) hours as needed (cough or wheezing). 360 mL 1  . moxifloxacin (AVELOX) 400 MG tablet Take 1 tablet (400 mg total) by mouth daily. 10 tablet 0  . polycarbophil (FIBERCON) 625 MG tablet One by mouth twice a day.    . ramipril (ALTACE) 10 MG capsule TAKE ONE CAPSULE BY MOUTH 2 TIMES A DAY 60 capsule 5  . Spacer/Aero-Holding Chambers (AEROCHAMBER  PLUS WITH MASK) inhaler Use as instructed 1 each 2  . spironolactone (ALDACTONE) 25 MG tablet     . Tiotropium Bromide-Olodaterol (STIOLTO RESPIMAT) 2.5-2.5 MCG/ACT AERS Inhale 1 puff into the lungs daily. 1 Inhaler 0  . traMADol (ULTRAM) 50 MG tablet Take 1 tablet (50 mg total) by mouth every 8 (eight) hours as needed. 15 tablet 0  . vitamin B-12 (CYANOCOBALAMIN) 1000 MCG tablet TAKE ONE TABLET BY MOUTH EVERY DAY 90 tablet 1   No current facility-administered medications for this visit.   No Known Allergies   Exam:   Filed Vitals:   01/13/15 1148 01/13/15 1207  BP: 76/33 114/59  Pulse: 75 59    Repeat VS: 114/59 hr 59 sat 93% on room air Gen: Ill appearing HEENT:  EOMI,  MMM Lungs: Normal work of breathing. CTABL Heart: Rate around 50 faint heart sounds no MRG Abd: NABS, Soft. Nondistended, Nontender Exts: Brisk capillary refill, warm and well perfused.  Neuro: Not alert or oriented initially. Left pronator drift present. When lying patient responds to commands appropriately.   BS 175  20 gauge IV placed in the right dorsal hand.    No results found for this or any previous visit (from the past 24 hour(s)). No results found.   82 yo male with change in mental status. Unclear etiology at this time. I suspect patient has hypotension from an unknown source. There may be a cranial injury rather may be an acute CVA. EMS has responded and patient will be transferred to the emergency department ASAP for further evaluation and management.

## 2015-01-14 DIAGNOSIS — I340 Nonrheumatic mitral (valve) insufficiency: Secondary | ICD-10-CM | POA: Diagnosis not present

## 2015-01-14 DIAGNOSIS — I959 Hypotension, unspecified: Secondary | ICD-10-CM | POA: Diagnosis not present

## 2015-01-14 DIAGNOSIS — I255 Ischemic cardiomyopathy: Secondary | ICD-10-CM | POA: Diagnosis not present

## 2015-01-14 DIAGNOSIS — I129 Hypertensive chronic kidney disease with stage 1 through stage 4 chronic kidney disease, or unspecified chronic kidney disease: Secondary | ICD-10-CM | POA: Diagnosis not present

## 2015-01-14 DIAGNOSIS — G459 Transient cerebral ischemic attack, unspecified: Secondary | ICD-10-CM | POA: Diagnosis not present

## 2015-01-14 DIAGNOSIS — I519 Heart disease, unspecified: Secondary | ICD-10-CM | POA: Diagnosis not present

## 2015-01-14 DIAGNOSIS — I517 Cardiomegaly: Secondary | ICD-10-CM | POA: Diagnosis not present

## 2015-01-14 DIAGNOSIS — I252 Old myocardial infarction: Secondary | ICD-10-CM | POA: Diagnosis not present

## 2015-01-14 DIAGNOSIS — I2510 Atherosclerotic heart disease of native coronary artery without angina pectoris: Secondary | ICD-10-CM | POA: Diagnosis not present

## 2015-01-14 DIAGNOSIS — I700 Atherosclerosis of aorta: Secondary | ICD-10-CM | POA: Diagnosis not present

## 2015-01-14 DIAGNOSIS — N183 Chronic kidney disease, stage 3 (moderate): Secondary | ICD-10-CM | POA: Diagnosis not present

## 2015-01-16 DIAGNOSIS — S76112D Strain of left quadriceps muscle, fascia and tendon, subsequent encounter: Secondary | ICD-10-CM | POA: Diagnosis not present

## 2015-01-16 DIAGNOSIS — I10 Essential (primary) hypertension: Secondary | ICD-10-CM | POA: Diagnosis not present

## 2015-01-16 DIAGNOSIS — M25561 Pain in right knee: Secondary | ICD-10-CM | POA: Diagnosis not present

## 2015-01-16 DIAGNOSIS — I2510 Atherosclerotic heart disease of native coronary artery without angina pectoris: Secondary | ICD-10-CM | POA: Diagnosis not present

## 2015-01-16 DIAGNOSIS — S8002XD Contusion of left knee, subsequent encounter: Secondary | ICD-10-CM | POA: Diagnosis not present

## 2015-01-16 DIAGNOSIS — E785 Hyperlipidemia, unspecified: Secondary | ICD-10-CM | POA: Diagnosis not present

## 2015-01-19 DIAGNOSIS — M25561 Pain in right knee: Secondary | ICD-10-CM | POA: Diagnosis not present

## 2015-01-19 DIAGNOSIS — I2510 Atherosclerotic heart disease of native coronary artery without angina pectoris: Secondary | ICD-10-CM | POA: Diagnosis not present

## 2015-01-19 DIAGNOSIS — S76112D Strain of left quadriceps muscle, fascia and tendon, subsequent encounter: Secondary | ICD-10-CM | POA: Diagnosis not present

## 2015-01-19 DIAGNOSIS — I10 Essential (primary) hypertension: Secondary | ICD-10-CM | POA: Diagnosis not present

## 2015-01-19 DIAGNOSIS — E785 Hyperlipidemia, unspecified: Secondary | ICD-10-CM | POA: Diagnosis not present

## 2015-01-19 DIAGNOSIS — S8002XD Contusion of left knee, subsequent encounter: Secondary | ICD-10-CM | POA: Diagnosis not present

## 2015-01-21 DIAGNOSIS — I10 Essential (primary) hypertension: Secondary | ICD-10-CM | POA: Diagnosis not present

## 2015-01-21 DIAGNOSIS — E785 Hyperlipidemia, unspecified: Secondary | ICD-10-CM | POA: Diagnosis not present

## 2015-01-21 DIAGNOSIS — I2510 Atherosclerotic heart disease of native coronary artery without angina pectoris: Secondary | ICD-10-CM | POA: Diagnosis not present

## 2015-01-21 DIAGNOSIS — S8002XD Contusion of left knee, subsequent encounter: Secondary | ICD-10-CM | POA: Diagnosis not present

## 2015-01-21 DIAGNOSIS — S76112D Strain of left quadriceps muscle, fascia and tendon, subsequent encounter: Secondary | ICD-10-CM | POA: Diagnosis not present

## 2015-01-21 DIAGNOSIS — M25561 Pain in right knee: Secondary | ICD-10-CM | POA: Diagnosis not present

## 2015-01-26 DIAGNOSIS — S76112D Strain of left quadriceps muscle, fascia and tendon, subsequent encounter: Secondary | ICD-10-CM | POA: Diagnosis not present

## 2015-01-26 DIAGNOSIS — I10 Essential (primary) hypertension: Secondary | ICD-10-CM | POA: Diagnosis not present

## 2015-01-26 DIAGNOSIS — E785 Hyperlipidemia, unspecified: Secondary | ICD-10-CM | POA: Diagnosis not present

## 2015-01-26 DIAGNOSIS — I2510 Atherosclerotic heart disease of native coronary artery without angina pectoris: Secondary | ICD-10-CM | POA: Diagnosis not present

## 2015-01-26 DIAGNOSIS — S8002XD Contusion of left knee, subsequent encounter: Secondary | ICD-10-CM | POA: Diagnosis not present

## 2015-01-26 DIAGNOSIS — M25561 Pain in right knee: Secondary | ICD-10-CM | POA: Diagnosis not present

## 2015-01-27 DIAGNOSIS — H348312 Tributary (branch) retinal vein occlusion, right eye, stable: Secondary | ICD-10-CM | POA: Diagnosis not present

## 2015-01-27 DIAGNOSIS — H527 Unspecified disorder of refraction: Secondary | ICD-10-CM | POA: Diagnosis not present

## 2015-01-27 DIAGNOSIS — Z961 Presence of intraocular lens: Secondary | ICD-10-CM | POA: Diagnosis not present

## 2015-01-27 DIAGNOSIS — H40031 Anatomical narrow angle, right eye: Secondary | ICD-10-CM | POA: Diagnosis not present

## 2015-01-27 DIAGNOSIS — H35373 Puckering of macula, bilateral: Secondary | ICD-10-CM | POA: Diagnosis not present

## 2015-01-27 DIAGNOSIS — H25811 Combined forms of age-related cataract, right eye: Secondary | ICD-10-CM | POA: Diagnosis not present

## 2015-01-27 DIAGNOSIS — H52223 Regular astigmatism, bilateral: Secondary | ICD-10-CM | POA: Diagnosis not present

## 2015-01-27 DIAGNOSIS — H353132 Nonexudative age-related macular degeneration, bilateral, intermediate dry stage: Secondary | ICD-10-CM | POA: Diagnosis not present

## 2015-01-28 DIAGNOSIS — I10 Essential (primary) hypertension: Secondary | ICD-10-CM | POA: Diagnosis not present

## 2015-01-28 DIAGNOSIS — E785 Hyperlipidemia, unspecified: Secondary | ICD-10-CM | POA: Diagnosis not present

## 2015-01-28 DIAGNOSIS — S76112D Strain of left quadriceps muscle, fascia and tendon, subsequent encounter: Secondary | ICD-10-CM | POA: Diagnosis not present

## 2015-01-28 DIAGNOSIS — M25561 Pain in right knee: Secondary | ICD-10-CM | POA: Diagnosis not present

## 2015-01-28 DIAGNOSIS — S8002XD Contusion of left knee, subsequent encounter: Secondary | ICD-10-CM | POA: Diagnosis not present

## 2015-01-28 DIAGNOSIS — I2510 Atherosclerotic heart disease of native coronary artery without angina pectoris: Secondary | ICD-10-CM | POA: Diagnosis not present

## 2015-02-06 DIAGNOSIS — I10 Essential (primary) hypertension: Secondary | ICD-10-CM | POA: Diagnosis not present

## 2015-02-06 DIAGNOSIS — I2510 Atherosclerotic heart disease of native coronary artery without angina pectoris: Secondary | ICD-10-CM | POA: Diagnosis not present

## 2015-02-06 DIAGNOSIS — S8002XD Contusion of left knee, subsequent encounter: Secondary | ICD-10-CM | POA: Diagnosis not present

## 2015-02-06 DIAGNOSIS — M25561 Pain in right knee: Secondary | ICD-10-CM | POA: Diagnosis not present

## 2015-02-06 DIAGNOSIS — S76112D Strain of left quadriceps muscle, fascia and tendon, subsequent encounter: Secondary | ICD-10-CM | POA: Diagnosis not present

## 2015-02-06 DIAGNOSIS — E785 Hyperlipidemia, unspecified: Secondary | ICD-10-CM | POA: Diagnosis not present

## 2015-02-15 ENCOUNTER — Ambulatory Visit (INDEPENDENT_AMBULATORY_CARE_PROVIDER_SITE_OTHER): Payer: Medicare Other | Admitting: Family Medicine

## 2015-02-15 ENCOUNTER — Encounter: Payer: Self-pay | Admitting: Family Medicine

## 2015-02-15 VITALS — BP 132/60 | HR 60 | Wt 213.0 lb

## 2015-02-15 DIAGNOSIS — I10 Essential (primary) hypertension: Secondary | ICD-10-CM | POA: Diagnosis not present

## 2015-02-15 DIAGNOSIS — J441 Chronic obstructive pulmonary disease with (acute) exacerbation: Secondary | ICD-10-CM

## 2015-02-15 MED ORDER — PREDNISONE 20 MG PO TABS: ORAL_TABLET | ORAL | Status: AC

## 2015-02-15 MED ORDER — AZITHROMYCIN 250 MG PO TABS: ORAL_TABLET | ORAL | Status: AC

## 2015-02-15 NOTE — Progress Notes (Signed)
CC: Carlos Harrington is a 82 y.o. male is here for Hypertension and URI   Subjective: HPI:   Follow-up essential hypertension: His cardiologist stopped his ramipril. He believes that one of the other blood pressure medication. As well however he cannot remember the name of it right now. He denies any chest pain or peripheral edema. Denies any limb claudication or any motor or sensory disturbances. No outside blood pressures report.  Over the last 4 days he's had increased sputum production, increased cough and wheezing. Symptoms are greatly improved if he uses his DuoNeb however only for a few minutes to hours. He does not believe that he is using his Symbicort. He denies any fevers, chills or shortness of breath. He denies any facial pressure nor nasal congestion.denies any confusion. Symptoms are moderate in severity and worsening on daily basis.   Review Of Systems Outlined In HPI  Past Medical History  Diagnosis Date  . CAD (coronary artery disease)     with stenting X 4 in [place]  . Hypertension   . Depression   . Anxiety   . Hyperlipidemia   . Macular degeneration     Past Surgical History  Procedure Laterality Date  . Appendectomy    . Cholecystectomy    . Tonsillectomy     Family History  Problem Relation Age of Onset  . Hypertension Mother   . Hyperlipidemia Mother   . Diabetes Mother   . Other Mother     CVA  . Depression Mother   . Other Sister     brain tumor  . Other Brother     CVA    Social History   Social History  . Marital Status: Married    Spouse Name: N/A  . Number of Children: N/A  . Years of Education: N/A   Occupational History  . Not on file.   Social History Main Topics  . Smoking status: Never Smoker   . Smokeless tobacco: Not on file  . Alcohol Use: Yes     Comment: 2 daily  . Drug Use: No  . Sexual Activity: Not Currently   Other Topics Concern  . Not on file   Social History Narrative     Objective: BP 132/60 mmHg  Pulse 60  Wt  213 lb (96.616 kg)  SpO2 92%  General: Alert and Oriented, No Acute Distress HEENT: Pupils equal, round, reactive to light. Conjunctivae clear.  External ears unremarkable, canals clear with intact TMs with appropriate landmarks.  Middle ear appears open without effusion. Pink inferior turbinates.  Moist mucous membranes, pharynx without inflammation nor lesions.  Neck supple without palpable lymphadenopathy nor abnormal masses. Lungs: comfortablework of breathing with trace rhonchi and wheezing in all posterior lung fields. No rales or signs of consolidation Cardiac: Regular rate and rhythm. Normal S1/S2.  No murmurs, rubs, nor gallops.   Extremities: No peripheral edema.  Strong peripheral pulses.  Mental Status: No depression, anxiety, nor agitation. Skin: Warm and dry.  Assessment & Plan: Carlos Harrington was seen today for hypertension and uri.  Diagnoses and all orders for this visit:  Essential hypertension, benign  COPD exacerbation (HCC) -     azithromycin (ZITHROMAX) 250 MG tablet; Take two tabs at once on day 1, then one tab daily on days 2-5. -     predniSONE (DELTASONE) 20 MG tablet; Three tabs at once daily for five days.   Essential hypertension: Controlled continue all antihypertensives that is currently taking, vascular to call me later today  when he gets home and determines which blood pressure medication was stopped since I saw him last. COPD exacerbations: Start prednisone burst and azithromycin, urged to start taking Symbicort twice a day.  Return in about 3 months (around 05/15/2015).

## 2015-03-23 DIAGNOSIS — H903 Sensorineural hearing loss, bilateral: Secondary | ICD-10-CM | POA: Diagnosis not present

## 2015-04-22 DIAGNOSIS — Z961 Presence of intraocular lens: Secondary | ICD-10-CM | POA: Diagnosis not present

## 2015-04-22 DIAGNOSIS — H2511 Age-related nuclear cataract, right eye: Secondary | ICD-10-CM | POA: Diagnosis not present

## 2015-05-18 ENCOUNTER — Telehealth: Payer: Self-pay

## 2015-05-18 NOTE — Telephone Encounter (Signed)
Pt is having some dental work done and wants to know should he stop Brilinta before? please advise.

## 2015-05-18 NOTE — Telephone Encounter (Signed)
I would recommend stopping this starting five days before the procedure and starting it after the procedure on the day of the procedure as long as he's not bleeding.

## 2015-05-18 NOTE — Telephone Encounter (Signed)
Daughter notified 

## 2015-05-24 ENCOUNTER — Other Ambulatory Visit: Payer: Self-pay | Admitting: Family Medicine

## 2015-05-27 ENCOUNTER — Other Ambulatory Visit: Payer: Self-pay | Admitting: Family Medicine

## 2015-06-03 DIAGNOSIS — I5032 Chronic diastolic (congestive) heart failure: Secondary | ICD-10-CM | POA: Diagnosis not present

## 2015-06-03 DIAGNOSIS — I2510 Atherosclerotic heart disease of native coronary artery without angina pectoris: Secondary | ICD-10-CM | POA: Diagnosis not present

## 2015-06-03 DIAGNOSIS — I252 Old myocardial infarction: Secondary | ICD-10-CM | POA: Diagnosis not present

## 2015-06-03 DIAGNOSIS — E7800 Pure hypercholesterolemia, unspecified: Secondary | ICD-10-CM | POA: Diagnosis not present

## 2015-06-03 DIAGNOSIS — I10 Essential (primary) hypertension: Secondary | ICD-10-CM | POA: Diagnosis not present

## 2015-06-03 DIAGNOSIS — N183 Chronic kidney disease, stage 3 (moderate): Secondary | ICD-10-CM | POA: Diagnosis not present

## 2015-06-03 DIAGNOSIS — Z955 Presence of coronary angioplasty implant and graft: Secondary | ICD-10-CM | POA: Diagnosis not present

## 2015-06-18 ENCOUNTER — Other Ambulatory Visit: Payer: Self-pay | Admitting: Family Medicine

## 2015-06-18 DIAGNOSIS — H52221 Regular astigmatism, right eye: Secondary | ICD-10-CM | POA: Diagnosis not present

## 2015-06-18 DIAGNOSIS — H25811 Combined forms of age-related cataract, right eye: Secondary | ICD-10-CM | POA: Diagnosis not present

## 2015-06-22 DIAGNOSIS — Z955 Presence of coronary angioplasty implant and graft: Secondary | ICD-10-CM | POA: Diagnosis not present

## 2015-06-22 DIAGNOSIS — N183 Chronic kidney disease, stage 3 (moderate): Secondary | ICD-10-CM | POA: Diagnosis not present

## 2015-06-22 DIAGNOSIS — I5032 Chronic diastolic (congestive) heart failure: Secondary | ICD-10-CM | POA: Diagnosis not present

## 2015-06-22 DIAGNOSIS — I10 Essential (primary) hypertension: Secondary | ICD-10-CM | POA: Diagnosis not present

## 2015-06-22 DIAGNOSIS — I252 Old myocardial infarction: Secondary | ICD-10-CM | POA: Diagnosis not present

## 2015-06-22 DIAGNOSIS — I2510 Atherosclerotic heart disease of native coronary artery without angina pectoris: Secondary | ICD-10-CM | POA: Diagnosis not present

## 2015-07-06 ENCOUNTER — Other Ambulatory Visit: Payer: Self-pay | Admitting: Family Medicine

## 2015-07-08 DIAGNOSIS — H40031 Anatomical narrow angle, right eye: Secondary | ICD-10-CM | POA: Diagnosis not present

## 2015-07-08 DIAGNOSIS — Z79899 Other long term (current) drug therapy: Secondary | ICD-10-CM | POA: Diagnosis not present

## 2015-07-08 DIAGNOSIS — M1990 Unspecified osteoarthritis, unspecified site: Secondary | ICD-10-CM | POA: Diagnosis not present

## 2015-07-08 DIAGNOSIS — Z8249 Family history of ischemic heart disease and other diseases of the circulatory system: Secondary | ICD-10-CM | POA: Diagnosis not present

## 2015-07-08 DIAGNOSIS — E785 Hyperlipidemia, unspecified: Secondary | ICD-10-CM | POA: Diagnosis not present

## 2015-07-08 DIAGNOSIS — Z6834 Body mass index (BMI) 34.0-34.9, adult: Secondary | ICD-10-CM | POA: Diagnosis not present

## 2015-07-08 DIAGNOSIS — I2510 Atherosclerotic heart disease of native coronary artery without angina pectoris: Secondary | ICD-10-CM | POA: Diagnosis not present

## 2015-07-08 DIAGNOSIS — H52221 Regular astigmatism, right eye: Secondary | ICD-10-CM | POA: Diagnosis not present

## 2015-07-08 DIAGNOSIS — J449 Chronic obstructive pulmonary disease, unspecified: Secondary | ICD-10-CM | POA: Diagnosis not present

## 2015-07-08 DIAGNOSIS — H2181 Floppy iris syndrome: Secondary | ICD-10-CM | POA: Diagnosis not present

## 2015-07-08 DIAGNOSIS — I130 Hypertensive heart and chronic kidney disease with heart failure and stage 1 through stage 4 chronic kidney disease, or unspecified chronic kidney disease: Secondary | ICD-10-CM | POA: Diagnosis not present

## 2015-07-08 DIAGNOSIS — H35373 Puckering of macula, bilateral: Secondary | ICD-10-CM | POA: Diagnosis not present

## 2015-07-08 DIAGNOSIS — H25811 Combined forms of age-related cataract, right eye: Secondary | ICD-10-CM | POA: Diagnosis not present

## 2015-07-08 DIAGNOSIS — Z955 Presence of coronary angioplasty implant and graft: Secondary | ICD-10-CM | POA: Diagnosis not present

## 2015-07-08 DIAGNOSIS — I509 Heart failure, unspecified: Secondary | ICD-10-CM | POA: Diagnosis not present

## 2015-07-08 DIAGNOSIS — Z961 Presence of intraocular lens: Secondary | ICD-10-CM | POA: Diagnosis not present

## 2015-07-08 DIAGNOSIS — I252 Old myocardial infarction: Secondary | ICD-10-CM | POA: Diagnosis not present

## 2015-07-08 DIAGNOSIS — E669 Obesity, unspecified: Secondary | ICD-10-CM | POA: Diagnosis not present

## 2015-07-08 DIAGNOSIS — H348312 Tributary (branch) retinal vein occlusion, right eye, stable: Secondary | ICD-10-CM | POA: Diagnosis not present

## 2015-07-08 DIAGNOSIS — Z833 Family history of diabetes mellitus: Secondary | ICD-10-CM | POA: Diagnosis not present

## 2015-07-08 DIAGNOSIS — G4730 Sleep apnea, unspecified: Secondary | ICD-10-CM | POA: Diagnosis not present

## 2015-07-08 DIAGNOSIS — H353132 Nonexudative age-related macular degeneration, bilateral, intermediate dry stage: Secondary | ICD-10-CM | POA: Diagnosis not present

## 2015-07-08 DIAGNOSIS — Z7982 Long term (current) use of aspirin: Secondary | ICD-10-CM | POA: Diagnosis not present

## 2015-07-08 DIAGNOSIS — Z8673 Personal history of transient ischemic attack (TIA), and cerebral infarction without residual deficits: Secondary | ICD-10-CM | POA: Diagnosis not present

## 2015-07-08 DIAGNOSIS — N183 Chronic kidney disease, stage 3 (moderate): Secondary | ICD-10-CM | POA: Diagnosis not present

## 2015-07-13 ENCOUNTER — Ambulatory Visit: Payer: Medicare Other | Admitting: Family Medicine

## 2015-07-13 DIAGNOSIS — I252 Old myocardial infarction: Secondary | ICD-10-CM | POA: Diagnosis not present

## 2015-07-13 DIAGNOSIS — N183 Chronic kidney disease, stage 3 (moderate): Secondary | ICD-10-CM | POA: Diagnosis not present

## 2015-07-13 DIAGNOSIS — I5032 Chronic diastolic (congestive) heart failure: Secondary | ICD-10-CM | POA: Diagnosis not present

## 2015-07-13 DIAGNOSIS — I2510 Atherosclerotic heart disease of native coronary artery without angina pectoris: Secondary | ICD-10-CM | POA: Diagnosis not present

## 2015-07-13 DIAGNOSIS — Z955 Presence of coronary angioplasty implant and graft: Secondary | ICD-10-CM | POA: Diagnosis not present

## 2015-07-16 DIAGNOSIS — H838X3 Other specified diseases of inner ear, bilateral: Secondary | ICD-10-CM | POA: Diagnosis not present

## 2015-07-16 DIAGNOSIS — H903 Sensorineural hearing loss, bilateral: Secondary | ICD-10-CM | POA: Diagnosis not present

## 2015-07-21 ENCOUNTER — Encounter: Payer: Self-pay | Admitting: Family Medicine

## 2015-07-21 ENCOUNTER — Ambulatory Visit (INDEPENDENT_AMBULATORY_CARE_PROVIDER_SITE_OTHER): Payer: Medicare Other | Admitting: Family Medicine

## 2015-07-21 VITALS — BP 151/74 | HR 59 | Wt 216.0 lb

## 2015-07-21 DIAGNOSIS — I2510 Atherosclerotic heart disease of native coronary artery without angina pectoris: Secondary | ICD-10-CM | POA: Diagnosis not present

## 2015-07-21 DIAGNOSIS — L309 Dermatitis, unspecified: Secondary | ICD-10-CM

## 2015-07-21 DIAGNOSIS — I10 Essential (primary) hypertension: Secondary | ICD-10-CM | POA: Diagnosis not present

## 2015-07-21 DIAGNOSIS — E538 Deficiency of other specified B group vitamins: Secondary | ICD-10-CM

## 2015-07-21 DIAGNOSIS — R7303 Prediabetes: Secondary | ICD-10-CM

## 2015-07-21 MED ORDER — DOXAZOSIN MESYLATE 4 MG PO TABS: 4.0000 mg | ORAL_TABLET | Freq: Every day | ORAL | Status: DC

## 2015-07-21 MED ORDER — FUROSEMIDE 20 MG PO TABS: 20.0000 mg | ORAL_TABLET | Freq: Two times a day (BID) | ORAL | Status: DC

## 2015-07-21 MED ORDER — TRIAMCINOLONE ACETONIDE 0.1 % EX CREA
TOPICAL_CREAM | CUTANEOUS | Status: DC
Start: 2015-07-21 — End: 2015-10-06

## 2015-07-21 MED ORDER — TICAGRELOR 90 MG PO TABS: 90.0000 mg | ORAL_TABLET | Freq: Two times a day (BID) | ORAL | Status: DC

## 2015-07-21 MED ORDER — VITAMIN B-12 1000 MCG PO TABS: 1000.0000 ug | ORAL_TABLET | Freq: Every day | ORAL | Status: DC

## 2015-07-21 NOTE — Progress Notes (Signed)
CC: Carlos Harrington is a 82 y.o. male is here for Hypertension and Medication Refill   Subjective: HPI:  Follow essential hypertension: He is taking doxazosin amlodipine as prolactin and carvedilol on a daily basis when he has this medication available. He needs refills on Cardura because he ran of his medication. No outside blood pressures to report. Denies chest pain shortness of breath orthopnea nor peripheral edema.  He's requesting a refill onBrilinta. There has been no new bruising or bleeding abnormalities.  Requesting refill on vitamin B12, he's not sure of last tetanus was checked. He denies any new motor or sensory disturbances  Follow-up prediabetes. He denies any polyuria polyphagia or polydipsia. No outside blood sugars report  He has a small sore on his back that's been bothering him for the last 2 weeks. It's painful whenever he has anything brush up against it. He denies any discharge or skin lesions elsewhere   Review Of Systems Outlined In HPI  Past Medical History  Diagnosis Date  . CAD (coronary artery disease)     with stenting X 4 in [place]  . Hypertension   . Depression   . Anxiety   . Hyperlipidemia   . Macular degeneration     Past Surgical History  Procedure Laterality Date  . Appendectomy    . Cholecystectomy    . Tonsillectomy     Family History  Problem Relation Age of Onset  . Hypertension Mother   . Hyperlipidemia Mother   . Diabetes Mother   . Other Mother     CVA  . Depression Mother   . Other Sister     brain tumor  . Other Brother     CVA    Social History   Social History  . Marital Status: Married    Spouse Name: N/A  . Number of Children: N/A  . Years of Education: N/A   Occupational History  . Not on file.   Social History Main Topics  . Smoking status: Never Smoker   . Smokeless tobacco: Not on file  . Alcohol Use: Yes     Comment: 2 daily  . Drug Use: No  . Sexual Activity: Not Currently   Other Topics Concern  . Not  on file   Social History Narrative     Objective: BP 151/74 mmHg  Pulse 59  Wt 216 lb (97.977 kg)  General: Alert and Oriented, No Acute Distress HEENT: Pupils equal, round, reactive to light. Conjunctivae clear. Moist mucous membranes Lungs: Clear to auscultation bilaterally, no wheezing/ronchi/rales.  Comfortable work of breathing. Good air movement. Cardiac: Regular rate and rhythm. Normal S1/S2.  No murmurs, rubs, nor gallops.   Extremities: No peripheral edema.  Strong peripheral pulses.  Mental Status: No depression, anxiety, nor agitation. Skin: Warm and dry. Moderately erythematous wart on the back just above the sacrum.  Assessment & Plan: Adell was seen today for hypertension and medication refill.  Diagnoses and all orders for this visit:  Essential hypertension, benign -     furosemide (LASIX) 20 MG tablet; Take 1 tablet (20 mg total) by mouth 2 (two) times daily. -     doxazosin (CARDURA) 4 MG tablet; Take 1 tablet (4 mg total) by mouth at bedtime.  Atherosclerosis of native coronary artery of native heart without angina pectoris -     ticagrelor (BRILINTA) 90 MG TABS tablet; Take 1 tablet (90 mg total) by mouth 2 (two) times daily. -     furosemide (LASIX) 20 MG  tablet; Take 1 tablet (20 mg total) by mouth 2 (two) times daily. -     doxazosin (CARDURA) 4 MG tablet; Take 1 tablet (4 mg total) by mouth at bedtime.  Vitamin B 12 deficiency -     vitamin B-12 (CYANOCOBALAMIN) 1000 MCG tablet; Take 1 tablet (1,000 mcg total) by mouth daily. -     Vitamin B12  Prediabetes -     Hemoglobin A1c  Dermatitis -     triamcinolone cream (KENALOG) 0.1 %; Apply to affected areas twice a day for up to two weeks, avoid face.   Essential hypertension: Uncontrolled off a car door restart her regimen CAD: Stable a systematic continue brilinta B12 deficiency: Refills provided but I would like to get a current B12 level Prediabetes: Asymptomatic, check an A1c I've encouraged him  to use triamcinolone on his wart on the back to help calm down the inflammation.  No Follow-up on file.

## 2015-07-22 LAB — HEMOGLOBIN A1C
HEMOGLOBIN A1C: 5.5 % (ref <5.7)
MEAN PLASMA GLUCOSE: 111 mg/dL

## 2015-07-22 LAB — VITAMIN B12: VITAMIN B 12: 629 pg/mL (ref 200–1100)

## 2015-07-26 ENCOUNTER — Other Ambulatory Visit: Payer: Self-pay | Admitting: Family Medicine

## 2015-08-02 ENCOUNTER — Telehealth: Payer: Self-pay

## 2015-08-02 MED ORDER — CLOBETASOL PROPIONATE 0.05 % EX CREA: 1.0000 "application " | TOPICAL_CREAM | Freq: Two times a day (BID) | CUTANEOUS | 0 refills | Status: AC

## 2015-08-02 NOTE — Telephone Encounter (Signed)
Detailed message left for pt

## 2015-08-02 NOTE — Telephone Encounter (Signed)
Clobetasol cream sent to [facility].  If it's not better by Friday I can freeze or surgically remove the spot on his back if he would like.

## 2015-08-04 DIAGNOSIS — I252 Old myocardial infarction: Secondary | ICD-10-CM | POA: Diagnosis not present

## 2015-08-04 DIAGNOSIS — I5032 Chronic diastolic (congestive) heart failure: Secondary | ICD-10-CM | POA: Diagnosis not present

## 2015-08-04 DIAGNOSIS — Z955 Presence of coronary angioplasty implant and graft: Secondary | ICD-10-CM | POA: Diagnosis not present

## 2015-08-04 DIAGNOSIS — I110 Hypertensive heart disease with heart failure: Secondary | ICD-10-CM | POA: Diagnosis not present

## 2015-08-04 DIAGNOSIS — I2510 Atherosclerotic heart disease of native coronary artery without angina pectoris: Secondary | ICD-10-CM | POA: Diagnosis not present

## 2015-08-10 ENCOUNTER — Encounter: Payer: Self-pay | Admitting: Family Medicine

## 2015-08-17 ENCOUNTER — Ambulatory Visit (INDEPENDENT_AMBULATORY_CARE_PROVIDER_SITE_OTHER): Payer: Medicare Other

## 2015-08-17 ENCOUNTER — Encounter: Payer: Self-pay | Admitting: Family Medicine

## 2015-08-17 ENCOUNTER — Ambulatory Visit (INDEPENDENT_AMBULATORY_CARE_PROVIDER_SITE_OTHER): Payer: Medicare Other | Admitting: Family Medicine

## 2015-08-17 VITALS — BP 179/72 | HR 55 | Wt 218.0 lb

## 2015-08-17 DIAGNOSIS — R079 Chest pain, unspecified: Secondary | ICD-10-CM | POA: Diagnosis not present

## 2015-08-17 DIAGNOSIS — I2510 Atherosclerotic heart disease of native coronary artery without angina pectoris: Secondary | ICD-10-CM | POA: Diagnosis not present

## 2015-08-17 DIAGNOSIS — R0602 Shortness of breath: Secondary | ICD-10-CM | POA: Diagnosis not present

## 2015-08-17 DIAGNOSIS — F32A Depression, unspecified: Secondary | ICD-10-CM

## 2015-08-17 DIAGNOSIS — I517 Cardiomegaly: Secondary | ICD-10-CM

## 2015-08-17 DIAGNOSIS — F329 Major depressive disorder, single episode, unspecified: Secondary | ICD-10-CM | POA: Diagnosis not present

## 2015-08-17 DIAGNOSIS — I10 Essential (primary) hypertension: Secondary | ICD-10-CM | POA: Diagnosis not present

## 2015-08-17 MED ORDER — SERTRALINE HCL 50 MG PO TABS: 50.0000 mg | ORAL_TABLET | Freq: Every day | ORAL | 1 refills | Status: DC

## 2015-08-17 MED ORDER — CARVEDILOL 6.25 MG PO TABS: 6.2500 mg | ORAL_TABLET | Freq: Two times a day (BID) | ORAL | 1 refills | Status: DC

## 2015-08-17 MED ORDER — AMLODIPINE BESYLATE 5 MG PO TABS
5.0000 mg | ORAL_TABLET | Freq: Every day | ORAL | 1 refills | Status: DC
Start: 2015-08-17 — End: 2016-05-26

## 2015-08-17 NOTE — Progress Notes (Signed)
CC: Carlos Harrington is a 82 y.o. male is here for Chest Pain   Subjective: HPI:  Chest discomfort ever since the second of this month. It's described as a sharpness in the anterior aspect of the chest and nonradiating. He can make it worse when it's present by taking a deep breath. There is no other exertional component to his pain. He had a reassuring stress test earlier this month. Symptoms last anywhere from 2-5 minutes and occur 1-2 times a day. No interventions as of yet. He denies shortness of breath, cough, wheezing or fever.  He ran out of his blood pressure medication earlier this week. He is not sure if he needs to be on amlodipine or not. No outside blood pressures reported. Denies chest pain shortness of breath orthopnea nor peripheral edema.  His daughter's concern that he is experiencing some worsening depression. I discontinued Lexapro a little over a month ago however the pharmacy has continued to fill this and he continues to take it. She tells me he seems more gloomy more often. Patient tells me he is not really sure how he feels but admits that he is having irregular sleep patterns. No thoughts or not harm self or others.   Review Of Systems Outlined In HPI  Past Medical History:  Diagnosis Date  . Anxiety   . CAD (coronary artery disease)    with stenting X 4 in [place]  . Depression   . Hyperlipidemia   . Hypertension   . Macular degeneration     Past Surgical History:  Procedure Laterality Date  . APPENDECTOMY    . CHOLECYSTECTOMY    . TONSILLECTOMY     Family History  Problem Relation Age of Onset  . Hypertension Mother   . Hyperlipidemia Mother   . Diabetes Mother   . Other Mother     CVA  . Depression Mother   . Other Sister     brain tumor  . Other Brother     CVA    Social History   Social History  . Marital status: Married    Spouse name: N/A  . Number of children: N/A  . Years of education: N/A   Occupational History  . Not on file.   Social  History Main Topics  . Smoking status: Never Smoker  . Smokeless tobacco: Not on file  . Alcohol use Yes     Comment: 2 daily  . Drug use: No  . Sexual activity: Not Currently   Other Topics Concern  . Not on file   Social History Narrative  . No narrative on file     Objective: BP (!) 179/72   Pulse (!) 55   Wt 218 lb (98.9 kg)   BMI 34.14 kg/m   Vital signs reviewed. General: Alert and Oriented, No Acute Distress HEENT: Pupils equal, round, reactive to light. Conjunctivae clear.  External ears unremarkable.  Moist mucous membranes. Lungs: Clear and comfortable work of breathing, speaking in full sentences without accessory muscle use. No wheezing rhonchi or rales Cardiac: Regular rate and rhythm. No murmurs rubs or gallops Neuro: CN II-XII grossly intact, gait normal. Extremities: No peripheral edema.  Strong peripheral pulses.  Mental Status: No depression, anxiety, nor agitation. Logical though process. Skin: Warm and dry.  Assessment & Plan: [name] was seen today for chest pain.  Diagnoses and all orders for this visit:  Essential hypertension, benign -     DG Chest 2 View  Chest pain, unspecified chest pain  type  Depression  Other orders -     amLODipine (NORVASC) 5 MG tablet; Take 1 tablet (5 mg total) by mouth daily. -     carvedilol (COREG) 6.25 MG tablet; Take 1 tablet (6.25 mg total) by mouth 2 (two) times daily. -     sertraline (ZOLOFT) 50 MG tablet; Take 1 tablet (50 mg total) by mouth daily. Please discontinue lexapro.   Essential hypertension: Uncontrolled chronic condition restart amlodipine continue carvedilol Chest discomfort: Low suspicion of cardiac etiology checking a chest x-ray and if normal will complete at this costochondritis Depression: Worsening, switching from Lexapro to Zoloft  Discussed with this patient that I will be resigning from my position here with [facility] in September in order to stay with my family who will be moving to  [facility]. I let him know about the providers that are still accepting patients and I feel that this individual will be under great care if he/she stays here with [facility].  Return for In one month with new provider - mood and BP.

## 2015-08-31 ENCOUNTER — Other Ambulatory Visit: Payer: Self-pay | Admitting: Family Medicine

## 2015-09-17 ENCOUNTER — Other Ambulatory Visit: Payer: Self-pay | Admitting: Family Medicine

## 2015-10-01 ENCOUNTER — Telehealth: Payer: Self-pay

## 2015-10-01 NOTE — Telephone Encounter (Signed)
Pts daughter left a detailed vm stating that the pt will be switching from Dr. Ileene Rubens to Dr. Georgina Snell and that he needs a rx for a stair lift to installed in the home. Please advise.

## 2015-10-04 NOTE — Telephone Encounter (Signed)
Information discussed with pt contact. Appointment scheduled.

## 2015-10-04 NOTE — Telephone Encounter (Signed)
Please schedule an appointment to discuss this rx. Bring any forms that are relevant. We need to justify this so more information is helpful.

## 2015-10-06 ENCOUNTER — Ambulatory Visit (INDEPENDENT_AMBULATORY_CARE_PROVIDER_SITE_OTHER): Payer: Medicare Other | Admitting: Family Medicine

## 2015-10-06 ENCOUNTER — Ambulatory Visit (INDEPENDENT_AMBULATORY_CARE_PROVIDER_SITE_OTHER): Payer: Medicare Other

## 2015-10-06 ENCOUNTER — Encounter: Payer: Self-pay | Admitting: Family Medicine

## 2015-10-06 VITALS — BP 146/62 | HR 57 | Wt 219.0 lb

## 2015-10-06 DIAGNOSIS — N183 Chronic kidney disease, stage 3 unspecified: Secondary | ICD-10-CM

## 2015-10-06 DIAGNOSIS — I10 Essential (primary) hypertension: Secondary | ICD-10-CM

## 2015-10-06 DIAGNOSIS — I2510 Atherosclerotic heart disease of native coronary artery without angina pectoris: Secondary | ICD-10-CM | POA: Diagnosis not present

## 2015-10-06 DIAGNOSIS — I5041 Acute combined systolic (congestive) and diastolic (congestive) heart failure: Secondary | ICD-10-CM | POA: Diagnosis not present

## 2015-10-06 DIAGNOSIS — N401 Enlarged prostate with lower urinary tract symptoms: Secondary | ICD-10-CM

## 2015-10-06 DIAGNOSIS — L309 Dermatitis, unspecified: Secondary | ICD-10-CM | POA: Diagnosis not present

## 2015-10-06 DIAGNOSIS — R0602 Shortness of breath: Secondary | ICD-10-CM

## 2015-10-06 DIAGNOSIS — F331 Major depressive disorder, recurrent, moderate: Secondary | ICD-10-CM

## 2015-10-06 DIAGNOSIS — E785 Hyperlipidemia, unspecified: Secondary | ICD-10-CM

## 2015-10-06 DIAGNOSIS — Z974 Presence of external hearing-aid: Secondary | ICD-10-CM

## 2015-10-06 DIAGNOSIS — Z23 Encounter for immunization: Secondary | ICD-10-CM | POA: Diagnosis not present

## 2015-10-06 DIAGNOSIS — Z66 Do not resuscitate: Secondary | ICD-10-CM

## 2015-10-06 MED ORDER — TRIAMCINOLONE ACETONIDE 0.1 % EX CREA: TOPICAL_CREAM | Freq: Two times a day (BID) | CUTANEOUS | 12 refills | Status: DC

## 2015-10-06 MED ORDER — AMBULATORY NON FORMULARY MEDICATION: 0 refills | Status: AC

## 2015-10-06 NOTE — Patient Instructions (Addendum)
Thank you for coming in today. Use the stair lift as needed.  Get fasting labs soon.  Recheck in 1-3 months.   Use the cream as needed.  Call or go to the emergency room if you get worse, have trouble breathing, have chest pains, or palpitations.

## 2015-10-06 NOTE — Progress Notes (Signed)
Carlos Harrington is a 82 y.o. male who presents to [facility]: [facility] today for  1) poor mobility and shortness of breath with exertion. Patient has difficulty climbing stairs due to shortness of breath. This is due to his systolic and diastolic heart failure. He would like a stair lift installed in his house and would like a prescription for this. This will give him a discount. Symptoms are stable. He's been seen by his cardiologist recently who obtained a stress test which was normal with exception of a ejection fraction of 46%..  2) depression: Patient is doing well with his depression. He currently takes Zoloft daily and 50 mg. He does note some difficulty sleeping as well but is not taking the trazodone that has been historically prescribed.  He does note some memory impairment and has an appointment upcoming to further evaluate this problem.[name]   3) itching: Patient notes itching across his entire body. This is been ongoing for several months. He notes this is been treated well with triamcinolone cream in the past. No fevers chills nausea vomiting or diarrhea. He notes that he stopped all of his medicines for a few weeks and the itching resolved and return when he restarted all of his medicines. He denies significant rash.     Past Medical History:  Diagnosis Date  . Anxiety   . CAD (coronary artery disease)    with stenting X 4 in [place]  . Depression   . Hyperlipidemia   . Hypertension   . Macular degeneration    Past Surgical History:  Procedure Laterality Date  . APPENDECTOMY    . CHOLECYSTECTOMY    . TONSILLECTOMY     Social History  Substance Use Topics  . Smoking status: Never Smoker  . Smokeless tobacco: Not on file  . Alcohol use Yes     Comment: 2 daily   family history includes Depression in his mother; Diabetes in his mother; Hyperlipidemia in his mother;  Hypertension in his mother; Other in his brother, mother, and sister.  ROS as above:  Medications: Current Outpatient Prescriptions  Medication Sig Dispense Refill  . albuterol (PROVENTIL HFA;VENTOLIN HFA) 108 (90 BASE) MCG/ACT inhaler Inhale 1-2 puffs into the lungs every 6 (six) hours as needed for wheezing or shortness of breath. 1 Inhaler 0  . amLODipine (NORVASC) 5 MG tablet Take 1 tablet (5 mg total) by mouth daily. 90 tablet 1  . aspirin 81 MG EC tablet TAKE ONE TABLET BY MOUTH EVERY DAY 28 tablet 0  . atorvastatin (LIPITOR) 80 MG tablet Take 1 tablet (80 mg total) by mouth daily.    . budesonide-formoterol (SYMBICORT) 160-4.5 MCG/ACT inhaler Inhale 1 puff into the lungs 2 (two) times daily. 1 Inhaler 3  . carvedilol (COREG) 6.25 MG tablet Take 1 tablet (6.25 mg total) by mouth 2 (two) times daily. 180 tablet 1  . clobetasol cream (TEMOVATE) [id number] % Apply 1 application topically 2 (two) times daily. 30 g 0  . diazepam (VALIUM) 5 MG tablet Take 0.5-1 tablets (2.5-5 mg total) by mouth every 12 (twelve) hours as needed for anxiety. Only under the supervision of a non-sedated companion. 15 tablet 0  . doxazosin (CARDURA) 4 MG tablet Take 1 tablet (4 mg total) by mouth at bedtime. 90 tablet 1  . furosemide (LASIX) 20 MG tablet Take 1 tablet (20 mg total) by mouth 2 (two) times daily. 180 tablet 1  . ipratropium-albuterol (DUONEB) 0.5-2.5 (3)  MG/3ML SOLN Take 3 mLs by nebulization every 4 (four) hours as needed (cough or wheezing). 360 mL 1  . polycarbophil (FIBERCON) 625 MG tablet One by mouth twice a day.    . sertraline (ZOLOFT) 50 MG tablet Take 1 tablet (50 mg total) by mouth daily. Please discontinue lexapro. 90 tablet 1  . spironolactone (ALDACTONE) 25 MG tablet     . ticagrelor (BRILINTA) 90 MG TABS tablet Take 1 tablet (90 mg total) by mouth 2 (two) times daily. 180 tablet 0  . Tiotropium Bromide-Olodaterol (STIOLTO RESPIMAT) 2.5-2.5 MCG/ACT AERS Inhale 1 puff into the lungs daily. 1  Inhaler 0  . traMADol (ULTRAM) 50 MG tablet Take 1 tablet (50 mg total) by mouth every 8 (eight) hours as needed. 15 tablet 0  . triamcinolone cream (KENALOG) 0.1 % Apply topically 2 (two) times daily. 453.6 g 12  . vitamin B-12 (CYANOCOBALAMIN) 1000 MCG tablet Take 1 tablet (1,000 mcg total) by mouth daily. 90 tablet 0  . AMBULATORY NON FORMULARY MEDICATION Stair Lift at home for use as needed. 1 each 0   No current facility-administered medications for this visit.    No Known Allergies   Exam:  BP (!) 146/62   Pulse (!) 57   Wt 219 lb (99.3 kg)   BMI 34.30 kg/m  Gen: Well NAD Hard of hearing HEENT: EOMI,  MMM no JVD Lungs: Normal work of breathing. CTABL Heart: RRR no MRG Abd: NABS, Soft. Nondistended, Nontender Exts: Brisk capillary refill, warm and well perfused.  1+ lower extremity edema present bilaterally  No results found for this or any previous visit (from the past 24 hour(s)). No results found.    Assessment and Plan: 82 y.o. male with  1) heart failure: Stable poor functional status. Plan for stair lift. Continue management per cardiology. Obtain fasting labs  2) depression: Moderately controlled. Continue current regimen. Recheck in the near future for memory and cognition assessment.  3) itching: No rash visible. Unclear etiology. Medications are certainly a possibility. Continue triamcinolone cream and recheck in the near future.  Flu vaccine given prior to discharge.   Orders Placed This Encounter  Procedures  . CBC  . COMPLETE METABOLIC PANEL WITH GFR  . Lipid panel  . TSH  . PSA    Discussed warning signs or symptoms. Please see discharge instructions. Patient expresses understanding.

## 2015-10-25 ENCOUNTER — Ambulatory Visit (INDEPENDENT_AMBULATORY_CARE_PROVIDER_SITE_OTHER): Payer: Medicare Other | Admitting: Family Medicine

## 2015-10-25 VITALS — BP 160/65 | HR 64 | Wt 217.0 lb

## 2015-10-25 DIAGNOSIS — Z974 Presence of external hearing-aid: Secondary | ICD-10-CM

## 2015-10-25 DIAGNOSIS — I2510 Atherosclerotic heart disease of native coronary artery without angina pectoris: Secondary | ICD-10-CM

## 2015-10-25 DIAGNOSIS — R413 Other amnesia: Secondary | ICD-10-CM

## 2015-10-25 DIAGNOSIS — E538 Deficiency of other specified B group vitamins: Secondary | ICD-10-CM | POA: Diagnosis not present

## 2015-10-25 DIAGNOSIS — F09 Unspecified mental disorder due to known physiological condition: Secondary | ICD-10-CM | POA: Insufficient documentation

## 2015-10-25 NOTE — Progress Notes (Signed)
Carlos Harrington is a 82 y.o. male who presents to [facility]: [place] today for memory. Patient is subjective decreased memory in the past several years. He has a pertinent medical history for CVAs as well as significantly impaired hearing. He has a implant which has not helped very much at all. He notes he has great difficulty or remembering people's names. For example when he plays bridge or pinochle he's able to pay attention to and remember how to play the card game but cannot remember his partners names. He notes he tends to forget things around the house as well. He notes he was very high functioning until recently. His daughter thinks his memory has faded a bit as well.   Past Medical History:  Diagnosis Date  . Anxiety   . CAD (coronary artery disease)    with stenting X 4 in [place]  . Depression   . Hyperlipidemia   . Hypertension   . Macular degeneration    Past Surgical History:  Procedure Laterality Date  . APPENDECTOMY    . CHOLECYSTECTOMY    . TONSILLECTOMY     Social History  Substance Use Topics  . Smoking status: Never Smoker  . Smokeless tobacco: Not on file  . Alcohol use Yes     Comment: 2 daily   family history includes Depression in his mother; Diabetes in his mother; Hyperlipidemia in his mother; Hypertension in his mother; Other in his brother, mother, and sister.  ROS as above:  Medications: Current Outpatient Prescriptions  Medication Sig Dispense Refill  . albuterol (PROVENTIL HFA;VENTOLIN HFA) 108 (90 BASE) MCG/ACT inhaler Inhale 1-2 puffs into the lungs every 6 (six) hours as needed for wheezing or shortness of breath. 1 Inhaler 0  . AMBULATORY NON FORMULARY MEDICATION Stair Lift at home for use as needed. 1 each 0  . amLODipine (NORVASC) 5 MG tablet Take 1 tablet (5 mg total) by mouth daily. 90 tablet 1  . aspirin 81 MG EC tablet TAKE ONE TABLET  BY MOUTH EVERY DAY 28 tablet 0  . atorvastatin (LIPITOR) 80 MG tablet Take 1 tablet (80 mg total) by mouth daily.    . budesonide-formoterol (SYMBICORT) 160-4.5 MCG/ACT inhaler Inhale 1 puff into the lungs 2 (two) times daily. 1 Inhaler 3  . carvedilol (COREG) 6.25 MG tablet Take 1 tablet (6.25 mg total) by mouth 2 (two) times daily. 180 tablet 1  . clobetasol cream (TEMOVATE) [id number] % Apply 1 application topically 2 (two) times daily. 30 g 0  . diazepam (VALIUM) 5 MG tablet Take 0.5-1 tablets (2.5-5 mg total) by mouth every 12 (twelve) hours as needed for anxiety. Only under the supervision of a non-sedated companion. 15 tablet 0  . doxazosin (CARDURA) 4 MG tablet Take 1 tablet (4 mg total) by mouth at bedtime. 90 tablet 1  . furosemide (LASIX) 20 MG tablet Take 1 tablet (20 mg total) by mouth 2 (two) times daily. 180 tablet 1  . ipratropium-albuterol (DUONEB) 0.5-2.5 (3) MG/3ML SOLN Take 3 mLs by nebulization every 4 (four) hours as needed (cough or wheezing). 360 mL 1  . polycarbophil (FIBERCON) 625 MG tablet One by mouth twice a day.    . sertraline (ZOLOFT) 50 MG tablet Take 1 tablet (50 mg total) by mouth daily. Please discontinue lexapro. 90 tablet 1  . spironolactone (ALDACTONE) 25 MG tablet     . ticagrelor (BRILINTA) 90 MG TABS tablet Take 1 tablet (90 mg  total) by mouth 2 (two) times daily. 180 tablet 0  . Tiotropium Bromide-Olodaterol (STIOLTO RESPIMAT) 2.5-2.5 MCG/ACT AERS Inhale 1 puff into the lungs daily. 1 Inhaler 0  . traMADol (ULTRAM) 50 MG tablet Take 1 tablet (50 mg total) by mouth every 8 (eight) hours as needed. 15 tablet 0  . triamcinolone cream (KENALOG) 0.1 % Apply topically 2 (two) times daily. 453.6 g 12  . vitamin B-12 (CYANOCOBALAMIN) 1000 MCG tablet Take 1 tablet (1,000 mcg total) by mouth daily. 90 tablet 0   No current facility-administered medications for this visit.    No Known Allergies  Health Maintenance Health Maintenance  Topic Date Due  . PNA vac Low  Risk Adult (2 of 2 - PPSV23) 11/17/2015  . TETANUS/TDAP  07/26/2024  . INFLUENZA VACCINE  Completed  . ZOSTAVAX  Completed     Exam:  BP (!) 160/65   Pulse 64   Wt 217 lb (98.4 kg)   BMI 33.99 kg/m   (patient had not yet taken his medicines for today.) Gen: Well NAD Nontoxic appearing HEENT: EOMI,  MMM Lungs: Normal work of breathing. CTABL Heart: RRR no MRG Abd: NABS, Soft. Nondistended, Nontender Exts: Brisk capillary refill, warm and well perfused.   6Cit: 4 (normal range) MMSE: 28 (normal range)   No results found for this or any previous visit (from the past 72 hour(s)). No results found.    Assessment and Plan: 82 y.o. male with subjectively impaired memory. Obtained previously ordered fasting labs as well as labs listed below. Patient would like referral to neurology for further evaluation which I feel is reasonable. Although he screened well today I'm concerned that his symptoms may be more apparent to him because he is started at a high level of cognitive functioning and a small decline may be subjectively apparent but negative to screening tests.    Orders Placed This Encounter  Procedures  . Vitamin B12  . RPR  . Ambulatory referral to Neurology    Referral Priority:   Routine    Referral Type:   Consultation    Referral Reason:   Specialty Services Required    Requested Specialty:   Neurology    Number of Visits Requested:   1     Neuro imaging tests from June 2016 from [place] added below  IMPRESSION:  1.No acute intracranial abnormality.Specifically, there is no CT evidence of acute bleed or infarct.  Left cochlear implant.  Result Narrative  INDICATION:Acute stroke.Stroke/TIA,   TECHNIQUE: Axial scans were performed through the head without contrast obtained on6/01/2014 12:49 PM. In addition, a CT topogram was performed through the chest, abdomen, and pelvis to screen for metallic foreign  bodies.  COMPARISON:05/26/14  FINDINGS:  #Significant artifact related to the left cochlear implant. Old lacunar infarcts left caudate, left thalamus, and left subinsular region. #No hydrocephalus. #No intracranial mass, mass effect or midline shift. #No acute infarction evident. #No intracranial hemorrhage. #Paranasal sinuses:Clear. #Mastoid air cells:Left mastoidectomy with left cochlear implant. #Calvarium:Intact. #Soft tissues:Unremarkable.   Evaluation of the orbits demonstrates no abnormality and no evidence of metallic foreign body.  The topogram of the chest, abdomen, and pelvis demonstrates no pacemaker, spinal cord stimulator, or metallic foreign body adjacent to the spine.Left cochlear implant is present.   CT Code Stroke Brain Neck Angio6/01/2014 [facility] Result Impression  IMPRESSION:  1. No evidence of large vessel arterial occlusion. 2. Intracranial atherosclerotic disease, most pronounced within the anterior cerebral and posterior cerebral arteries with multiple areas of moderate  to severe narrowing. 3. Mild to moderate narrowing within the proximal supraclinoid ICAs. 4. Plaque within the bilateral carotid bifurcations, no significant stenosis. 5. Erosive appearance of the dens, correlate clinically for evidence of rheumatoid arthritis.  Degree of stenosis is determined using NASCET measurement technique: Severe: 70-99% Moderate: 50-69%. Mild: Less than 50%.  Result Narrative  CTA carotids/intracranial:  INDICATION: Right leg weakness.  TECHNIQUE: TECHNIQUE: Thin axial scans were performed from the top of the aortic arch up through the neck and head after bolus injection of 75 mL of Isovue-370. 3-D MIP images were generated.  COMPARISON: Same day head CT. CTA neck from 05/25/2014. MRI from 08/04/2011  FINDINGS:  Neck vessels: #Aortic arch and brachiocephalic vessels: Scattered atherosclerotic plaque. #Right  vertebral: Patent. V4 segment terminates in PICA. #Left vertebral: Patent #Right carotid artery: Patent. Small amount of plaque is present at the bifurcation, no significant stenosis. Flow artifact noted at the bifurcation. #Left carotid artery: Patent. Small amount of plaque at the bifurcation without significant stenosis.  Intracranial vessels: #Distal internal carotid arteries:Plaque is present within the bilateral cavernous carotids. Mild to moderate focal narrowing within the proximal supraclinoid bilaterally appears unchanged. #Anterior cerebral arteries: Patent. There are multiple areas of moderate to severe focal narrowing throughout the bilateral ACA branches. #Middle cerebral arteries: Patent. #Distal vertebral arteries: Patent. The right V4 segment terminates in PICA. #Basilar artery: Patent. #Posterior cerebral arteries: Patent. Redemonstrated multiple areas of atherosclerotic narrowing within the posterior cerebral arteries, most notable on the left.  #Dural sinuses: Visualized dural sinuses are patent.  #Additional findings: Surgical changes of left-sided mastoidectomy with cochlear implant. Subcentimeter thyroid nodules. Erosive appearance of the dens. Multilevel cervical spondylosis.     Discussed warning signs or symptoms. Please see discharge instructions. Patient expresses understanding.

## 2015-10-25 NOTE — Patient Instructions (Signed)
Thank you for coming in today. Get fasting labs soon.  You should get a call about neurology soon.  Return in 6 months or sooner if needed.   Memory Compensation Strategies  1. Use "WARM" strategy.  W= write it down  A= associate it  R= repeat it  M= make a mental note  2.   You can keep a Social worker.  Use a 3-ring notebook with sections for the following: calendar, important names and phone numbers,  medications, doctors' names/phone numbers, lists/reminders, and a section to journal what you did  each day.   3.    Use a calendar to write appointments down.  4.    Write yourself a schedule for the day.  This can be placed on the calendar or in a separate section of the Memory Notebook.  Keeping a  regular schedule can help memory.  5.    Use medication organizer with sections for each day or morning/evening pills.  You may need help loading it  6.    Keep a basket, or pegboard by the door.  Place items that you need to take out with you in the basket or on the pegboard.  You may also want to  include a message board for reminders.  7.    Use sticky notes.  Place sticky notes with reminders in a place where the task is performed.  For example: " turn off the  stove" placed by the stove, "lock the door" placed on the door at eye level, " take your medications" on  the bathroom mirror or by the place where you normally take your medications.  8.    Use alarms/timers.  Use while cooking to remind yourself to check on food or as a reminder to take your medicine, or as a  reminder to make a call, or as a reminder to perform another task, etc.

## 2015-11-20 DIAGNOSIS — Z7982 Long term (current) use of aspirin: Secondary | ICD-10-CM | POA: Diagnosis not present

## 2015-11-20 DIAGNOSIS — R51 Headache: Secondary | ICD-10-CM | POA: Diagnosis not present

## 2015-11-20 DIAGNOSIS — R0602 Shortness of breath: Secondary | ICD-10-CM | POA: Diagnosis not present

## 2015-11-20 DIAGNOSIS — Z8673 Personal history of transient ischemic attack (TIA), and cerebral infarction without residual deficits: Secondary | ICD-10-CM | POA: Diagnosis not present

## 2015-11-20 DIAGNOSIS — Z955 Presence of coronary angioplasty implant and graft: Secondary | ICD-10-CM | POA: Diagnosis not present

## 2015-11-20 DIAGNOSIS — Z79899 Other long term (current) drug therapy: Secondary | ICD-10-CM | POA: Diagnosis not present

## 2015-11-20 DIAGNOSIS — I517 Cardiomegaly: Secondary | ICD-10-CM | POA: Diagnosis not present

## 2015-11-20 DIAGNOSIS — I509 Heart failure, unspecified: Secondary | ICD-10-CM | POA: Diagnosis not present

## 2015-11-20 DIAGNOSIS — I10 Essential (primary) hypertension: Secondary | ICD-10-CM | POA: Diagnosis not present

## 2015-11-20 DIAGNOSIS — J449 Chronic obstructive pulmonary disease, unspecified: Secondary | ICD-10-CM | POA: Diagnosis not present

## 2015-11-20 DIAGNOSIS — I110 Hypertensive heart disease with heart failure: Secondary | ICD-10-CM | POA: Diagnosis not present

## 2015-11-20 DIAGNOSIS — R05 Cough: Secondary | ICD-10-CM | POA: Diagnosis not present

## 2015-11-20 DIAGNOSIS — E785 Hyperlipidemia, unspecified: Secondary | ICD-10-CM | POA: Diagnosis not present

## 2015-11-22 DIAGNOSIS — H348312 Tributary (branch) retinal vein occlusion, right eye, stable: Secondary | ICD-10-CM | POA: Diagnosis not present

## 2015-11-22 DIAGNOSIS — H40033 Anatomical narrow angle, bilateral: Secondary | ICD-10-CM | POA: Diagnosis not present

## 2015-11-22 DIAGNOSIS — H532 Diplopia: Secondary | ICD-10-CM | POA: Diagnosis not present

## 2015-11-22 DIAGNOSIS — H353132 Nonexudative age-related macular degeneration, bilateral, intermediate dry stage: Secondary | ICD-10-CM | POA: Diagnosis not present

## 2015-12-06 DIAGNOSIS — I5032 Chronic diastolic (congestive) heart failure: Secondary | ICD-10-CM | POA: Diagnosis not present

## 2015-12-06 DIAGNOSIS — I252 Old myocardial infarction: Secondary | ICD-10-CM | POA: Diagnosis not present

## 2015-12-06 DIAGNOSIS — I2510 Atherosclerotic heart disease of native coronary artery without angina pectoris: Secondary | ICD-10-CM | POA: Diagnosis not present

## 2015-12-06 DIAGNOSIS — Z955 Presence of coronary angioplasty implant and graft: Secondary | ICD-10-CM | POA: Diagnosis not present

## 2015-12-06 DIAGNOSIS — I10 Essential (primary) hypertension: Secondary | ICD-10-CM | POA: Diagnosis not present

## 2015-12-09 DIAGNOSIS — I252 Old myocardial infarction: Secondary | ICD-10-CM | POA: Diagnosis not present

## 2015-12-09 DIAGNOSIS — I2510 Atherosclerotic heart disease of native coronary artery without angina pectoris: Secondary | ICD-10-CM | POA: Diagnosis not present

## 2015-12-09 DIAGNOSIS — R413 Other amnesia: Secondary | ICD-10-CM | POA: Diagnosis not present

## 2015-12-09 DIAGNOSIS — I10 Essential (primary) hypertension: Secondary | ICD-10-CM | POA: Diagnosis not present

## 2015-12-29 DIAGNOSIS — H353132 Nonexudative age-related macular degeneration, bilateral, intermediate dry stage: Secondary | ICD-10-CM | POA: Diagnosis not present

## 2015-12-29 DIAGNOSIS — H527 Unspecified disorder of refraction: Secondary | ICD-10-CM | POA: Diagnosis not present

## 2015-12-29 DIAGNOSIS — H532 Diplopia: Secondary | ICD-10-CM | POA: Diagnosis not present

## 2015-12-29 DIAGNOSIS — H348312 Tributary (branch) retinal vein occlusion, right eye, stable: Secondary | ICD-10-CM | POA: Diagnosis not present

## 2015-12-29 DIAGNOSIS — H40033 Anatomical narrow angle, bilateral: Secondary | ICD-10-CM | POA: Diagnosis not present

## 2016-01-05 ENCOUNTER — Other Ambulatory Visit: Payer: Self-pay | Admitting: Family Medicine

## 2016-01-06 ENCOUNTER — Other Ambulatory Visit: Payer: Self-pay | Admitting: *Deleted

## 2016-01-06 ENCOUNTER — Other Ambulatory Visit: Payer: Self-pay | Admitting: Family Medicine

## 2016-01-06 DIAGNOSIS — E538 Deficiency of other specified B group vitamins: Secondary | ICD-10-CM

## 2016-01-07 ENCOUNTER — Other Ambulatory Visit: Payer: Self-pay | Admitting: Family Medicine

## 2016-01-07 DIAGNOSIS — I2510 Atherosclerotic heart disease of native coronary artery without angina pectoris: Secondary | ICD-10-CM

## 2016-01-07 DIAGNOSIS — F329 Major depressive disorder, single episode, unspecified: Secondary | ICD-10-CM | POA: Diagnosis not present

## 2016-01-07 DIAGNOSIS — G3184 Mild cognitive impairment, so stated: Secondary | ICD-10-CM | POA: Diagnosis not present

## 2016-01-07 HISTORY — DX: Mild cognitive impairment of uncertain or unknown etiology: G31.84

## 2016-01-10 NOTE — Telephone Encounter (Signed)
If refill appropriate? Please advise.

## 2016-01-11 ENCOUNTER — Other Ambulatory Visit: Payer: Self-pay

## 2016-01-11 ENCOUNTER — Ambulatory Visit (INDEPENDENT_AMBULATORY_CARE_PROVIDER_SITE_OTHER): Payer: Medicare Other

## 2016-01-11 ENCOUNTER — Ambulatory Visit (INDEPENDENT_AMBULATORY_CARE_PROVIDER_SITE_OTHER): Payer: Medicare Other | Admitting: Sports Medicine

## 2016-01-11 ENCOUNTER — Encounter: Payer: Self-pay | Admitting: Sports Medicine

## 2016-01-11 DIAGNOSIS — R059 Cough, unspecified: Secondary | ICD-10-CM

## 2016-01-11 DIAGNOSIS — H6123 Impacted cerumen, bilateral: Secondary | ICD-10-CM

## 2016-01-11 DIAGNOSIS — R05 Cough: Secondary | ICD-10-CM

## 2016-01-11 DIAGNOSIS — J449 Chronic obstructive pulmonary disease, unspecified: Secondary | ICD-10-CM | POA: Diagnosis not present

## 2016-01-11 DIAGNOSIS — I517 Cardiomegaly: Secondary | ICD-10-CM | POA: Diagnosis not present

## 2016-01-11 DIAGNOSIS — I2510 Atherosclerotic heart disease of native coronary artery without angina pectoris: Secondary | ICD-10-CM

## 2016-01-11 DIAGNOSIS — Z974 Presence of external hearing-aid: Secondary | ICD-10-CM | POA: Diagnosis not present

## 2016-01-11 MED ORDER — PREDNISONE 50 MG PO TABS: ORAL_TABLET | ORAL | 0 refills | Status: DC

## 2016-01-11 MED ORDER — DOXYCYCLINE HYCLATE 100 MG PO TABS: 100.0000 mg | ORAL_TABLET | Freq: Two times a day (BID) | ORAL | 0 refills | Status: AC

## 2016-01-11 MED ORDER — TICAGRELOR 90 MG PO TABS: 90.0000 mg | ORAL_TABLET | Freq: Two times a day (BID) | ORAL | 0 refills | Status: DC

## 2016-01-11 NOTE — Assessment & Plan Note (Signed)
With bilateral cerumen impactions. Irrigation per nurse. Return as needed for this.

## 2016-01-11 NOTE — Assessment & Plan Note (Addendum)
Most likely consistent with a COPD exacerbation, though has not yet had spirometry. Continue maintenance inhalers, adding doxycycline, prednisone. Chest x-ray.  Return to see PCP if no better in a week.

## 2016-01-11 NOTE — Progress Notes (Signed)
  Subjective:    CC: Coughing  HPI: This is a pleasant 83 year old male, for the past several weeks he's had increasing cough with mild shortness of breath, wheeze, productive ofa. Mild runny nose, sore throat, no muscle aches, body aches, no constitutional symptoms. Symptoms are mild, persistent.  Past medical history:  Negative.  See flowsheet/record as well for more information.  Surgical history: Negative.  See flowsheet/record as well for more information.  Family history: Negative.  See flowsheet/record as well for more information.  Social history: Negative.  See flowsheet/record as well for more information.  Allergies, and medications have been entered into the medical record, reviewed, and no changes needed.   Review of Systems: No fevers, chills, night sweats, weight loss, chest pain, or shortness of breath.   Objective:    General: Well Developed, well nourished, and in no acute distress.  Neuro: Alert and oriented x3, extra-ocular muscles intact, sensation grossly intact.  HEENT: Normocephalic, atraumatic, pupils equal round reactive to light, neck supple, no masses, no lymphadenopathy, thyroid nonpalpable. Oropharynx, nasopharynx unremarkable, bilateral cerumen impactions. Skin: Warm and dry, no rashes. Cardiac: Regular rate and rhythm, no murmurs rubs or gallops, no lower extremity edema.  Respiratory: Breath sounds are coarse throughout, expiratory wheezes throughout. Not using accessory muscles, speaking in full sentences.  Indication: Cerumen impaction of the ear(s) Medical necessity statement: On physical examination, cerumen impairs clinically significant portions of the external auditory canal, and tympanic membrane.  Consent: Discussed benefits and risks of procedure and verbal consent obtained Procedure: Patient was prepped for the procedure. Utilized an otoscope to assess and take note of the ear canal, the tympanic membrane, and the presence, amount, and placement  of the cerumen. Gentle water irrigation was utilized to remove cerumen.  Post procedure examination: shows cerumen was completely removed. Patient tolerated procedure well. The patient is made aware that they may experience temporary vertigo, temporary hearing loss, and temporary discomfort. If these symptom last for more than 24 hours to call the clinic or proceed to the ED.  Impression and Recommendations:    Cough Most likely consistent with a COPD exacerbation, though has not yet had spirometry. Continue maintenance inhalers, adding doxycycline, prednisone. Chest x-ray.  Return to see PCP if no better in a week.   Wears hearing aid With bilateral cerumen impactions. Irrigation per nurse. Return as needed for this.  I spent 25 minutes with this patient, greater than 50% was face-to-face time counseling regarding the above diagnoses

## 2016-01-12 NOTE — Telephone Encounter (Signed)
Refill of what?  

## 2016-01-13 MED ORDER — BENZONATATE 200 MG PO CAPS: 200.0000 mg | ORAL_CAPSULE | Freq: Three times a day (TID) | ORAL | 0 refills | Status: DC | PRN

## 2016-01-25 ENCOUNTER — Encounter: Payer: Self-pay | Admitting: Family Medicine

## 2016-01-25 ENCOUNTER — Encounter (INDEPENDENT_AMBULATORY_CARE_PROVIDER_SITE_OTHER): Payer: Self-pay

## 2016-01-25 ENCOUNTER — Ambulatory Visit (INDEPENDENT_AMBULATORY_CARE_PROVIDER_SITE_OTHER): Payer: Medicare Other | Admitting: Family Medicine

## 2016-01-25 VITALS — BP 146/59 | HR 56 | Wt 209.0 lb

## 2016-01-25 DIAGNOSIS — R972 Elevated prostate specific antigen [PSA]: Secondary | ICD-10-CM

## 2016-01-25 DIAGNOSIS — N183 Chronic kidney disease, stage 3 unspecified: Secondary | ICD-10-CM

## 2016-01-25 DIAGNOSIS — I10 Essential (primary) hypertension: Secondary | ICD-10-CM

## 2016-01-25 DIAGNOSIS — I2510 Atherosclerotic heart disease of native coronary artery without angina pectoris: Secondary | ICD-10-CM

## 2016-01-25 DIAGNOSIS — N400 Enlarged prostate without lower urinary tract symptoms: Secondary | ICD-10-CM | POA: Diagnosis not present

## 2016-01-25 NOTE — Patient Instructions (Signed)
Thank you for coming in today. Get labs soon.  Let me know if you do not hear about the counseling or memory doctor.  Get fasting labs.   Check your blood pressure when feeling bad for the next few days.   After a week or so try stopping Doxazosin for a few days.   Call or go to the emergency room if you get worse, have trouble breathing, have chest pains, or palpitations.

## 2016-01-25 NOTE — Progress Notes (Signed)
Carlos Harrington is a 83 y.o. male who presents to [place]: [place] today for follow-up mild cognitive impairment, depression, hypertension.   In the interim patient has been seen by neurology who diagnosed mild cognitive impairment likely due to early vascular dementia.  They recommend neuropsychological testing as well as psychotherapy. He currently feels as though he is doing well with Zoloft. He would like to avoid more antidepression medications. The neurologist recommends against further medications for dementia at this time until further testing is done.  Hypertension: Patient has hypertension and coronary artery disease and heart failure controlled by the below medications. He notes that in the morning sometimes he feels jittery and fatigued. He does not check his blood pressure at these times. Otherwise he denies chest pain palpitations or shortness of breath.  BPH: Patient has BPH that's currently managed with doxazosin. He has tried stopping this medication in the past and had some problems with urinary retention. He can't remember if he's ever tried Flomax.  Past Medical History:  Diagnosis Date  . Anxiety   . CAD (coronary artery disease)    with stenting X 4 in [place]  . Depression   . Hyperlipidemia   . Hypertension   . Macular degeneration   . Mild cognitive impairment 01/07/2016   Past Surgical History:  Procedure Laterality Date  . APPENDECTOMY    . CHOLECYSTECTOMY    . TONSILLECTOMY     Social History  Substance Use Topics  . Smoking status: Never Smoker  . Smokeless tobacco: Not on file  . Alcohol use Yes     Comment: 2 daily   family history includes Depression in his mother; Diabetes in his mother; Hyperlipidemia in his mother; Hypertension in his mother; Other in his brother, mother, and sister.  ROS as above:  Medications: Current Outpatient  Prescriptions  Medication Sig Dispense Refill  . albuterol (PROVENTIL HFA;VENTOLIN HFA) 108 (90 BASE) MCG/ACT inhaler Inhale 1-2 puffs into the lungs every 6 (six) hours as needed for wheezing or shortness of breath. 1 Inhaler 0  . AMBULATORY NON FORMULARY MEDICATION Stair Lift at home for use as needed. 1 each 0  . amLODipine (NORVASC) 5 MG tablet Take 1 tablet (5 mg total) by mouth daily. 90 tablet 1  . aspirin 81 MG EC tablet TAKE ONE TABLET BY MOUTH EVERY DAY 28 tablet 0  . atorvastatin (LIPITOR) 80 MG tablet Take 1 tablet (80 mg total) by mouth daily.    . benzonatate (TESSALON) 200 MG capsule Take 1 capsule (200 mg total) by mouth 3 (three) times daily as needed for cough. 45 capsule 0  . budesonide-formoterol (SYMBICORT) 160-4.5 MCG/ACT inhaler Inhale 1 puff into the lungs 2 (two) times daily. 1 Inhaler 3  . carvedilol (COREG) 6.25 MG tablet Take 1 tablet (6.25 mg total) by mouth 2 (two) times daily. 180 tablet 1  . clobetasol cream (TEMOVATE) [id number] % Apply 1 application topically 2 (two) times daily. 30 g 0  . diazepam (VALIUM) 5 MG tablet Take 0.5-1 tablets (2.5-5 mg total) by mouth every 12 (twelve) hours as needed for anxiety. Only under the supervision of a non-sedated companion. 15 tablet 0  . doxazosin (CARDURA) 4 MG tablet Take 1 tablet (4 mg total) by mouth at bedtime. 90 tablet 1  . furosemide (LASIX) 20 MG tablet Take 1 tablet (20 mg total) by mouth 2 (two) times daily. 180 tablet 1  . ipratropium-albuterol (DUONEB) 0.5-2.5 (3)  MG/3ML SOLN Take 3 mLs by nebulization every 4 (four) hours as needed (cough or wheezing). 360 mL 1  . polycarbophil (FIBERCON) 625 MG tablet One by mouth twice a day.    . predniSONE (DELTASONE) 50 MG tablet One tab PO daily for 5 days. 5 tablet 0  . sertraline (ZOLOFT) 50 MG tablet Take 1 tablet (50 mg total) by mouth daily. Please discontinue lexapro. 90 tablet 1  . spironolactone (ALDACTONE) 25 MG tablet     . ticagrelor (BRILINTA) 90 MG TABS tablet  Take 1 tablet (90 mg total) by mouth 2 (two) times daily. 180 tablet 0  . Tiotropium Bromide-Olodaterol (STIOLTO RESPIMAT) 2.5-2.5 MCG/ACT AERS Inhale 1 puff into the lungs daily. 1 Inhaler 0  . traMADol (ULTRAM) 50 MG tablet Take 1 tablet (50 mg total) by mouth every 8 (eight) hours as needed. 15 tablet 0  . triamcinolone cream (KENALOG) 0.1 % Apply topically 2 (two) times daily. 453.6 g 12  . vitamin B-12 (CYANOCOBALAMIN) 1000 MCG tablet TAKE ONE TABLET BY MOUTH EVERY DAY 28 tablet 1   No current facility-administered medications for this visit.    No Known Allergies  Health Maintenance Health Maintenance  Topic Date Due  . PNA vac Low Risk Adult (2 of 2 - PPSV23) 11/17/2015  . TETANUS/TDAP  07/26/2024  . INFLUENZA VACCINE  Completed  . ZOSTAVAX  Completed     Exam:  BP (!) 146/59   Pulse (!) 56   Wt 209 lb (94.8 kg)   BMI 32.73 kg/m  Gen: Well NAD HEENT: EOMI,  MMM Lungs: Normal work of breathing. CTABL Heart: RRR no MRG Abd: NABS, Soft. Nondistended, Nontender Exts: Brisk capillary refill, warm and well perfused.  Neuro: Alert and oriented. Hard of hearing. Psych:  Depression screen [facility] 2/9 01/25/2016 03/24/2013  Decreased Interest 0 0  Down, Depressed, Hopeless 0 0  PHQ - 2 Score 0 0  Altered sleeping 1 -  Tired, decreased energy 1 -  Change in appetite 0 -  Feeling bad or failure about yourself  0 -  Trouble concentrating 1 -  Moving slowly or fidgety/restless 0 -  Suicidal thoughts 0 -  PHQ-9 Score 3 -   GAD 7 : Generalized Anxiety Score 01/25/2016  Nervous, Anxious, on Edge 3  Control/stop worrying 0  Worry too much - different things 0  Trouble relaxing 1  Restless 0  Easily annoyed or irritable 1  Afraid - awful might happen 0  Total GAD 7 Score 5       No results found for this or any previous visit (from the past 72 hour(s)). No results found.    Assessment and Plan: 83 y.o. male with  Mild cognitive impairment with reactive depression.  Recommend psychotherapy. Additionally will continue Zoloft. Each canine seems to be pretty well controlled. Anticipate results of neurocognitive testing. We'll consider Aricept in the near future if patient has dementia that seems to be warranted.  Hypertension: Blood pressure seems to be well-controlled today. Patient's jitteriness may very well be hypotension. Recommend checking blood pressure in the morning. Additionally his jitteriness may be due to side effects of Doxil Zosyn. I recommend that he stop taking doxazosin for a day or 2 and see how he feels. He feels better but continues to have urinary retention problems he may benefit from Flomax  Check in a few months or sooner if needed.   No orders of the defined types were placed in this encounter.   Discussed warning  signs or symptoms. Please see discharge instructions. Patient expresses understanding.

## 2016-01-27 DIAGNOSIS — G4733 Obstructive sleep apnea (adult) (pediatric): Secondary | ICD-10-CM | POA: Diagnosis not present

## 2016-01-27 DIAGNOSIS — I10 Essential (primary) hypertension: Secondary | ICD-10-CM | POA: Diagnosis not present

## 2016-02-07 ENCOUNTER — Other Ambulatory Visit: Payer: Self-pay | Admitting: Family Medicine

## 2016-02-07 DIAGNOSIS — Z955 Presence of coronary angioplasty implant and graft: Secondary | ICD-10-CM | POA: Diagnosis not present

## 2016-02-07 DIAGNOSIS — I5032 Chronic diastolic (congestive) heart failure: Secondary | ICD-10-CM | POA: Diagnosis not present

## 2016-02-07 DIAGNOSIS — I252 Old myocardial infarction: Secondary | ICD-10-CM | POA: Diagnosis not present

## 2016-02-07 DIAGNOSIS — I10 Essential (primary) hypertension: Secondary | ICD-10-CM | POA: Diagnosis not present

## 2016-02-07 DIAGNOSIS — I2510 Atherosclerotic heart disease of native coronary artery without angina pectoris: Secondary | ICD-10-CM | POA: Diagnosis not present

## 2016-02-08 DIAGNOSIS — F4321 Adjustment disorder with depressed mood: Secondary | ICD-10-CM | POA: Diagnosis not present

## 2016-02-13 DIAGNOSIS — E785 Hyperlipidemia, unspecified: Secondary | ICD-10-CM | POA: Diagnosis not present

## 2016-02-13 DIAGNOSIS — R0602 Shortness of breath: Secondary | ICD-10-CM | POA: Diagnosis not present

## 2016-02-13 DIAGNOSIS — J189 Pneumonia, unspecified organism: Secondary | ICD-10-CM | POA: Diagnosis not present

## 2016-02-13 DIAGNOSIS — J441 Chronic obstructive pulmonary disease with (acute) exacerbation: Secondary | ICD-10-CM | POA: Diagnosis not present

## 2016-02-13 DIAGNOSIS — I2510 Atherosclerotic heart disease of native coronary artery without angina pectoris: Secondary | ICD-10-CM | POA: Diagnosis not present

## 2016-02-13 DIAGNOSIS — Z7982 Long term (current) use of aspirin: Secondary | ICD-10-CM | POA: Diagnosis not present

## 2016-02-13 DIAGNOSIS — M1990 Unspecified osteoarthritis, unspecified site: Secondary | ICD-10-CM | POA: Diagnosis not present

## 2016-02-13 DIAGNOSIS — Z8673 Personal history of transient ischemic attack (TIA), and cerebral infarction without residual deficits: Secondary | ICD-10-CM | POA: Diagnosis not present

## 2016-02-13 DIAGNOSIS — R008 Other abnormalities of heart beat: Secondary | ICD-10-CM | POA: Diagnosis not present

## 2016-02-13 DIAGNOSIS — J449 Chronic obstructive pulmonary disease, unspecified: Secondary | ICD-10-CM | POA: Diagnosis not present

## 2016-02-13 DIAGNOSIS — Z955 Presence of coronary angioplasty implant and graft: Secondary | ICD-10-CM | POA: Diagnosis not present

## 2016-02-13 DIAGNOSIS — I509 Heart failure, unspecified: Secondary | ICD-10-CM | POA: Diagnosis not present

## 2016-02-13 DIAGNOSIS — Z9621 Cochlear implant status: Secondary | ICD-10-CM | POA: Diagnosis not present

## 2016-02-13 DIAGNOSIS — J181 Lobar pneumonia, unspecified organism: Secondary | ICD-10-CM | POA: Diagnosis not present

## 2016-02-13 DIAGNOSIS — Z9089 Acquired absence of other organs: Secondary | ICD-10-CM | POA: Diagnosis not present

## 2016-02-13 DIAGNOSIS — I110 Hypertensive heart disease with heart failure: Secondary | ICD-10-CM | POA: Diagnosis not present

## 2016-02-13 DIAGNOSIS — Z79899 Other long term (current) drug therapy: Secondary | ICD-10-CM | POA: Diagnosis not present

## 2016-02-13 DIAGNOSIS — I252 Old myocardial infarction: Secondary | ICD-10-CM | POA: Diagnosis not present

## 2016-02-13 DIAGNOSIS — Z9049 Acquired absence of other specified parts of digestive tract: Secondary | ICD-10-CM | POA: Diagnosis not present

## 2016-02-13 DIAGNOSIS — I10 Essential (primary) hypertension: Secondary | ICD-10-CM | POA: Diagnosis not present

## 2016-02-13 DIAGNOSIS — I5032 Chronic diastolic (congestive) heart failure: Secondary | ICD-10-CM | POA: Diagnosis not present

## 2016-02-16 ENCOUNTER — Ambulatory Visit (INDEPENDENT_AMBULATORY_CARE_PROVIDER_SITE_OTHER): Payer: Medicare Other

## 2016-02-16 ENCOUNTER — Ambulatory Visit (INDEPENDENT_AMBULATORY_CARE_PROVIDER_SITE_OTHER): Payer: Medicare Other | Admitting: Family Medicine

## 2016-02-16 VITALS — BP 134/47 | HR 56 | Temp 97.7°F | Wt 210.0 lb

## 2016-02-16 DIAGNOSIS — R059 Cough, unspecified: Secondary | ICD-10-CM

## 2016-02-16 DIAGNOSIS — R0602 Shortness of breath: Secondary | ICD-10-CM | POA: Diagnosis not present

## 2016-02-16 DIAGNOSIS — J189 Pneumonia, unspecified organism: Secondary | ICD-10-CM

## 2016-02-16 DIAGNOSIS — J111 Influenza due to unidentified influenza virus with other respiratory manifestations: Secondary | ICD-10-CM | POA: Diagnosis not present

## 2016-02-16 DIAGNOSIS — I2510 Atherosclerotic heart disease of native coronary artery without angina pectoris: Secondary | ICD-10-CM

## 2016-02-16 DIAGNOSIS — R05 Cough: Secondary | ICD-10-CM

## 2016-02-16 DIAGNOSIS — I517 Cardiomegaly: Secondary | ICD-10-CM | POA: Diagnosis not present

## 2016-02-16 MED ORDER — PREDNISONE 10 MG (48) PO TBPK: ORAL_TABLET | Freq: Every day | ORAL | 0 refills | Status: DC

## 2016-02-16 NOTE — Patient Instructions (Signed)
Thank you for coming in today. Take the higher dose of prednisone for 12 days.  Return early next week.  Call or go to the emergency room if you get worse, have trouble breathing, have chest pains, or palpitations.

## 2016-02-16 NOTE — Progress Notes (Signed)
Carlos Harrington is a 83 y.o. male who presents to [facility] [place]: [place] today for pneumonia follow up.  He has had cough and shortness of breath for several weeks. He was initially treated with doxycycline, prednisone, and inhalers for a COPD exacerbation. This led to some improvement followed by worsening. He presented to the ED 3 days ago, where bibasilar interstitial infiltrates were noted on CXR and he was found to be flu positive. He received IV solumedrol was discharged with tamiflu, levaquin, prednisone taper, cough syrup, and albuterol nebs. Improved for a day on all the medicines, then had worsening shortness of breath and fatigue. No fever throughout. Has not had worsening leg swelling or requiring more pillows to sleep at night.    Past Medical History:  Diagnosis Date  . Anxiety   . CAD (coronary artery disease)    with stenting X 4 in [place]  . Depression   . Hyperlipidemia   . Hypertension   . Macular degeneration   . Mild cognitive impairment 01/07/2016   Past Surgical History:  Procedure Laterality Date  . APPENDECTOMY    . CHOLECYSTECTOMY    . TONSILLECTOMY     Social History  Substance Use Topics  . Smoking status: Never Smoker  . Smokeless tobacco: Not on file  . Alcohol use Yes     Comment: 2 daily   family history includes Depression in his mother; Diabetes in his mother; Hyperlipidemia in his mother; Hypertension in his mother; Other in his brother, mother, and sister.  ROS as above:  Medications: Current Outpatient Prescriptions  Medication Sig Dispense Refill  . albuterol (PROVENTIL HFA;VENTOLIN HFA) 108 (90 BASE) MCG/ACT inhaler Inhale 1-2 puffs into the lungs every 6 (six) hours as needed for wheezing or shortness of breath. 1 Inhaler 0  . AMBULATORY NON FORMULARY MEDICATION Stair Lift at home for use as needed. 1 each 0  . amLODipine (NORVASC) 5 MG  tablet Take 1 tablet (5 mg total) by mouth daily. 90 tablet 1  . aspirin 81 MG EC tablet TAKE ONE TABLET BY MOUTH EVERY DAY 28 tablet 0  . atorvastatin (LIPITOR) 80 MG tablet Take 1 tablet (80 mg total) by mouth daily.    . benzonatate (TESSALON) 200 MG capsule Take 1 capsule (200 mg total) by mouth 3 (three) times daily as needed for cough. 45 capsule 0  . budesonide-formoterol (SYMBICORT) 160-4.5 MCG/ACT inhaler Inhale 1 puff into the lungs 2 (two) times daily. 1 Inhaler 3  . carvedilol (COREG) 6.25 MG tablet Take 1 tablet (6.25 mg total) by mouth 2 (two) times daily. 180 tablet 1  . clobetasol cream (TEMOVATE) [id number] % Apply 1 application topically 2 (two) times daily. 30 g 0  . diazepam (VALIUM) 5 MG tablet Take 0.5-1 tablets (2.5-5 mg total) by mouth every 12 (twelve) hours as needed for anxiety. Only under the supervision of a non-sedated companion. 15 tablet 0  . doxazosin (CARDURA) 4 MG tablet Take 1 tablet (4 mg total) by mouth at bedtime. 90 tablet 1  . furosemide (LASIX) 20 MG tablet Take 1 tablet (20 mg total) by mouth 2 (two) times daily. 180 tablet 1  . ipratropium-albuterol (DUONEB) 0.5-2.5 (3) MG/3ML SOLN Take 3 mLs by nebulization every 4 (four) hours as needed (cough or wheezing). 360 mL 1  . polycarbophil (FIBERCON) 625 MG tablet One by mouth twice a day.    . predniSONE (DELTASONE) 50 MG tablet One tab PO  daily for 5 days. 5 tablet 0  . sertraline (ZOLOFT) 50 MG tablet Take 1 tablet (50 mg total) by mouth daily. Please discontinue lexapro. 90 tablet 1  . spironolactone (ALDACTONE) 25 MG tablet     . ticagrelor (BRILINTA) 90 MG TABS tablet Take 1 tablet (90 mg total) by mouth 2 (two) times daily. 180 tablet 0  . Tiotropium Bromide-Olodaterol (STIOLTO RESPIMAT) 2.5-2.5 MCG/ACT AERS Inhale 1 puff into the lungs daily. 1 Inhaler 0  . traMADol (ULTRAM) 50 MG tablet Take 1 tablet (50 mg total) by mouth every 8 (eight) hours as needed. 15 tablet 0  . triamcinolone cream (KENALOG) 0.1 %  Apply topically 2 (two) times daily. 453.6 g 12  . vitamin B-12 (CYANOCOBALAMIN) 1000 MCG tablet TAKE ONE TABLET BY MOUTH EVERY DAY 28 tablet 1   No current facility-administered medications for this visit.    No Known Allergies  Health Maintenance Health Maintenance  Topic Date Due  . PNA vac Low Risk Adult (2 of 2 - PPSV23) 11/17/2015  . TETANUS/TDAP  07/26/2024  . INFLUENZA VACCINE  Completed  . ZOSTAVAX  Completed     Exam:  BP (!) 134/47   Pulse (!) 56   Temp 97.7 F (36.5 C) (Oral)   Wt 210 lb (95.3 kg)   SpO2 95%   BMI 32.89 kg/m  Gen: Well NAD Nontoxic-appearing.  Patient is able to ambulate independently with no apparent dyspnea or great difficulty. HEENT: EOMI,  MMM Lungs: Normal work of breathing. Coarse breath sounds in bilateral bases; no wheezes, rales, or rhonchi.  Heart: Distant heart sounds Abd: NABS, Soft. Nondistended, Nontender Exts: Brisk capillary refill, warm and well perfused. Bilateral 1+ pitting edema.  CXR 02/16/16: No obvious new infiltrates or opacities concerning for pneumonia. Cardiomegaly present. There is no significant change from x-ray dated January 2018.  Formal radiology read pending.  No results found for this or any previous visit (from the past 72 hour(s)). No results found.  Assessment and Plan: 83 y.o. male with diastolic CHF, COPD, and CKD, recently flu positive who presents with worsening SOB despite recent treatment with antibiotics and steroids. He remains afebrile with mild bradycardia; otherwise stable with adequate O2 sats and breathing comfortably. Repeat CXR with no evidence of pneumonia.  Worsening symptoms likely due to recent flu (s/p Tamiflu) and quick taper of prednisone. He would likely benefit from a more gradual prednisone taper for his COPD. - Continue levaquin, tamiflu, prednisone taper, codeine cough syrup, albuterol nebs - Follow up next week  Orders Placed This Encounter  Procedures  . DG Chest 2 View     Order Specific Question:   Reason for exam:    Answer:   Cough, assess intra-thoracic pathology    Order Specific Question:   Preferred imaging location?    Answer:   [facility]    Meds ordered this encounter  Medications  . guaiFENesin-codeine (ROBITUSSIN AC) 100-10 MG/5ML syrup    Sig: Take by mouth.  . levofloxacin (LEVAQUIN) 500 MG tablet    Sig: Take by mouth.  . oseltamivir (TAMIFLU) 75 MG capsule    Sig: Take by mouth.  [name] albuterol (PROVENTIL) (2.5 MG/3ML) 0.083% nebulizer solution    Sig: Take 3 mLs (2.5 mg total) by nebulization every 6 (six) hours as needed for Wheezing.     Discussed warning signs or symptoms. Please see discharge instructions. Patient expresses understanding.

## 2016-02-17 ENCOUNTER — Telehealth: Payer: Self-pay

## 2016-02-17 NOTE — Telephone Encounter (Signed)
Carlos Harrington is going to run out of the Robitussin [facility]. He would like a refill. Please advise.

## 2016-02-18 MED ORDER — GUAIFENESIN-CODEINE 100-10 MG/5ML PO SYRP: 5.0000 mL | ORAL_SOLUTION | Freq: Three times a day (TID) | ORAL | 0 refills | Status: AC | PRN

## 2016-02-18 NOTE — Telephone Encounter (Signed)
Unable to leave a message, voicemail full. Faxed to [place].

## 2016-02-18 NOTE — Telephone Encounter (Signed)
Refill ordered now and will be faxed later today.

## 2016-02-21 NOTE — Telephone Encounter (Signed)
Patient did pick up prescription.  

## 2016-02-23 ENCOUNTER — Encounter: Payer: Self-pay | Admitting: Family Medicine

## 2016-02-23 ENCOUNTER — Ambulatory Visit (INDEPENDENT_AMBULATORY_CARE_PROVIDER_SITE_OTHER): Payer: Medicare Other

## 2016-02-23 ENCOUNTER — Ambulatory Visit (INDEPENDENT_AMBULATORY_CARE_PROVIDER_SITE_OTHER): Payer: Medicare Other | Admitting: Family Medicine

## 2016-02-23 VITALS — BP 119/57 | HR 65 | Temp 98.4°F | Wt 207.0 lb

## 2016-02-23 DIAGNOSIS — I2510 Atherosclerotic heart disease of native coronary artery without angina pectoris: Secondary | ICD-10-CM | POA: Diagnosis not present

## 2016-02-23 DIAGNOSIS — R911 Solitary pulmonary nodule: Secondary | ICD-10-CM

## 2016-02-23 DIAGNOSIS — R05 Cough: Secondary | ICD-10-CM | POA: Diagnosis not present

## 2016-02-23 DIAGNOSIS — R059 Cough, unspecified: Secondary | ICD-10-CM

## 2016-02-23 MED ORDER — ALBUTEROL SULFATE (2.5 MG/3ML) 0.083% IN NEBU: INHALATION_SOLUTION | RESPIRATORY_TRACT | 6 refills | Status: DC

## 2016-02-23 NOTE — Patient Instructions (Signed)
Thank you for coming in today. Use the Symbicort inhaler twice daily for 1 month.  Continue albuterol as needed.  Recheck in 1 month or sooner if needed.  Get xray today.

## 2016-02-23 NOTE — Progress Notes (Signed)
Carlos Harrington is a 83 y.o. male who presents to [facility]: [place] today for cough congestion wheezing. Patient presents today for scheduled 1 week follow-up after influenza diagnosis on February 14. He notes continued wheezing and coughing and his daughter thinks he is feeling and looking better than usual. He says he is about the same. He has not been using the Symbicort inhaler that was dispensed on the 14th. He has been using albuterol and nebulizer which helps temporarily. He has finished a course of prednisone ss well as a course of Levaquin.   Past Medical History:  Diagnosis Date  . Anxiety   . CAD (coronary artery disease)    with stenting X 4 in [place]  . Depression   . Hyperlipidemia   . Hypertension   . Macular degeneration   . Mild cognitive impairment 01/07/2016   Past Surgical History:  Procedure Laterality Date  . APPENDECTOMY    . CHOLECYSTECTOMY    . TONSILLECTOMY     Social History  Substance Use Topics  . Smoking status: Never Smoker  . Smokeless tobacco: Never Used  . Alcohol use Yes     Comment: 2 daily   family history includes Depression in his mother; Diabetes in his mother; Hyperlipidemia in his mother; Hypertension in his mother; Other in his brother, mother, and sister.  ROS as above:  Medications: Current Outpatient Prescriptions  Medication Sig Dispense Refill  . albuterol (PROVENTIL HFA;VENTOLIN HFA) 108 (90 BASE) MCG/ACT inhaler Inhale 1-2 puffs into the lungs every 6 (six) hours as needed for wheezing or shortness of breath. 1 Inhaler 0  . albuterol (PROVENTIL) (2.5 MG/3ML) 0.083% nebulizer solution Take 3 mLs (2.5 mg total) by nebulization every 6 (six) hours as needed for Wheezing. 75 mL 6  . AMBULATORY NON FORMULARY MEDICATION Stair Lift at home for use as needed. 1 each 0  . amLODipine (NORVASC) 5 MG tablet Take 1 tablet (5 mg total) by  mouth daily. 90 tablet 1  . aspirin 81 MG EC tablet TAKE ONE TABLET BY MOUTH EVERY DAY 28 tablet 0  . atorvastatin (LIPITOR) 80 MG tablet Take 1 tablet (80 mg total) by mouth daily.    . budesonide-formoterol (SYMBICORT) 160-4.5 MCG/ACT inhaler Inhale 1 puff into the lungs 2 (two) times daily. 1 Inhaler 3  . carvedilol (COREG) 6.25 MG tablet Take 1 tablet (6.25 mg total) by mouth 2 (two) times daily. 180 tablet 1  . clobetasol cream (TEMOVATE) [id number] % Apply 1 application topically 2 (two) times daily. 30 g 0  . diazepam (VALIUM) 5 MG tablet Take 0.5-1 tablets (2.5-5 mg total) by mouth every 12 (twelve) hours as needed for anxiety. Only under the supervision of a non-sedated companion. 15 tablet 0  . doxazosin (CARDURA) 4 MG tablet Take 1 tablet (4 mg total) by mouth at bedtime. 90 tablet 1  . furosemide (LASIX) 20 MG tablet Take 1 tablet (20 mg total) by mouth 2 (two) times daily. 180 tablet 1  . guaiFENesin-codeine (ROBITUSSIN AC) 100-10 MG/5ML syrup Take 5 mLs by mouth 3 (three) times daily as needed for cough. 236 mL 0  . ipratropium-albuterol (DUONEB) 0.5-2.5 (3) MG/3ML SOLN Take 3 mLs by nebulization every 4 (four) hours as needed (cough or wheezing). 360 mL 1  . polycarbophil (FIBERCON) 625 MG tablet One by mouth twice a day.    . sertraline (ZOLOFT) 50 MG tablet Take 1 tablet (50 mg total) by mouth  daily. Please discontinue lexapro. 90 tablet 1  . spironolactone (ALDACTONE) 25 MG tablet     . ticagrelor (BRILINTA) 90 MG TABS tablet Take 1 tablet (90 mg total) by mouth 2 (two) times daily. 180 tablet 0  . Tiotropium Bromide-Olodaterol (STIOLTO RESPIMAT) 2.5-2.5 MCG/ACT AERS Inhale 1 puff into the lungs daily. 1 Inhaler 0  . traMADol (ULTRAM) 50 MG tablet Take 1 tablet (50 mg total) by mouth every 8 (eight) hours as needed. 15 tablet 0  . triamcinolone cream (KENALOG) 0.1 % Apply topically 2 (two) times daily. 453.6 g 12  . vitamin B-12 (CYANOCOBALAMIN) 1000 MCG tablet TAKE ONE TABLET BY MOUTH  EVERY DAY 28 tablet 1   No current facility-administered medications for this visit.    No Known Allergies  Health Maintenance Health Maintenance  Topic Date Due  . PNA vac Low Risk Adult (2 of 2 - PPSV23) 11/17/2015  . TETANUS/TDAP  07/26/2024  . INFLUENZA VACCINE  Completed     Exam:  BP (!) 119/57   Pulse 65   Temp 98.4 F (36.9 C) (Oral)   Wt 207 lb (93.9 kg)   SpO2 96%   BMI 32.42 kg/m  Gen: Well NAD Nontoxic appearing HEENT: EOMI,  MMM Lungs: Normal work of breathing. Wheezing present bilaterally with prolonged expiratory phase Heart: RRR no MRG Abd: NABS, Soft. Nondistended, Nontender Exts: Brisk capillary refill, warm and well perfused.    No results found for this or any previous visit (from the past 72 hour(s)). Dg Chest 2 View  Result Date: 02/23/2016 CLINICAL DATA:  83 year old male with history of cough EXAM: CHEST  2 VIEW COMPARISON:  02/16/2016 FINDINGS: Cardiomediastinal silhouette unchanged. No evidence of central vascular congestion. No interlobular septal thickening. No confluent airspace disease. No pneumothorax or pleural effusion. Lateral view demonstrates nodule at the posterior lung base measuring 14 mm. IMPRESSION: No radiographic evidence of acute cardiopulmonary disease. **An incidental finding of potential clinical significance has been found. There is a lung nodule at the posterior lung base on the lateral view measuring 14 mm. Referral for pulmonary evaluation and chest CT recommended.** These results will be called to the ordering clinician or representative by the Radiologist Assistant, and communication documented in the PACS or zVision Dashboard. Electronically Signed   By: Corrie Mckusick D.O.   On: 02/23/2016 15:41      Assessment and Plan: 83 y.o. male with wheezing on exam following recent illness. Concerning for COPD or asthma bronchitis. Plan for trial of inhaled corticosteroid with Symbicort. Continue watchful waiting. Chest x-ray was  repeated today showing an incidental nodule which will be followed up with a CT scan likely in the near future.   Orders Placed This Encounter  Procedures  . DG Chest 2 View    Order Specific Question:   Reason for exam:    Answer:   Cough, assess intra-thoracic pathology    Order Specific Question:   Preferred imaging location?    Answer:   [facility]   Meds ordered this encounter  Medications  . albuterol (PROVENTIL) (2.5 MG/3ML) 0.083% nebulizer solution    Sig: Take 3 mLs (2.5 mg total) by nebulization every 6 (six) hours as needed for Wheezing.    Dispense:  75 mL    Refill:  6     Discussed warning signs or symptoms. Please see discharge instructions. Patient expresses understanding.

## 2016-02-24 ENCOUNTER — Telehealth: Payer: Self-pay | Admitting: Family Medicine

## 2016-02-24 ENCOUNTER — Ambulatory Visit (INDEPENDENT_AMBULATORY_CARE_PROVIDER_SITE_OTHER): Payer: Medicare Other

## 2016-02-24 DIAGNOSIS — I517 Cardiomegaly: Secondary | ICD-10-CM | POA: Diagnosis not present

## 2016-02-24 DIAGNOSIS — R911 Solitary pulmonary nodule: Secondary | ICD-10-CM | POA: Diagnosis not present

## 2016-02-24 DIAGNOSIS — J9809 Other diseases of bronchus, not elsewhere classified: Secondary | ICD-10-CM | POA: Diagnosis not present

## 2016-02-24 DIAGNOSIS — I700 Atherosclerosis of aorta: Secondary | ICD-10-CM

## 2016-02-24 DIAGNOSIS — R918 Other nonspecific abnormal finding of lung field: Secondary | ICD-10-CM | POA: Diagnosis not present

## 2016-02-24 NOTE — Telephone Encounter (Signed)
Pulmonary nodule seen on CXR.  Radiology recommends a chest CT scan to evaluate this.  We will arrange for it to be done in the near future.  Follow up after CT scan.   Creatinine at [facility] was 0.89 on Feb 11th 2018

## 2016-02-24 NOTE — Telephone Encounter (Signed)
Information discussed with Slozer,Loretta. Slozer,Loretta verbalized understanding.

## 2016-02-27 DIAGNOSIS — G4733 Obstructive sleep apnea (adult) (pediatric): Secondary | ICD-10-CM | POA: Diagnosis not present

## 2016-03-01 DIAGNOSIS — I10 Essential (primary) hypertension: Secondary | ICD-10-CM | POA: Diagnosis not present

## 2016-03-01 DIAGNOSIS — G4733 Obstructive sleep apnea (adult) (pediatric): Secondary | ICD-10-CM | POA: Diagnosis not present

## 2016-03-01 DIAGNOSIS — I252 Old myocardial infarction: Secondary | ICD-10-CM | POA: Diagnosis not present

## 2016-03-01 DIAGNOSIS — I2510 Atherosclerotic heart disease of native coronary artery without angina pectoris: Secondary | ICD-10-CM | POA: Diagnosis not present

## 2016-03-03 DIAGNOSIS — G4733 Obstructive sleep apnea (adult) (pediatric): Secondary | ICD-10-CM | POA: Diagnosis not present

## 2016-03-06 ENCOUNTER — Other Ambulatory Visit: Payer: Self-pay | Admitting: Family Medicine

## 2016-03-06 ENCOUNTER — Telehealth: Payer: Self-pay

## 2016-03-06 DIAGNOSIS — J209 Acute bronchitis, unspecified: Secondary | ICD-10-CM

## 2016-03-06 NOTE — Telephone Encounter (Signed)
Received and denied a rx request for SPIRIVA HANDIHALER 18 MCG inhalation capsule due to it not being on the pt medication list. Received a call from [facility] stating that Lynne Leader, MD had given a sample for this medication and questioning if the patient is to continue taking it. Reviewed OV notes and did not find anything mentioning SPIRIVA HANDIHALER 18 MCG. Please advise if and how pt should be taking this medication and write new rx if appropriate.

## 2016-03-07 ENCOUNTER — Other Ambulatory Visit: Payer: Self-pay

## 2016-03-07 ENCOUNTER — Other Ambulatory Visit: Payer: Self-pay | Admitting: Family Medicine

## 2016-03-07 DIAGNOSIS — J209 Acute bronchitis, unspecified: Secondary | ICD-10-CM

## 2016-03-07 DIAGNOSIS — F4321 Adjustment disorder with depressed mood: Secondary | ICD-10-CM | POA: Diagnosis not present

## 2016-03-07 MED ORDER — TIOTROPIUM BROMIDE-OLODATEROL 2.5-2.5 MCG/ACT IN AERS: 1.0000 | INHALATION_SPRAY | Freq: Every day | RESPIRATORY_TRACT | 2 refills | Status: DC

## 2016-03-07 MED ORDER — BUDESONIDE-FORMOTEROL FUMARATE 160-4.5 MCG/ACT IN AERO
1.0000 | INHALATION_SPRAY | Freq: Two times a day (BID) | RESPIRATORY_TRACT | 3 refills | Status: DC
Start: 2016-03-07 — End: 2016-03-09

## 2016-03-07 NOTE — Telephone Encounter (Signed)
I gave a sample of Symbicort to use.  I will refill that.  We can also use Spiriva at the same time.  Would you like me to send that in too?

## 2016-03-07 NOTE — Telephone Encounter (Signed)
Notified Research scientist (medical). She would like to follow the current recommendations with no changes. Refill of symbicort and Sitalto sent to pharmacy.

## 2016-03-09 ENCOUNTER — Other Ambulatory Visit: Payer: Self-pay

## 2016-03-09 ENCOUNTER — Other Ambulatory Visit: Payer: Self-pay | Admitting: Family Medicine

## 2016-03-09 DIAGNOSIS — E538 Deficiency of other specified B group vitamins: Secondary | ICD-10-CM

## 2016-03-09 DIAGNOSIS — J209 Acute bronchitis, unspecified: Secondary | ICD-10-CM

## 2016-03-09 MED ORDER — VITAMIN B-12 1000 MCG PO TABS: 1000.0000 ug | ORAL_TABLET | Freq: Every day | ORAL | 1 refills | Status: DC

## 2016-03-09 MED ORDER — BUDESONIDE-FORMOTEROL FUMARATE 160-4.5 MCG/ACT IN AERO: 1.0000 | INHALATION_SPRAY | Freq: Two times a day (BID) | RESPIRATORY_TRACT | 3 refills | Status: DC

## 2016-03-09 MED ORDER — TIOTROPIUM BROMIDE-OLODATEROL 2.5-2.5 MCG/ACT IN AERS: 1.0000 | INHALATION_SPRAY | Freq: Every day | RESPIRATORY_TRACT | 2 refills | Status: DC

## 2016-03-27 DIAGNOSIS — F4321 Adjustment disorder with depressed mood: Secondary | ICD-10-CM | POA: Diagnosis not present

## 2016-03-28 DIAGNOSIS — F320 Major depressive disorder, single episode, mild: Secondary | ICD-10-CM | POA: Diagnosis not present

## 2016-03-30 DIAGNOSIS — F320 Major depressive disorder, single episode, mild: Secondary | ICD-10-CM | POA: Diagnosis not present

## 2016-04-07 ENCOUNTER — Ambulatory Visit (INDEPENDENT_AMBULATORY_CARE_PROVIDER_SITE_OTHER): Payer: Medicare Other | Admitting: Family Medicine

## 2016-04-07 ENCOUNTER — Encounter: Payer: Self-pay | Admitting: Family Medicine

## 2016-04-07 VITALS — BP 137/62 | HR 57 | Temp 97.9°F | Wt 211.0 lb

## 2016-04-07 DIAGNOSIS — R059 Cough, unspecified: Secondary | ICD-10-CM

## 2016-04-07 DIAGNOSIS — R05 Cough: Secondary | ICD-10-CM | POA: Diagnosis not present

## 2016-04-07 DIAGNOSIS — I2510 Atherosclerotic heart disease of native coronary artery without angina pectoris: Secondary | ICD-10-CM

## 2016-04-07 MED ORDER — AZITHROMYCIN 250 MG PO TABS
250.0000 mg | ORAL_TABLET | Freq: Every day | ORAL | 0 refills | Status: DC
Start: 2016-04-07 — End: 2016-04-07

## 2016-04-07 MED ORDER — PREDNISONE 10 MG PO TABS: 30.0000 mg | ORAL_TABLET | Freq: Every day | ORAL | 0 refills | Status: DC

## 2016-04-07 MED ORDER — BENZONATATE 200 MG PO CAPS: 200.0000 mg | ORAL_CAPSULE | Freq: Three times a day (TID) | ORAL | 0 refills | Status: DC | PRN

## 2016-04-07 MED ORDER — AZITHROMYCIN 250 MG PO TABS: 250.0000 mg | ORAL_TABLET | Freq: Every day | ORAL | 0 refills | Status: DC

## 2016-04-07 NOTE — Progress Notes (Signed)
Carlos Harrington is a 83 y.o. male who presents to [place]: [place] today for cough. Patient notes a one-day history of cough associated with mild wheezing. He denies any shortness of breath. The cough is mildly productive. He's been using leftover Tessalon Perles that helped a bit. He denies vomiting or diarrhea. He continues to use his nebulizers and his inhalers at home they continue to help.   Past Medical History:  Diagnosis Date  . Anxiety   . CAD (coronary artery disease)    with stenting X 4 in [place]  . Depression   . Hyperlipidemia   . Hypertension   . Macular degeneration   . Mild cognitive impairment 01/07/2016   Past Surgical History:  Procedure Laterality Date  . APPENDECTOMY    . CHOLECYSTECTOMY    . TONSILLECTOMY     Social History  Substance Use Topics  . Smoking status: Never Smoker  . Smokeless tobacco: Never Used  . Alcohol use Yes     Comment: 2 daily   family history includes Depression in his mother; Diabetes in his mother; Hyperlipidemia in his mother; Hypertension in his mother; Other in his brother, mother, and sister.  ROS as above:  Medications: Current Outpatient Prescriptions  Medication Sig Dispense Refill  . albuterol (PROVENTIL HFA;VENTOLIN HFA) 108 (90 BASE) MCG/ACT inhaler Inhale 1-2 puffs into the lungs every 6 (six) hours as needed for wheezing or shortness of breath. 1 Inhaler 0  . albuterol (PROVENTIL) (2.5 MG/3ML) 0.083% nebulizer solution Take 3 mLs (2.5 mg total) by nebulization every 6 (six) hours as needed for Wheezing. 75 mL 6  . AMBULATORY NON FORMULARY MEDICATION Stair Lift at home for use as needed. 1 each 0  . amLODipine (NORVASC) 5 MG tablet Take 1 tablet (5 mg total) by mouth daily. 90 tablet 1  . aspirin 81 MG EC tablet TAKE ONE TABLET BY MOUTH EVERY DAY 28 tablet 2  . atorvastatin (LIPITOR) 80 MG tablet Take 1 tablet  (80 mg total) by mouth daily.    [name] azithromycin (ZITHROMAX) 250 MG tablet Take 1 tablet (250 mg total) by mouth daily. Take first 2 tablets together, then 1 every day until finished. 6 tablet 0  . benzonatate (TESSALON) 200 MG capsule Take 1 capsule (200 mg total) by mouth 3 (three) times daily as needed for cough. 45 capsule 0  . budesonide-formoterol (SYMBICORT) 160-4.5 MCG/ACT inhaler Inhale 1 puff into the lungs 2 (two) times daily. 1 Inhaler 3  . carvedilol (COREG) 6.25 MG tablet Take 1 tablet (6.25 mg total) by mouth 2 (two) times daily. 180 tablet 1  . clobetasol cream (TEMOVATE) 1.61 % Apply 1 application topically 2 (two) times daily. 30 g 0  . diazepam (VALIUM) 5 MG tablet Take 0.5-1 tablets (2.5-5 mg total) by mouth every 12 (twelve) hours as needed for anxiety. Only under the supervision of a non-sedated companion. 15 tablet 0  . doxazosin (CARDURA) 4 MG tablet Take 1 tablet (4 mg total) by mouth at bedtime. 90 tablet 1  . furosemide (LASIX) 20 MG tablet Take 1 tablet (20 mg total) by mouth 2 (two) times daily. 180 tablet 1  . ipratropium-albuterol (DUONEB) 0.5-2.5 (3) MG/3ML SOLN Take 3 mLs by nebulization every 4 (four) hours as needed (cough or wheezing). 360 mL 1  . polycarbophil (FIBERCON) 625 MG tablet One by mouth twice a day.    . predniSONE (DELTASONE) 10 MG tablet Take 3 tablets (  30 mg total) by mouth daily with breakfast. 15 tablet 0  . sertraline (ZOLOFT) 50 MG tablet Take 1 tablet (50 mg total) by mouth daily. Please discontinue lexapro. 90 tablet 1  . spironolactone (ALDACTONE) 25 MG tablet     . ticagrelor (BRILINTA) 90 MG TABS tablet Take 1 tablet (90 mg total) by mouth 2 (two) times daily. 180 tablet 0  . Tiotropium Bromide-Olodaterol (STIOLTO RESPIMAT) 2.5-2.5 MCG/ACT AERS Inhale 1 puff into the lungs daily. 1 Inhaler 2  . traMADol (ULTRAM) 50 MG tablet Take 1 tablet (50 mg total) by mouth every 8 (eight) hours as needed. 15 tablet 0  . triamcinolone cream (KENALOG) 0.1  % Apply topically 2 (two) times daily. 453.6 g 12  . vitamin B-12 (CYANOCOBALAMIN) 1000 MCG tablet Take one tablet daily 30 tablet 0   No current facility-administered medications for this visit.    No Known Allergies  Health Maintenance Health Maintenance  Topic Date Due  . PNA vac Low Risk Adult (2 of 2 - PPSV23) 11/17/2015  . INFLUENZA VACCINE  08/02/2016  . TETANUS/TDAP  07/26/2024     Exam:  BP 137/62   Pulse (!) 57   Temp 97.9 F (36.6 C) (Oral)   Wt 211 lb (95.7 kg)   SpO2 96%   BMI 33.05 kg/m   Gen: Well NAD nontoxic appearing HEENT: EOMI,  MMM Lungs: Normal work of breathing. Coarse breath sounds and prolonged expiratory phase and wheezing present bilaterally Heart: RRR no MRG Abd: NABS, Soft. Nondistended, Nontender Exts: Brisk capillary refill, warm and well perfused.    No results found for this or any previous visit (from the past 72 hour(s)). No results found.    Assessment and Plan: 83 y.o. male with COPD exacerbation. Plan to treat with prednisone and azithromycin as well as Tessalon Perles. Use existing tramadol as needed for severe cough suppression.   No orders of the defined types were placed in this encounter.  Meds ordered this encounter  Medications  . benzonatate (TESSALON) 200 MG capsule    Sig: Take 1 capsule (200 mg total) by mouth 3 (three) times daily as needed for cough.    Dispense:  45 capsule    Refill:  0  . predniSONE (DELTASONE) 10 MG tablet    Sig: Take 3 tablets (30 mg total) by mouth daily with breakfast.    Dispense:  15 tablet    Refill:  0  . azithromycin (ZITHROMAX) 250 MG tablet    Sig: Take 1 tablet (250 mg total) by mouth daily. Take first 2 tablets together, then 1 every day until finished.    Dispense:  6 tablet    Refill:  0     Discussed warning signs or symptoms. Please see discharge instructions. Patient expresses understanding.

## 2016-04-07 NOTE — Addendum Note (Signed)
Addended by: Darla Lesches T on: 04/07/2016 04:19 PM   Modules accepted: Orders

## 2016-04-07 NOTE — Patient Instructions (Addendum)
Thank you for coming in today. Continue inhalers.  Use tessalon pearles for cough as needed.  Take 1/2 to 1 tab of tramadol as needed very 6 hours for severe cough.  If not better take prednisone and azithromycin.   Please follow up in the near future to go over memory tests.    Chronic Obstructive Pulmonary Disease Exacerbation Chronic obstructive pulmonary disease (COPD) is a common lung problem. In COPD, the flow of air from the lungs is limited. COPD exacerbations are times that breathing gets worse and you need extra treatment. Without treatment they can be life threatening. If they happen often, your lungs can become more damaged. If your COPD gets worse, your doctor may treat you with:  Medicines.  Oxygen.  Different ways to clear your airway, such as using a mask. Follow these instructions at home:  Do not smoke.  Avoid tobacco smoke and other things that bother your lungs.  If given, take your antibiotic medicine as told. Finish the medicine even if you start to feel better.  Only take medicines as told by your doctor.  Drink enough fluids to keep your pee (urine) clear or pale yellow (unless your doctor has told you not to).  Use a cool mist machine (vaporizer).  If you use oxygen or a machine that turns liquid medicine into a mist (nebulizer), continue to use them as told.  Keep up with shots (vaccinations) as told by your doctor.  Exercise regularly.  Eat healthy foods.  Keep all doctor visits as told. Get help right away if:  You are very short of breath and it gets worse.  You have trouble talking.  You have bad chest pain.  You have blood in your spit (sputum).  You have a fever.  You keep throwing up (vomiting).  You feel weak, or you pass out (faint).  You feel confused.  You keep getting worse. This information is not intended to replace advice given to you by your health care provider. Make sure you discuss any questions you have with your  health care provider. Document Released: 12/08/2010 Document Revised: 05/27/2015 Document Reviewed: 08/23/2012 Elsevier Interactive Patient Education  2017 [employer].

## 2016-04-10 DIAGNOSIS — F4321 Adjustment disorder with depressed mood: Secondary | ICD-10-CM | POA: Diagnosis not present

## 2016-04-17 ENCOUNTER — Other Ambulatory Visit: Payer: Self-pay | Admitting: Family Medicine

## 2016-04-17 DIAGNOSIS — E538 Deficiency of other specified B group vitamins: Secondary | ICD-10-CM

## 2016-04-21 ENCOUNTER — Other Ambulatory Visit: Payer: Self-pay | Admitting: Family Medicine

## 2016-04-21 DIAGNOSIS — I2510 Atherosclerotic heart disease of native coronary artery without angina pectoris: Secondary | ICD-10-CM

## 2016-04-22 ENCOUNTER — Other Ambulatory Visit: Payer: Self-pay | Admitting: Family Medicine

## 2016-04-22 DIAGNOSIS — I2510 Atherosclerotic heart disease of native coronary artery without angina pectoris: Secondary | ICD-10-CM

## 2016-04-22 DIAGNOSIS — I10 Essential (primary) hypertension: Secondary | ICD-10-CM

## 2016-04-24 ENCOUNTER — Ambulatory Visit: Payer: Self-pay | Admitting: Family Medicine

## 2016-05-01 ENCOUNTER — Ambulatory Visit (INDEPENDENT_AMBULATORY_CARE_PROVIDER_SITE_OTHER): Payer: Medicare Other | Admitting: Family Medicine

## 2016-05-01 ENCOUNTER — Encounter: Payer: Self-pay | Admitting: Family Medicine

## 2016-05-01 VITALS — BP 139/52 | HR 51 | Wt 211.0 lb

## 2016-05-01 DIAGNOSIS — I2510 Atherosclerotic heart disease of native coronary artery without angina pectoris: Secondary | ICD-10-CM

## 2016-05-01 DIAGNOSIS — G4733 Obstructive sleep apnea (adult) (pediatric): Secondary | ICD-10-CM | POA: Diagnosis not present

## 2016-05-01 DIAGNOSIS — F321 Major depressive disorder, single episode, moderate: Secondary | ICD-10-CM | POA: Insufficient documentation

## 2016-05-01 DIAGNOSIS — R413 Other amnesia: Secondary | ICD-10-CM | POA: Diagnosis not present

## 2016-05-01 MED ORDER — AMBULATORY NON FORMULARY MEDICATION: 0 refills | Status: DC

## 2016-05-01 MED ORDER — SERTRALINE HCL 50 MG PO TABS: ORAL_TABLET | ORAL | 1 refills | Status: DC

## 2016-05-01 NOTE — Patient Instructions (Addendum)
Thank you for coming in today. You should hear about CPAP soon.  Start zoloft 1/2 pill by mouth daily for 2 weeks then increase to 1 pill daily.  Recheck in 1 month  Get fasting labs soon.    Sertraline tablets What is this medicine? SERTRALINE (SER tra leen) is used to treat depression. It may also be used to treat obsessive compulsive disorder, panic disorder, post-trauma stress, premenstrual dysphoric disorder (PMDD) or social anxiety. This medicine may be used for other purposes; ask your health care provider or pharmacist if you have questions. COMMON BRAND NAME(S): Zoloft What should I tell my health care provider before I take this medicine? They need to know if you have any of these conditions: -bleeding disorders -bipolar disorder or a family history of bipolar disorder -glaucoma -heart disease -high blood pressure -history of irregular heartbeat -history of low levels of calcium, magnesium, or potassium in the blood -if you often drink alcohol -liver disease -receiving electroconvulsive therapy -seizures -suicidal thoughts, plans, or attempt; a previous suicide attempt by you or a family member -take medicines that treat or prevent blood clots -thyroid disease -an unusual or allergic reaction to sertraline, other medicines, foods, dyes, or preservatives -pregnant or trying to get pregnant -breast-feeding How should I use this medicine? Take this medicine by mouth with a glass of water. Follow the directions on the prescription label. You can take it with or without food. Take your medicine at regular intervals. Do not take your medicine more often than directed. Do not stop taking this medicine suddenly except upon the advice of your doctor. Stopping this medicine too quickly may cause serious side effects or your condition may worsen. A special MedGuide will be given to you by the pharmacist with each prescription and refill. Be sure to read this information carefully each  time. Talk to your pediatrician regarding the use of this medicine in children. While this drug may be prescribed for children as young as 7 years for selected conditions, precautions do apply. Overdosage: If you think you have taken too much of this medicine contact a poison control center or emergency room at once. NOTE: This medicine is only for you. Do not share this medicine with others. What if I miss a dose? If you miss a dose, take it as soon as you can. If it is almost time for your next dose, take only that dose. Do not take double or extra doses. What may interact with this medicine? Do not take this medicine with any of the following medications: -cisapride -dofetilide -dronedarone -linezolid -MAOIs like Carbex, Eldepryl, Marplan, Nardil, and Parnate -methylene blue (injected into a vein) -pimozide -thioridazine This medicine may also interact with the following medications: -alcohol -amphetamines -aspirin and aspirin-like medicines -certain medicines for depression, anxiety, or psychotic disturbances -certain medicines for fungal infections like ketoconazole, fluconazole, posaconazole, and itraconazole -certain medicines for irregular heart beat like flecainide, quinidine, propafenone -certain medicines for migraine headaches like almotriptan, eletriptan, frovatriptan, naratriptan, rizatriptan, sumatriptan, zolmitriptan -certain medicines for sleep -certain medicines for seizures like carbamazepine, valproic acid, phenytoin -certain medicines that treat or prevent blood clots like warfarin, enoxaparin, dalteparin -cimetidine -digoxin -diuretics -fentanyl -isoniazid -lithium -NSAIDs, medicines for pain and inflammation, like ibuprofen or naproxen -other medicines that prolong the QT interval (cause an abnormal heart rhythm) -rasagiline -safinamide -supplements like St. John's wort, kava kava, valerian -tolbutamide -tramadol -tryptophan This list may not describe  all possible interactions. Give your health care provider a list of all the medicines,  herbs, non-prescription drugs, or dietary supplements you use. Also tell them if you smoke, drink alcohol, or use illegal drugs. Some items may interact with your medicine. What should I watch for while using this medicine? Tell your doctor if your symptoms do not get better or if they get worse. Visit your doctor or health care professional for regular checks on your progress. Because it may take several weeks to see the full effects of this medicine, it is important to continue your treatment as prescribed by your doctor. Patients and their families should watch out for new or worsening thoughts of suicide or depression. Also watch out for sudden changes in feelings such as feeling anxious, agitated, panicky, irritable, hostile, aggressive, impulsive, severely restless, overly excited and hyperactive, or not being able to sleep. If this happens, especially at the beginning of treatment or after a change in dose, call your health care professional. Dennis Bast may get drowsy or dizzy. Do not drive, use machinery, or do anything that needs mental alertness until you know how this medicine affects you. Do not stand or sit up quickly, especially if you are an older patient. This reduces the risk of dizzy or fainting spells. Alcohol may interfere with the effect of this medicine. Avoid alcoholic drinks. Your mouth may get dry. Chewing sugarless gum or sucking hard candy, and drinking plenty of water may help. Contact your doctor if the problem does not go away or is severe. What side effects may I notice from receiving this medicine? Side effects that you should report to your doctor or health care professional as soon as possible: -allergic reactions like skin rash, itching or hives, swelling of the face, lips, or tongue -anxious -black, tarry stools -changes in vision -confusion -elevated mood, decreased need for sleep, racing  thoughts, impulsive behavior -eye pain -fast, irregular heartbeat -feeling faint or lightheaded, falls -feeling agitated, angry, or irritable -hallucination, loss of contact with reality -loss of balance or coordination -loss of memory -painful or prolonged erections -restlessness, pacing, inability to keep still -seizures -stiff muscles -suicidal thoughts or other mood changes -trouble sleeping -unusual bleeding or bruising -unusually weak or tired -vomiting Side effects that usually do not require medical attention (report to your doctor or health care professional if they continue or are bothersome): -change in appetite or weight -change in sex drive or performance -diarrhea -increased sweating -indigestion, nausea -tremors This list may not describe all possible side effects. Call your doctor for medical advice about side effects. You may report side effects to FDA at 1-800-FDA-1088. Where should I keep my medicine? Keep out of the reach of children. Store at room temperature between 15 and 30 degrees C (59 and 86 degrees F). Throw away any unused medicine after the expiration date. NOTE: This sheet is a summary. It may not cover all possible information. If you have questions about this medicine, talk to your doctor, pharmacist, or health care provider.  2018 Elsevier/Gold Standard (2015-12-24 14:17:49)

## 2016-05-01 NOTE — Progress Notes (Signed)
Axiel Fjeld is a 83 y.o. male who presents to [facility]: [place] today for follow up depression and sleep apnea.   Lukah had neuropsychological testing recently which showed major depression as a cause for his memory problems. He is not currently taking any antidepression medications. He denies any SI or HI and feels reasonably well otherwise. He is interested in medications if possible.  Additionally this point his workup he was found to have sleep apnea. He felt that he was supposed to get a CPAP machine prescribed but he has never heard about that yet.   Past Medical History:  Diagnosis Date  . Anxiety   . CAD (coronary artery disease)    with stenting X 4 in [place]  . Depression   . Hyperlipidemia   . Hypertension   . Macular degeneration   . Mild cognitive impairment 01/07/2016   Past Surgical History:  Procedure Laterality Date  . APPENDECTOMY    . CHOLECYSTECTOMY    . TONSILLECTOMY     Social History  Substance Use Topics  . Smoking status: Never Smoker  . Smokeless tobacco: Never Used  . Alcohol use Yes     Comment: 2 daily   family history includes Depression in his mother; Diabetes in his mother; Hyperlipidemia in his mother; Hypertension in his mother; Other in his brother, mother, and sister.  ROS as above:  Medications: Current Outpatient Prescriptions  Medication Sig Dispense Refill  . albuterol (PROVENTIL HFA;VENTOLIN HFA) 108 (90 BASE) MCG/ACT inhaler Inhale 1-2 puffs into the lungs every 6 (six) hours as needed for wheezing or shortness of breath. 1 Inhaler 0  . albuterol (PROVENTIL) (2.5 MG/3ML) 0.083% nebulizer solution Take 3 mLs (2.5 mg total) by nebulization every 6 (six) hours as needed for Wheezing. 75 mL 6  . AMBULATORY NON FORMULARY MEDICATION Stair Lift at home for use as needed. 1 each 0  . amLODipine (NORVASC) 5 MG tablet Take 1 tablet (5  mg total) by mouth daily. 90 tablet 1  . aspirin 81 MG EC tablet TAKE ONE TABLET BY MOUTH EVERY DAY 28 tablet 2  . atorvastatin (LIPITOR) 80 MG tablet Take 1 tablet (80 mg total) by mouth daily.    [name] BRILINTA 90 MG TABS tablet TAKE ONE TABLET BY MOUTH 2 TIMES A DAY 56 tablet 0  . budesonide-formoterol (SYMBICORT) 160-4.5 MCG/ACT inhaler Inhale 1 puff into the lungs 2 (two) times daily. 1 Inhaler 3  . carvedilol (COREG) 6.25 MG tablet Take 1 tablet (6.25 mg total) by mouth 2 (two) times daily. 180 tablet 1  . clobetasol cream (TEMOVATE) 1.61 % Apply 1 application topically 2 (two) times daily. 30 g 0  . diazepam (VALIUM) 5 MG tablet Take 0.5-1 tablets (2.5-5 mg total) by mouth every 12 (twelve) hours as needed for anxiety. Only under the supervision of a non-sedated companion. 15 tablet 0  . doxazosin (CARDURA) 4 MG tablet TAKE ONE TABLET BY MOUTH AT BEDTIME 28 tablet 0  . furosemide (LASIX) 20 MG tablet Take 1 tablet (20 mg total) by mouth 2 (two) times daily. 180 tablet 1  . ipratropium-albuterol (DUONEB) 0.5-2.5 (3) MG/3ML SOLN Take 3 mLs by nebulization every 4 (four) hours as needed (cough or wheezing). 360 mL 1  . polycarbophil (FIBERCON) 625 MG tablet One by mouth twice a day.    . predniSONE (DELTASONE) 10 MG tablet Take 3 tablets (30 mg total) by mouth daily with breakfast. 15 tablet  0  . sertraline (ZOLOFT) 50 MG tablet Take 1/2 pill po daily x 2 weeks then increase to 1 pill. 30 tablet 1  . spironolactone (ALDACTONE) 25 MG tablet     . Tiotropium Bromide-Olodaterol (STIOLTO RESPIMAT) 2.5-2.5 MCG/ACT AERS Inhale 1 puff into the lungs daily. 1 Inhaler 2  . traMADol (ULTRAM) 50 MG tablet Take 1 tablet (50 mg total) by mouth every 8 (eight) hours as needed. 15 tablet 0  . triamcinolone cream (KENALOG) 0.1 % Apply topically 2 (two) times daily. 453.6 g 12  . vitamin B-12 (CYANOCOBALAMIN) 1000 MCG tablet TAKE ONE TABLET BY MOUTH EVERY DAY 28 tablet 0  . AMBULATORY NON FORMULARY MEDICATION  Continuous positive airway pressure (CPAP) machine auto-titrate from 4-20 cm of H2O pressure, with all supplemental supplies as needed. 1 each 0   No current facility-administered medications for this visit.    No Known Allergies  Health Maintenance Health Maintenance  Topic Date Due  . PNA vac Low Risk Adult (2 of 2 - PPSV23) 11/17/2015  . INFLUENZA VACCINE  08/02/2016  . TETANUS/TDAP  07/26/2024     Exam:  BP (!) 139/52   Pulse (!) 51   Wt 211 lb (95.7 kg)   BMI 33.05 kg/m  Gen: Well NAD HEENT: EOMI,  MMM Lungs: Normal work of breathing. CTABL Heart: RRR no MRG Abd: NABS, Soft. Nondistended, Nontender Exts: Brisk capillary refill, warm and well perfused.  Neuro/Psych: Alert and oriented decreased hearing normal speech and thought process. No SI or HI expressed.   No results found for this or any previous visit (from the past 72 hour(s)). No results found.    Assessment and Plan: 83 y.o. male with  Depression. Start sertraline 25 mg daily. Will slowly titrate 200 mg. Follow-up in about one month.  Sleep apnea new diagnosis. CPAP ordered. Recheck in about a month.   No orders of the defined types were placed in this encounter.  Meds ordered this encounter  Medications  . sertraline (ZOLOFT) 50 MG tablet    Sig: Take 1/2 pill po daily x 2 weeks then increase to 1 pill.    Dispense:  30 tablet    Refill:  1  . AMBULATORY NON FORMULARY MEDICATION    Sig: Continuous positive airway pressure (CPAP) machine auto-titrate from 4-20 cm of H2O pressure, with all supplemental supplies as needed.    Dispense:  1 each    Refill:  0     Discussed warning signs or symptoms. Please see discharge instructions. Patient expresses understanding.

## 2016-05-03 DIAGNOSIS — F4321 Adjustment disorder with depressed mood: Secondary | ICD-10-CM | POA: Diagnosis not present

## 2016-05-22 DIAGNOSIS — F4321 Adjustment disorder with depressed mood: Secondary | ICD-10-CM | POA: Diagnosis not present

## 2016-05-25 ENCOUNTER — Other Ambulatory Visit: Payer: Self-pay

## 2016-05-25 DIAGNOSIS — G4733 Obstructive sleep apnea (adult) (pediatric): Secondary | ICD-10-CM

## 2016-05-25 MED ORDER — AMBULATORY NON FORMULARY MEDICATION: 0 refills | Status: AC

## 2016-05-26 ENCOUNTER — Other Ambulatory Visit: Payer: Self-pay | Admitting: Family Medicine

## 2016-05-26 DIAGNOSIS — I10 Essential (primary) hypertension: Secondary | ICD-10-CM

## 2016-05-26 DIAGNOSIS — E538 Deficiency of other specified B group vitamins: Secondary | ICD-10-CM

## 2016-05-26 DIAGNOSIS — I2510 Atherosclerotic heart disease of native coronary artery without angina pectoris: Secondary | ICD-10-CM

## 2016-05-30 ENCOUNTER — Ambulatory Visit: Payer: Medicare Other | Admitting: Family Medicine

## 2016-06-06 DIAGNOSIS — I519 Heart disease, unspecified: Secondary | ICD-10-CM | POA: Diagnosis not present

## 2016-06-06 DIAGNOSIS — N183 Chronic kidney disease, stage 3 (moderate): Secondary | ICD-10-CM | POA: Diagnosis not present

## 2016-06-06 DIAGNOSIS — G4733 Obstructive sleep apnea (adult) (pediatric): Secondary | ICD-10-CM | POA: Diagnosis not present

## 2016-06-06 DIAGNOSIS — I10 Essential (primary) hypertension: Secondary | ICD-10-CM | POA: Diagnosis not present

## 2016-06-06 DIAGNOSIS — I252 Old myocardial infarction: Secondary | ICD-10-CM | POA: Diagnosis not present

## 2016-06-06 DIAGNOSIS — I2510 Atherosclerotic heart disease of native coronary artery without angina pectoris: Secondary | ICD-10-CM | POA: Diagnosis not present

## 2016-06-06 DIAGNOSIS — Z955 Presence of coronary angioplasty implant and graft: Secondary | ICD-10-CM | POA: Diagnosis not present

## 2016-06-06 DIAGNOSIS — I517 Cardiomegaly: Secondary | ICD-10-CM | POA: Diagnosis not present

## 2016-06-06 DIAGNOSIS — I5032 Chronic diastolic (congestive) heart failure: Secondary | ICD-10-CM | POA: Diagnosis not present

## 2016-06-19 ENCOUNTER — Other Ambulatory Visit: Payer: Self-pay | Admitting: Family Medicine

## 2016-06-19 DIAGNOSIS — I10 Essential (primary) hypertension: Secondary | ICD-10-CM

## 2016-06-19 DIAGNOSIS — E538 Deficiency of other specified B group vitamins: Secondary | ICD-10-CM

## 2016-06-19 DIAGNOSIS — I2510 Atherosclerotic heart disease of native coronary artery without angina pectoris: Secondary | ICD-10-CM

## 2016-06-21 DIAGNOSIS — H43813 Vitreous degeneration, bilateral: Secondary | ICD-10-CM | POA: Diagnosis not present

## 2016-06-21 DIAGNOSIS — H527 Unspecified disorder of refraction: Secondary | ICD-10-CM | POA: Diagnosis not present

## 2016-06-21 DIAGNOSIS — Z961 Presence of intraocular lens: Secondary | ICD-10-CM | POA: Diagnosis not present

## 2016-07-11 ENCOUNTER — Encounter: Payer: Self-pay | Admitting: Family Medicine

## 2016-07-11 ENCOUNTER — Ambulatory Visit (INDEPENDENT_AMBULATORY_CARE_PROVIDER_SITE_OTHER): Payer: Medicare Other | Admitting: Family Medicine

## 2016-07-11 VITALS — BP 137/52 | HR 70 | Wt 212.0 lb

## 2016-07-11 DIAGNOSIS — L299 Pruritus, unspecified: Secondary | ICD-10-CM | POA: Diagnosis not present

## 2016-07-11 DIAGNOSIS — R21 Rash and other nonspecific skin eruption: Secondary | ICD-10-CM | POA: Diagnosis not present

## 2016-07-11 DIAGNOSIS — I2510 Atherosclerotic heart disease of native coronary artery without angina pectoris: Secondary | ICD-10-CM | POA: Diagnosis not present

## 2016-07-11 LAB — CBC
HEMATOCRIT: 44.2 % (ref 38.5–50.0)
HEMOGLOBIN: 14.9 g/dL (ref 13.2–17.1)
MCH: 31.4 pg (ref 27.0–33.0)
MCHC: 33.7 g/dL (ref 32.0–36.0)
MCV: 93.2 fL (ref 80.0–100.0)
MPV: 8.7 fL (ref 7.5–12.5)
Platelets: 176 10*3/uL (ref 140–400)
RBC: 4.74 MIL/uL (ref 4.20–5.80)
RDW: 13.1 % (ref 11.0–15.0)
WBC: 6.9 10*3/uL (ref 3.8–10.8)

## 2016-07-11 MED ORDER — PERMETHRIN 5 % EX CREA: 1.0000 "application " | TOPICAL_CREAM | Freq: Once | CUTANEOUS | 3 refills | Status: AC

## 2016-07-11 MED ORDER — TRIAMCINOLONE ACETONIDE 0.5 % EX CREA: 1.0000 "application " | TOPICAL_CREAM | Freq: Two times a day (BID) | CUTANEOUS | 3 refills | Status: DC

## 2016-07-11 MED ORDER — CETIRIZINE HCL 10 MG PO TABS: 10.0000 mg | ORAL_TABLET | Freq: Every day | ORAL | 1 refills | Status: AC

## 2016-07-11 NOTE — Progress Notes (Signed)
Carlos Harrington is a 83 y.o. male who presents to [facility]: [place] today for itching and rash. Patient has a 4 week history of itching and rash. He notes that he is starting to excoriate his skin. He has tried some Benadryl which helps a little. He thinks possibly the Coreg blood pressure medicine may be a cause. He denies any new symptoms of detergents or shampoos. No one else has a similar rash.   Past Medical History:  Diagnosis Date  . Anxiety   . CAD (coronary artery disease)    with stenting X 4 in [place]  . Depression   . Hyperlipidemia   . Hypertension   . Macular degeneration   . Mild cognitive impairment 01/07/2016   Past Surgical History:  Procedure Laterality Date  . APPENDECTOMY    . CHOLECYSTECTOMY    . TONSILLECTOMY     Social History  Substance Use Topics  . Smoking status: Never Smoker  . Smokeless tobacco: Never Used  . Alcohol use Yes     Comment: 2 daily   family history includes Depression in his mother; Diabetes in his mother; Hyperlipidemia in his mother; Hypertension in his mother; Other in his brother, mother, and sister.  ROS as above:  Medications: Current Outpatient Prescriptions  Medication Sig Dispense Refill  . albuterol (PROVENTIL HFA;VENTOLIN HFA) 108 (90 BASE) MCG/ACT inhaler Inhale 1-2 puffs into the lungs every 6 (six) hours as needed for wheezing or shortness of breath. 1 Inhaler 0  . albuterol (PROVENTIL) (2.5 MG/3ML) 0.083% nebulizer solution Take 3 mLs (2.5 mg total) by nebulization every 6 (six) hours as needed for Wheezing. 75 mL 6  . AMBULATORY NON FORMULARY MEDICATION Stair Lift at home for use as needed. 1 each 0  . AMBULATORY NON FORMULARY MEDICATION Continuous positive airway pressure (CPAP) machine auto-titrate from 4-20 cm of H2O pressure, with all supplemental supplies as needed. 1 each 0  . amLODipine (NORVASC) 5 MG  tablet TAKE ONE TABLET BY MOUTH EVERY DAY 28 tablet 0  . aspirin 81 MG EC tablet TAKE ONE TABLET BY MOUTH EVERY DAY 30 tablet 0  . atorvastatin (LIPITOR) 80 MG tablet Take 1 tablet (80 mg total) by mouth daily.    [name] BRILINTA 90 MG TABS tablet TAKE ONE TABLET BY MOUTH 2 TIMES A DAY 56 tablet 0  . budesonide-formoterol (SYMBICORT) 160-4.5 MCG/ACT inhaler Inhale 1 puff into the lungs 2 (two) times daily. 1 Inhaler 3  . carvedilol (COREG) 6.25 MG tablet Take 1 tablet (6.25 mg total) by mouth 2 (two) times daily. 180 tablet 1  . cetirizine (ZYRTEC) 10 MG tablet Take 1 tablet (10 mg total) by mouth daily. 90 tablet 1  . clobetasol cream (TEMOVATE) 0.78 % Apply 1 application topically 2 (two) times daily. 30 g 0  . diazepam (VALIUM) 5 MG tablet Take 0.5-1 tablets (2.5-5 mg total) by mouth every 12 (twelve) hours as needed for anxiety. Only under the supervision of a non-sedated companion. 15 tablet 0  . doxazosin (CARDURA) 4 MG tablet TAKE ONE TABLET BY MOUTH AT BEDTIME 28 tablet 0  . furosemide (LASIX) 20 MG tablet TAKE ONE TABLET BY MOUTH 2 TIMES A DAY. (AM & NOON) 56 tablet 0  . ipratropium-albuterol (DUONEB) 0.5-2.5 (3) MG/3ML SOLN Take 3 mLs by nebulization every 4 (four) hours as needed (cough or wheezing). 360 mL 1  . permethrin (ELIMITE) 5 % cream Apply 1 application topically once. Apply  to entire body from neck to feet and leave on overnight, avoid face. 60 g 3  . polycarbophil (FIBERCON) 625 MG tablet One by mouth twice a day.    . predniSONE (DELTASONE) 10 MG tablet Take 3 tablets (30 mg total) by mouth daily with breakfast. 15 tablet 0  . sertraline (ZOLOFT) 50 MG tablet TAKE ONE TABLET BY MOUTH EVERY DAY 28 tablet 0  . spironolactone (ALDACTONE) 25 MG tablet     . Tiotropium Bromide-Olodaterol (STIOLTO RESPIMAT) 2.5-2.5 MCG/ACT AERS Inhale 1 puff into the lungs daily. 1 Inhaler 2  . traMADol (ULTRAM) 50 MG tablet Take 1 tablet (50 mg total) by mouth every 8 (eight) hours as needed. 15 tablet  0  . triamcinolone cream (KENALOG) 0.1 % Apply topically 2 (two) times daily. 453.6 g 12  . triamcinolone cream (KENALOG) 0.5 % Apply 1 application topically 2 (two) times daily. To affected areas. 30 g 3  . vitamin B-12 (CYANOCOBALAMIN) 1000 MCG tablet TAKE ONE TABLET BY MOUTH EVERY DAY 28 tablet 0   No current facility-administered medications for this visit.    No Known Allergies  Health Maintenance Health Maintenance  Topic Date Due  . PNA vac Low Risk Adult (2 of 2 - PPSV23) 11/17/2015  . INFLUENZA VACCINE  08/02/2016  . TETANUS/TDAP  07/26/2024     Exam:  BP (!) 137/52   Pulse 70   Wt 212 lb (96.2 kg)   SpO2 97%   BMI 33.20 kg/m  Gen: Well NAD HEENT: EOMI,  MMM Lungs: Normal work of breathing. CTABL Heart: RRR no MRG Abd: NABS, Soft. Nondistended, Nontender Exts: Brisk capillary refill, warm and well perfused.  Skin: Areas of small erythematous papules with excoriation on trunk and extremities.   No results found for this or any previous visit (from the past 72 hour(s)). No results found.    Assessment and Plan: 83 y.o. male with rash. Unclear etiology. Plan for limited laboratory workup as well as treatment with Zyrtec triamcinolone cream as well as permethrin cream. I'm doubtful for scabies but permethrin cream is a reasonable treatment. Additionally we'll refer to dermatology as this is been ongoing for a little while now and not getting better.   Orders Placed This Encounter  Procedures  . CBC  . COMPLETE METABOLIC PANEL WITH GFR  . TSH  . Ambulatory referral to Dermatology    Referral Priority:   Routine    Referral Type:   Consultation    Referral Reason:   Specialty Services Required    Requested Specialty:   Dermatology    Number of Visits Requested:   1   Meds ordered this encounter  Medications  . cetirizine (ZYRTEC) 10 MG tablet    Sig: Take 1 tablet (10 mg total) by mouth daily.    Dispense:  90 tablet    Refill:  1  . triamcinolone cream  (KENALOG) 0.5 %    Sig: Apply 1 application topically 2 (two) times daily. To affected areas.    Dispense:  30 g    Refill:  3  . permethrin (ELIMITE) 5 % cream    Sig: Apply 1 application topically once. Apply to entire body from neck to feet and leave on overnight, avoid face.    Dispense:  60 g    Refill:  3     Discussed warning signs or symptoms. Please see discharge instructions. Patient expresses understanding.

## 2016-07-11 NOTE — Patient Instructions (Signed)
Thank you for coming in today. Get labs today.  Apply the pemetherine cream today and leave on overnight.  Use the higher potency Triamcinolone cream as needed.  Start Zyrtec pill daily.  You should hear about Dermatology Appointment soon.   Recheck in 1 month.    Rash A rash is a change in the color of the skin. A rash can also change the way your skin feels. There are many different conditions and factors that can cause a rash. Follow these instructions at home: Pay attention to any changes in your symptoms. Follow these instructions to help with your condition: Medicine Take or apply over-the-counter and prescription medicines only as told by your health care provider. These may include:  Corticosteroid cream.  Anti-itch lotions.  Oral antihistamines.  Skin Care  Apply cool compresses to the affected areas.  Try taking a bath with: ? Epsom salts. Follow the instructions on the packaging. You can get these at your local pharmacy or grocery store. ? Baking soda. Pour a small amount into the bath as told by your health care provider. ? Colloidal oatmeal. Follow the instructions on the packaging. You can get this at your local pharmacy or grocery store.  Try applying baking soda paste to your skin. Stir water into baking soda until it reaches a paste-like consistency.  Do not scratch or rub your skin.  Avoid covering the rash. Make sure the rash is exposed to air as much as possible. General instructions  Avoid hot showers or baths, which can make itching worse. A cold shower may help.  Avoid scented soaps, detergents, and perfumes. Use gentle soaps, detergents, perfumes, and other cosmetic products.  Avoid any substance that causes your rash. Keep a journal to help track what causes your rash. Write down: ? What you eat. ? What cosmetic products you use. ? What you drink. ? What you wear. This includes jewelry.  Keep all follow-up visits as told by your health care  provider. This is important. Contact a health care provider if:  You sweat at night.  You lose weight.  You urinate more than normal.  You feel weak.  You vomit.  Your skin or the whites of your eyes look yellow (jaundice).  Your skin: ? Tingles. ? Is numb.  Your rash: ? Does not go away after several days. ? Gets worse.  You are: ? Unusually thirsty. ? More tired than normal.  You have: ? New symptoms. ? Pain in your abdomen. ? A fever. ? Diarrhea. Get help right away if:  You develop a rash that covers all or most of your body. The rash may or may not be painful.  You develop blisters that: ? Are on top of the rash. ? Grow larger or grow together. ? Are painful. ? Are inside your nose or mouth.  You develop a rash that: ? Looks like purple pinprick-sized spots all over your body. ? Has a "bull's eye" or looks like a target. ? Is not related to sun exposure, is red and painful, and causes your skin to peel. This information is not intended to replace advice given to you by your health care provider. Make sure you discuss any questions you have with your health care provider. Document Released: 12/09/2001 Document Revised: 05/25/2015 Document Reviewed: 05/06/2014 Elsevier Interactive Patient Education  2017 [employer].

## 2016-07-12 LAB — COMPLETE METABOLIC PANEL WITH GFR
ALBUMIN: 4.2 g/dL (ref 3.6–5.1)
ALK PHOS: 111 U/L (ref 40–115)
ALT: 24 U/L (ref 9–46)
AST: 27 U/L (ref 10–35)
BUN: 18 mg/dL (ref 7–25)
CALCIUM: 8.9 mg/dL (ref 8.6–10.3)
CO2: 22 mmol/L (ref 20–31)
CREATININE: 1.31 mg/dL — AB (ref 0.70–1.11)
Chloride: 106 mmol/L (ref 98–110)
GFR, Est African American: 59 mL/min — ABNORMAL LOW (ref >=60)
GFR, Est Non African American: 51 mL/min — ABNORMAL LOW (ref >=60)
GLUCOSE: 120 mg/dL — AB (ref 65–99)
POTASSIUM: 3.9 mmol/L (ref 3.5–5.3)
SODIUM: 143 mmol/L (ref 135–146)
TOTAL PROTEIN: 7 g/dL (ref 6.1–8.1)
Total Bilirubin: 0.7 mg/dL (ref 0.2–1.2)

## 2016-07-12 LAB — TSH: TSH: 1.96 mIU/L (ref 0.40–4.50)

## 2016-07-18 ENCOUNTER — Other Ambulatory Visit: Payer: Self-pay | Admitting: Family Medicine

## 2016-07-18 DIAGNOSIS — E538 Deficiency of other specified B group vitamins: Secondary | ICD-10-CM

## 2016-07-18 DIAGNOSIS — I10 Essential (primary) hypertension: Secondary | ICD-10-CM

## 2016-07-18 DIAGNOSIS — I2510 Atherosclerotic heart disease of native coronary artery without angina pectoris: Secondary | ICD-10-CM

## 2016-07-24 ENCOUNTER — Telehealth: Payer: Self-pay | Admitting: *Deleted

## 2016-07-24 DIAGNOSIS — L270 Generalized skin eruption due to drugs and medicaments taken internally: Secondary | ICD-10-CM | POA: Diagnosis not present

## 2016-07-24 NOTE — Telephone Encounter (Signed)
Pt was seen for rash by dermatology and was told to D/C the Amlodipine and Carvedilol. She wanted to know what he should take since the dermatologist recommended that he discontinue these medications. She reports that they started him on an ointment for the rash. Will fwd to pcp for advice.Carlos Harrington Hives [place]

## 2016-07-25 NOTE — Telephone Encounter (Signed)
STOP those medicines and return to clinic to discuss Blood Pressure options.

## 2016-07-25 NOTE — Telephone Encounter (Signed)
LVM requesting pt to call the office.  

## 2016-07-25 NOTE — Telephone Encounter (Signed)
lvm and asked that pt's daughter call back and schedule an appt to discuss medication options.Audelia Hives [place]

## 2016-07-27 ENCOUNTER — Emergency Department (INDEPENDENT_AMBULATORY_CARE_PROVIDER_SITE_OTHER): Payer: Medicare Other

## 2016-07-27 ENCOUNTER — Emergency Department (INDEPENDENT_AMBULATORY_CARE_PROVIDER_SITE_OTHER)
Admission: EM | Admit: 2016-07-27 | Discharge: 2016-07-27 | Disposition: A | Discharge status: Home / Self Care | Payer: Medicare Other | Source: Home / Self Care | Attending: Family Medicine | Admitting: Family Medicine

## 2016-07-27 ENCOUNTER — Encounter: Payer: Self-pay | Admitting: Emergency Medicine

## 2016-07-27 DIAGNOSIS — J181 Lobar pneumonia, unspecified organism: Secondary | ICD-10-CM | POA: Diagnosis not present

## 2016-07-27 DIAGNOSIS — R0602 Shortness of breath: Secondary | ICD-10-CM

## 2016-07-27 DIAGNOSIS — J189 Pneumonia, unspecified organism: Secondary | ICD-10-CM

## 2016-07-27 LAB — POCT CBC W AUTO DIFF (K'VILLE URGENT CARE)

## 2016-07-27 MED ORDER — LEVOFLOXACIN 500 MG PO TABS: 500.0000 mg | ORAL_TABLET | Freq: Every day | ORAL | 0 refills | Status: DC

## 2016-07-27 MED ORDER — METHYLPREDNISOLONE SODIUM SUCC 125 MG IJ SOLR
80.0000 mg | Freq: Once | INTRAMUSCULAR | Status: AC
  Administered 2016-07-27: 80 mg via INTRAMUSCULAR

## 2016-07-27 MED ORDER — PREDNISONE 20 MG PO TABS: ORAL_TABLET | ORAL | 0 refills | Status: DC

## 2016-07-27 NOTE — ED Triage Notes (Signed)
Pt c/o cough and fever of 100.2 since yesterday. States he has used his nebulizer x3 today. Hx of frequent pneumonia.

## 2016-07-27 NOTE — ED Provider Notes (Signed)
[facility]    CSN: [id number] Arrival date & time: 07/27/16  1717     History   Chief Complaint Chief Complaint  Patient presents with  . Cough    HPI Carlos Harrington is a 83 y.o. male.   Patient complains of increased cough for two days, and today developed fever to 101.2.  He has used his nebulizer three times today (last time two hours ago).  No pleuritic pain.  He has a history of recurrent pneumonia.   The history is provided by the patient.    Past Medical History:  Diagnosis Date  . Anxiety   . CAD (coronary artery disease)    with stenting X 4 in [place]  . Depression   . Hyperlipidemia   . Hypertension   . Macular degeneration   . Mild cognitive impairment 01/07/2016    Patient Active Problem List   Diagnosis Date Noted  . OSA (obstructive sleep apnea) 05/01/2016  . Depression, major, single episode, moderate ([place]) 05/01/2016  . Pulmonary nodule 02/24/2016  . Mild cognitive impairment 01/07/2016  . Memory difficulties 10/25/2015  . Acute combined systolic and diastolic congestive heart failure ([place]) 10/06/2015  . DNR (do not resuscitate) 10/06/2015  . Cough 09/26/2013  . Fecal incontinence 08/28/2013  . Anxiety 05/05/2013  . GERD (gastroesophageal reflux disease) 03/24/2013  . BPH with elevated PSA 12/17/2012  . Pre-diabetes 11/27/2012  . Right carotid bruit 09/24/2012  . CKD (chronic kidney disease) stage 3, GFR 30-59 ml/min 02/21/2012  . Wears hearing aid 10/11/2011  . HYPERTENSIVE RETINOPATHY 09/17/2009  . [place] OCCLUSION 09/17/2009  . 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 Vitamin B 12 deficiency 07/29/2009  . THROMBOCYTOPENIA 06/24/2009  . Hyperlipidemia 05/28/2009  . OBESITY, CLASS I 05/28/2009  . Reactive depression 05/28/2009  . MACULAR DEGENERATION 05/28/2009  . Essential hypertension, benign 05/28/2009  . Coronary artery disease with history of myocardial infarction without history of CABG 05/28/2009    Past  Surgical History:  Procedure Laterality Date  . APPENDECTOMY    . CHOLECYSTECTOMY    . TONSILLECTOMY         Home Medications    Prior to Admission medications   Medication Sig Start Date End Date Taking? Authorizing Provider  albuterol (PROVENTIL HFA;VENTOLIN HFA) 108 (90 BASE) MCG/ACT inhaler Inhale 1-2 puffs into the lungs every 6 (six) hours as needed for wheezing or shortness of breath. 12/20/14   Noe Gens, PA-C  albuterol (PROVENTIL) (2.5 MG/3ML) 0.083% nebulizer solution Take 3 mLs (2.5 mg total) by nebulization every 6 (six) hours as needed for Wheezing. 02/23/16   Gregor Hams, MD  AMBULATORY NON FORMULARY MEDICATION Stair Lift at home for use as needed. 10/06/15   Gregor Hams, MD  AMBULATORY NON FORMULARY MEDICATION Continuous positive airway pressure (CPAP) machine auto-titrate from 4-20 cm of H2O pressure, with all supplemental supplies as needed. 05/25/16   Gregor Hams, MD  amLODipine (NORVASC) 5 MG tablet TAKE ONE TABLET BY MOUTH EVERY DAY 07/18/16   Gregor Hams, MD  atorvastatin (LIPITOR) 80 MG tablet Take 1 tablet (80 mg total) by mouth daily. 11/20/13   Hommel, Hilliard Clark, DO  BRILINTA 90 MG TABS tablet TAKE ONE TABLET BY MOUTH 2 TIMES A DAY 06/19/16   Gregor Hams, MD  budesonide-formoterol [facility]) 160-4.5 MCG/ACT inhaler Inhale 1 puff into the lungs 2 (two) times daily. 03/09/16   Gregor Hams, MD  carvedilol (COREG) 6.25 MG tablet Take 1 tablet (6.25 mg  total) by mouth 2 (two) times daily. 08/17/15   Marcial Pacas, DO  cetirizine (ZYRTEC) 10 MG tablet Take 1 tablet (10 mg total) by mouth daily. 07/11/16   Gregor Hams, MD  clobetasol cream (TEMOVATE) 6.57 % Apply 1 application topically 2 (two) times daily. 08/02/15   Hommel, Hilliard Clark, DO  diazepam (VALIUM) 5 MG tablet Take 0.5-1 tablets (2.5-5 mg total) by mouth every 12 (twelve) hours as needed for anxiety. Only under the supervision of a non-sedated companion. 02/23/14   Donella Stade, PA-C  doxazosin (CARDURA) 4 MG  tablet TAKE ONE TABLET BY MOUTH AT BEDTIME 07/18/16   Gregor Hams, MD  furosemide (LASIX) 20 MG tablet TAKE ONE TABLET BY MOUTH 2 TIMES A DAY. (AM & NOON) 06/19/16   Gregor Hams, MD  ipratropium-albuterol (DUONEB) 0.5-2.5 (3) MG/3ML SOLN Take 3 mLs by nebulization every 4 (four) hours as needed (cough or wheezing). 12/22/14   Marcial Pacas, DO  levofloxacin (LEVAQUIN) 500 MG tablet Take 1 tablet (500 mg total) by mouth daily. 07/27/16 08/06/16  Kandra Nicolas, MD  polycarbophil (FIBERCON) 625 MG tablet One by mouth twice a day. 11/17/14   Marcial Pacas, DO  predniSONE (DELTASONE) 20 MG tablet Take one tab by mouth twice daily for 5 days, then one daily for 3 days. Take with food. 07/27/16   Kandra Nicolas, MD  QC LO-DOSE ASPIRIN 81 MG EC tablet TAKE ONE TABLET BY MOUTH EVERY DAY 07/18/16   Gregor Hams, MD  sertraline (ZOLOFT) 50 MG tablet TAKE ONE TABLET BY MOUTH EVERY DAY 06/19/16   Gregor Hams, MD  spironolactone (ALDACTONE) 25 MG tablet  06/25/13   [provider]  Tiotropium Bromide-Olodaterol (STIOLTO RESPIMAT) 2.5-2.5 MCG/ACT AERS Inhale 1 puff into the lungs daily. 03/09/16   Gregor Hams, MD  traMADol (ULTRAM) 50 MG tablet Take 1 tablet (50 mg total) by mouth every 8 (eight) hours as needed. 01/01/15   Gregor Hams, MD  triamcinolone cream (KENALOG) 0.1 % Apply topically 2 (two) times daily. 10/06/15   Gregor Hams, MD  triamcinolone cream (KENALOG) 0.5 % Apply 1 application topically 2 (two) times daily. To affected areas. 07/11/16   Gregor Hams, MD  vitamin B-12 (CYANOCOBALAMIN) 1000 MCG tablet Take one tablet by mouth daily.  07/18/16   Gregor Hams, MD    Family History Family History  Problem Relation Age of Onset  . Hypertension Mother   . Hyperlipidemia Mother   . Diabetes Mother   . Other Mother        CVA  . Depression Mother   . Other Sister        brain tumor  . Other Brother        CVA    Social History Social History  Substance Use Topics  . Smoking  status: Never Smoker  . Smokeless tobacco: Never Used  . Alcohol use Yes     Comment: 2 daily     Allergies   Patient has no known allergies.   Review of Systems Review of Systems  No sore throat + cough No pleuritic pain ? wheezing + nasal congestion No post-nasal drainage No sinus pain/pressure No itchy/red eyes No earache No hemoptysis + SOB + fever, + chills No nausea No vomiting No abdominal pain No diarrhea No urinary symptoms No skin rash + fatigue No myalgias No headache     Physical Exam Triage Vital Signs ED Triage Vitals [07/27/16 1746]  Enc Vitals Group     BP (!) 148/70     Pulse Rate 84     Resp      Temp 100.2 F (37.9 C)     Temp Source Oral     SpO2 91 %     Weight      Height      Head Circumference      Peak Flow      Pain Score 0     Pain Loc      Pain Edu?      Excl. in Home?    No data found.   Updated Vital Signs BP (!) 148/70 (BP Location: Right Arm)   Pulse 84   Temp 100.2 F (37.9 C) (Oral)   SpO2 91%   Visual Acuity Right Eye Distance:   Left Eye Distance:   Bilateral Distance:    Right Eye Near:   Left Eye Near:    Bilateral Near:     Physical Exam Nursing notes and Vital Signs reviewed. Appearance:  Patient appears stated age, and in no acute distress Eyes:  Pupils are equal, round, and reactive to light and accomodation.  Extraocular movement is intact.  Conjunctivae are not inflamed  Ears:  Canals normal.  Tympanic membranes normal.  Nose:  Mildly congested turbinates.  No sinus tenderness.   Pharynx:  Normal Neck:  Supple.  No adenopathy.  Lungs:   Scattered bilateral rhonchi and course breath sounds.  Breath sounds are equal.  Moving air well. Heart:  Regular rate and rhythm without murmurs, rubs, or gallops.  Abdomen:  Nontender without masses or hepatosplenomegaly.  Bowel sounds are present.  No CVA or flank tenderness.  Extremities:  No edema.  Skin:  No rash present.    UC Treatments / Results   Labs (all labs ordered are listed, but only abnormal results are displayed) Labs Reviewed  POCT CBC W AUTO DIFF ([facility]):  WBC 8.7; LY 11.7; MO 5.7; GR 82.6; Hgb 13.4; Platelets 211     EKG  EKG Interpretation None       Radiology Dg Chest 2 View  Result Date: 07/27/2016 CLINICAL DATA:  Shortness of breath for 1 week. EXAM: CHEST  2 VIEW COMPARISON:  CT of the chest 02/24/2016 FINDINGS: The cardiac silhouette is enlarged. Mediastinal contours appear intact. Calcific atherosclerotic disease and tortuosity of the aorta. There is no evidence of pleural effusion or pneumothorax. Airspace consolidation in the right middle lobe with some volume loss. Osseous structures are without acute abnormality. Soft tissues are grossly normal. IMPRESSION: Right middle lobe airspace consolidation. In the acute clinical setting this likely represents lobar pneumonia. Follow-up to resolution after empiric treatment is recommended. Electronically Signed   By: Fidela Salisbury M.D.   On: 07/27/2016 18:02    Procedures Procedures (including critical care time)  Medications Ordered in UC Medications  methylPREDNISolone sodium succinate (SOLU-MEDROL) 125 mg/2 mL injection 80 mg (not administered)     Initial Impression / Assessment and Plan / UC Course  I have reviewed the triage vital signs and the nursing notes.  Pertinent labs & imaging results that were available during my care of the patient were reviewed by me and considered in my medical decision making (see chart for details).    Administered Solumedrol 80mg  IM. Begin Levaquin 500mg  daily for 10 days. Begin prednisone burst/taper Friday 07/28/16. Take plain guaifenesin (1200mg  extended release tabs such as Mucinex) twice daily, with plenty of water, for cough and  congestion.  Continue nebulizer as prescribed. Stop all antihistamines for now, and other non-prescription cough/cold preparations. If symptoms become significantly  worse during the night or over the weekend, proceed to the local emergency room.  Followup with Dr. Georgina Snell tomorrow as scheduled.    Final Clinical Impressions(s) / UC Diagnoses   Final diagnoses:  Community acquired pneumonia of right middle lobe of lung ([place])    New Prescriptions New Prescriptions   LEVOFLOXACIN (LEVAQUIN) 500 MG TABLET    Take 1 tablet (500 mg total) by mouth daily.   PREDNISONE (DELTASONE) 20 MG TABLET    Take one tab by mouth twice daily for 5 days, then one daily for 3 days. Take with food.     Kandra Nicolas, MD 08/08/16 1101

## 2016-07-27 NOTE — Discharge Instructions (Signed)
Begin prednisone Friday 07/28/16. Take plain guaifenesin (1200mg  extended release tabs such as Mucinex) twice daily, with plenty of water, for cough and congestion.  Continue nebulizer as prescribed. Stop all antihistamines for now, and other non-prescription cough/cold preparations. If symptoms become significantly worse during the night or over the weekend, proceed to the local emergency room.

## 2016-07-28 ENCOUNTER — Ambulatory Visit (INDEPENDENT_AMBULATORY_CARE_PROVIDER_SITE_OTHER): Payer: Medicare Other | Admitting: Family Medicine

## 2016-07-28 ENCOUNTER — Encounter: Payer: Self-pay | Admitting: Family Medicine

## 2016-07-28 ENCOUNTER — Other Ambulatory Visit: Payer: Self-pay | Admitting: Family Medicine

## 2016-07-28 VITALS — BP 123/67 | HR 80 | Ht 67.0 in | Wt 209.0 lb

## 2016-07-28 DIAGNOSIS — I2510 Atherosclerotic heart disease of native coronary artery without angina pectoris: Secondary | ICD-10-CM

## 2016-07-28 DIAGNOSIS — I10 Essential (primary) hypertension: Secondary | ICD-10-CM

## 2016-07-28 DIAGNOSIS — L270 Generalized skin eruption due to drugs and medicaments taken internally: Secondary | ICD-10-CM

## 2016-07-28 DIAGNOSIS — J189 Pneumonia, unspecified organism: Secondary | ICD-10-CM | POA: Diagnosis not present

## 2016-07-28 MED ORDER — PREDNISONE 50 MG PO TABS: 50.0000 mg | ORAL_TABLET | Freq: Every day | ORAL | 0 refills | Status: DC

## 2016-07-28 MED ORDER — LEVOFLOXACIN 500 MG PO TABS: 500.0000 mg | ORAL_TABLET | Freq: Every day | ORAL | 0 refills | Status: DC

## 2016-07-28 NOTE — Progress Notes (Signed)
Carlos Harrington is a 83 y.o. male who presents to [facility] today for follow-up rash and discuss recent diagnosis of pneumonia.  Patient was seen previously for rash. After seeing a dermatologist it was discovered to be a drug rash due to either Coreg or amlodipine. He has stopped both of these medications and is feeling much better. He denies chest pain or palpitations.  He does however note cough and wheezing. He was seen in urgent care yesterday where he was diagnosed with community-acquired pneumonia and prescribed Levaquin and prednisone. He has not yesterday taking his medications as he has not picked them up from the pharmacy.   Past Medical History:  Diagnosis Date  . Anxiety   . CAD (coronary artery disease)    with stenting X 4 in [place]  . Depression   . Hyperlipidemia   . Hypertension   . Macular degeneration   . Mild cognitive impairment 01/07/2016   Past Surgical History:  Procedure Laterality Date  . APPENDECTOMY    . CHOLECYSTECTOMY    . TONSILLECTOMY     Social History  Substance Use Topics  . Smoking status: Never Smoker  . Smokeless tobacco: Never Used  . Alcohol use Yes     Comment: 2 daily   family history includes Depression in his mother; Diabetes in his mother; Hyperlipidemia in his mother; Hypertension in his mother; Other in his brother, mother, and sister.  ROS as above:  Medications: Current Outpatient Prescriptions  Medication Sig Dispense Refill  . albuterol (PROVENTIL HFA;VENTOLIN HFA) 108 (90 BASE) MCG/ACT inhaler Inhale 1-2 puffs into the lungs every 6 (six) hours as needed for wheezing or shortness of breath. 1 Inhaler 0  . albuterol (PROVENTIL) (2.5 MG/3ML) 0.083% nebulizer solution Take 3 mLs (2.5 mg total) by nebulization every 6 (six) hours as needed for Wheezing. 75 mL 6  . atorvastatin (LIPITOR) 80 MG tablet Take 40 mg by  mouth daily.     [name] BRILINTA 90 MG TABS tablet TAKE ONE TABLET BY MOUTH 2 TIMES A DAY 56 tablet 0  . budesonide-formoterol (SYMBICORT) 160-4.5 MCG/ACT inhaler Inhale 1 puff into the lungs 2 (two) times daily. 1 Inhaler 3  . cetirizine (ZYRTEC) 10 MG tablet Take 1 tablet (10 mg total) by mouth daily. 90 tablet 1  . clobetasol cream (TEMOVATE) 7.12 % Apply 1 application topically 2 (two) times daily. 30 g 0  . doxazosin (CARDURA) 4 MG tablet TAKE ONE TABLET BY MOUTH AT BEDTIME 28 tablet 0  . furosemide (LASIX) 20 MG tablet TAKE ONE TABLET BY MOUTH 2 TIMES A DAY. (AM & NOON) 56 tablet 0  . ipratropium-albuterol (DUONEB) 0.5-2.5 (3) MG/3ML SOLN Take 3 mLs by nebulization every 4 (four) hours as needed (cough or wheezing). 360 mL 1  . QC LO-DOSE ASPIRIN 81 MG EC tablet TAKE ONE TABLET BY MOUTH EVERY DAY 28 tablet 0  . sertraline (ZOLOFT) 50 MG tablet TAKE ONE TABLET BY MOUTH EVERY DAY 28 tablet 0  . Tiotropium Bromide-Olodaterol (STIOLTO RESPIMAT) 2.5-2.5 MCG/ACT AERS Inhale 1 puff into the lungs daily. 1 Inhaler 2  . vitamin B-12 (CYANOCOBALAMIN) 1000 MCG tablet Take one tablet by mouth daily.  28 tablet 5  . AMBULATORY NON FORMULARY MEDICATION Stair Lift at home for use as needed. 1 each 0  . AMBULATORY NON FORMULARY MEDICATION Continuous positive airway pressure (CPAP) machine auto-titrate from 4-20 cm of H2O pressure, with all supplemental supplies as needed.  1 each 0  . levofloxacin (LEVAQUIN) 500 MG tablet Take 1 tablet (500 mg total) by mouth daily. 7 tablet 0  . predniSONE (DELTASONE) 50 MG tablet Take 1 tablet (50 mg total) by mouth daily. 5 tablet 0   No current facility-administered medications for this visit.    Allergies  Allergen Reactions  . Amlodipine Itching  . Carvedilol Itching    Health Maintenance Health Maintenance  Topic Date Due  . PNA vac Low Risk Adult (2 of 2 - PPSV23) 11/17/2015  . INFLUENZA VACCINE  08/02/2016  . TETANUS/TDAP  07/26/2024     Exam:  BP 123/67    Pulse 80   Ht 5\' 7"  (1.702 m)   Wt 209 lb (94.8 kg)   SpO2 97%   BMI 32.73 kg/m  Gen: Well NAD HEENT: EOMI,  MMM Lungs: Mild increased work of breathing. Wheezing present bilaterally. Heart: RRR no MRG Abd: NABS, Soft. Nondistended, Nontender Exts: Brisk capillary refill, warm and well perfused.  Skin: No rash   Results for orders placed or performed during the hospital encounter of 07/27/16 (from the past 72 hour(s))  CBC w auto diff ([facility])     Status: None   Collection Time: 07/27/16  6:37 PM  Result Value Ref Range   WBC  4.5 - 10.5 K/uL    Comment: see scanned report   Lymphocytes relative %  15 - 45 %   Monocytes relative %  2 - 10 %   Neutrophils relative % (GR)  44 - 76 %   Lymphocytes absolute  0.1 - 1.8 K/uL   Monocyes absolute  0.1 - 1 K/uL   Neutrophils absolute (GR#)  1.7 - 7.7 K/uL   RBC  4.2 - 5.8 MIL/uL   Hemoglobin  13 - 17 g/dL   Hematocrit  38.5 - 51 %   MCV  80 - 98 fL   MCH  26.5 - 32.5 pg   MCHC  32.5 - 36.9 g/dL   RDW  11.6 - 14 %   Platelet count  140 - 400 K/uL   MPV  7.8 - 11 fL   Dg Chest 2 View  Result Date: 07/27/2016 CLINICAL DATA:  Shortness of breath for 1 week. EXAM: CHEST  2 VIEW COMPARISON:  CT of the chest 02/24/2016 FINDINGS: The cardiac silhouette is enlarged. Mediastinal contours appear intact. Calcific atherosclerotic disease and tortuosity of the aorta. There is no evidence of pleural effusion or pneumothorax. Airspace consolidation in the right middle lobe with some volume loss. Osseous structures are without acute abnormality. Soft tissues are grossly normal. IMPRESSION: Right middle lobe airspace consolidation. In the acute clinical setting this likely represents lobar pneumonia. Follow-up to resolution after empiric treatment is recommended. Electronically Signed   By: Fidela Salisbury M.D.   On: 07/27/2016 18:02      Assessment and Plan: 83 y.o. male with  Drug rash: Improving off of amlodipine and Coreg.  Blood pressure and heart rate are well controlled without these medications. Plan for watchful waiting off of them.  Community-acquired pneumonia: Agree with prednisone and Levaquin. Patient to pick these medications up from the pharmacy. I called [facility] to update the packaging.  Recheck with me in a few weeks.   No orders of the defined types were placed in this encounter.  Meds ordered this encounter  Medications  . levofloxacin (LEVAQUIN) 500 MG tablet    Sig: Take 1 tablet (500 mg total) by mouth daily.  Dispense:  7 tablet    Refill:  0  . predniSONE (DELTASONE) 50 MG tablet    Sig: Take 1 tablet (50 mg total) by mouth daily.    Dispense:  5 tablet    Refill:  0     Discussed warning signs or symptoms. Please see discharge instructions. Patient expresses understanding.

## 2016-07-28 NOTE — Patient Instructions (Addendum)
Thank you for coming in today. Go to the pharmacy and get the antibipotics and prednisone today.  STOP Coreg and Amlodipine.  Start Levaquin and Prednisone.   Recheck with me in about 2 weeks.    Call or go to the emergency room if you get worse, have trouble breathing, have chest pains, or palpitations.

## 2016-08-02 DIAGNOSIS — I252 Old myocardial infarction: Secondary | ICD-10-CM | POA: Diagnosis not present

## 2016-08-02 DIAGNOSIS — I2510 Atherosclerotic heart disease of native coronary artery without angina pectoris: Secondary | ICD-10-CM | POA: Diagnosis not present

## 2016-08-02 DIAGNOSIS — I10 Essential (primary) hypertension: Secondary | ICD-10-CM | POA: Diagnosis not present

## 2016-08-02 DIAGNOSIS — G4733 Obstructive sleep apnea (adult) (pediatric): Secondary | ICD-10-CM | POA: Diagnosis not present

## 2016-08-03 ENCOUNTER — Ambulatory Visit (INDEPENDENT_AMBULATORY_CARE_PROVIDER_SITE_OTHER): Payer: Medicare Other | Admitting: Physician Assistant

## 2016-08-03 ENCOUNTER — Encounter: Payer: Self-pay | Admitting: Physician Assistant

## 2016-08-03 VITALS — BP 129/65 | HR 70 | Ht 67.0 in | Wt 207.8 lb

## 2016-08-03 DIAGNOSIS — I2510 Atherosclerotic heart disease of native coronary artery without angina pectoris: Secondary | ICD-10-CM | POA: Diagnosis not present

## 2016-08-03 DIAGNOSIS — J181 Lobar pneumonia, unspecified organism: Secondary | ICD-10-CM

## 2016-08-03 DIAGNOSIS — F3341 Major depressive disorder, recurrent, in partial remission: Secondary | ICD-10-CM | POA: Diagnosis not present

## 2016-08-03 DIAGNOSIS — J189 Pneumonia, unspecified organism: Secondary | ICD-10-CM

## 2016-08-03 DIAGNOSIS — J9809 Other diseases of bronchus, not elsewhere classified: Secondary | ICD-10-CM | POA: Diagnosis not present

## 2016-08-03 MED ORDER — ALBUTEROL SULFATE HFA 108 (90 BASE) MCG/ACT IN AERS: 1.0000 | INHALATION_SPRAY | Freq: Four times a day (QID) | RESPIRATORY_TRACT | 1 refills | Status: AC | PRN

## 2016-08-03 MED ORDER — TIOTROPIUM BROMIDE-OLODATEROL 2.5-2.5 MCG/ACT IN AERS: 1.0000 | INHALATION_SPRAY | Freq: Every day | RESPIRATORY_TRACT | 2 refills | Status: DC

## 2016-08-03 MED ORDER — SERTRALINE HCL 100 MG PO TABS: 100.0000 mg | ORAL_TABLET | Freq: Every day | ORAL | 1 refills | Status: DC

## 2016-08-03 MED ORDER — PREDNISONE 10 MG (48) PO TBPK: ORAL_TABLET | Freq: Every day | ORAL | 0 refills | Status: DC

## 2016-08-03 NOTE — Patient Instructions (Addendum)
For mood: - Increase zoloft to 100mg  daily (new prescription sent) - Encourage daily 10-15 minute walk. Rest if chest pain or shortness of breath - Follow-up with Dr. Georgina Snell in 4 weeks  For pneumonia/COPD - Finish your Levaquin - Start new steroid taper (sent to pharmacy) - Continue your Stiolto 1 puff daily - Use your Albuterol inhaler 1-2 puffs every 6 hours as needed for shortness of breath/wheezing - Okay to fly

## 2016-08-03 NOTE — Progress Notes (Signed)
HPI:                                                                Carlos Harrington is a 83 y.o. male who presents to [place] today for pneumonia and depression follow-up  Patient with PMH of CVA, memory changes, and major depressive disorder presents with daughter today who states patient has been acting out at her and irritable. Reports behavioral changes have been worsening for the last month. He is currently on Zoloft 50 mg, which was started by his PCP on 05/01/16. It is not clear if he is taking it consistently due to memory issues. Patient denies depressed mood. He does endorse some anhedonia and reports that he keeps to himself most days. He is followed by [facility]. He was last evaluated on 03/30/16 for cognitive testing. Per Neurology, it was felt that his memory changes are secondary to MDD, single episode, mild.  Previous medications: Lexapro, Valium  [name] was also recently treated for CAP in urgent care on 07/27/16. His daughter reports that he has an upcoming flight to [facility] to visit a friend. They are requesting he be medically cleared to travel. Patient reports he has forgotten to take his Levaquin consistently and he has a few pills left. He endorses wheezing and intermittent shortness of breath. Denies cough, chest pain, or hemoptysis. Denies fever, chills, diaphoresis or weakness.   Past Medical History:  Diagnosis Date  . Anxiety   . CAD (coronary artery disease)    with stenting X 4 in [place]  . Depression   . Hyperlipidemia   . Hypertension   . Macular degeneration   . Mild cognitive impairment 01/07/2016   Past Surgical History:  Procedure Laterality Date  . APPENDECTOMY    . CHOLECYSTECTOMY    . TONSILLECTOMY     Social History  Substance Use Topics  . Smoking status: Never Smoker  . Smokeless tobacco: Never Used  . Alcohol use Yes     Comment: 2 daily   family history includes Depression in his  mother; Diabetes in his mother; Hyperlipidemia in his mother; Hypertension in his mother; Other in his brother, mother, and sister.  ROS: negative except as noted in the HPI  Medications: Current Outpatient Prescriptions  Medication Sig Dispense Refill  . albuterol (PROVENTIL HFA;VENTOLIN HFA) 108 (90 Base) MCG/ACT inhaler Inhale 1-2 puffs into the lungs every 6 (six) hours as needed for wheezing or shortness of breath. 1 Inhaler 1  . albuterol (PROVENTIL) (2.5 MG/3ML) 0.083% nebulizer solution Take 3 mLs (2.5 mg total) by nebulization every 6 (six) hours as needed for Wheezing. 75 mL 6  . AMBULATORY NON FORMULARY MEDICATION Stair Lift at home for use as needed. 1 each 0  . AMBULATORY NON FORMULARY MEDICATION Continuous positive airway pressure (CPAP) machine auto-titrate from 4-20 cm of H2O pressure, with all supplemental supplies as needed. 1 each 0  . atorvastatin (LIPITOR) 80 MG tablet Take 40 mg by mouth daily.     [name] BRILINTA 90 MG TABS tablet TAKE ONE TABLET BY MOUTH 2 TIMES A DAY 56 tablet 0  . cetirizine (ZYRTEC) 10 MG tablet Take 1 tablet (10 mg total) by mouth daily. 90 tablet 1  .  clobetasol cream (TEMOVATE) 7.56 % Apply 1 application topically 2 (two) times daily. 30 g 0  . doxazosin (CARDURA) 4 MG tablet TAKE ONE TABLET BY MOUTH AT BEDTIME 28 tablet 0  . furosemide (LASIX) 20 MG tablet TAKE ONE TABLET BY MOUTH 2 TIMES A DAY. (AM & NOON) 56 tablet 0  . levofloxacin (LEVAQUIN) 500 MG tablet Take 1 tablet (500 mg total) by mouth daily. 7 tablet 0  . QC LO-DOSE ASPIRIN 81 MG EC tablet TAKE ONE TABLET BY MOUTH EVERY DAY 28 tablet 0  . sertraline (ZOLOFT) 100 MG tablet Take 1 tablet (100 mg total) by mouth daily. 30 tablet 1  . Tiotropium Bromide-Olodaterol (STIOLTO RESPIMAT) 2.5-2.5 MCG/ACT AERS Inhale 1 puff into the lungs daily. 1 Inhaler 2  . vitamin B-12 (CYANOCOBALAMIN) 1000 MCG tablet Take one tablet by mouth daily.  28 tablet 5  . predniSONE (STERAPRED UNI-PAK 48 TAB) 10 MG (48)  TBPK tablet Take by mouth daily. Use as directed for taper 1 tablet 0   No current facility-administered medications for this visit.    Allergies  Allergen Reactions  . Amlodipine Itching  . Carvedilol Itching       Objective:  BP 129/65   Pulse 70   Ht 5\' 7"  (1.702 m)   Wt 207 lb 12.8 oz (94.3 kg)   SpO2 96%   BMI 32.55 kg/m  Gen:  Obese male, not ill-appearing, no distress, appears stated age HEENT: normal conjunctiva, neck supple, trachea midline Pulm: Normal work of breathing, normal phonation, expiratory wheezes bilaterally, no rales or rhonchi CV: Normal rate, regular rhythm, s1 and s2 distinct, no murmurs, clicks or rubs  Neuro: alert and oriented x 3, normal tone, no tremor MSK: extremities atraumatic, normal gait and station, no peripheral edema Lymph: no cervical or tonsillar adenopathy Skin: warm, dry, intact; no rashes on exposed skin, no cyanosis Psych: well-groomed, cooperative, good eye contact, euthymic mood, affect mood-congruent, normal speech and thought content   No results found for this or any previous visit (from the past 72 hour(s)). No results found.  Depression screen [facility] 2/9 08/03/2016 01/25/2016 03/24/2013  Decreased Interest 1 0 0  Down, Depressed, Hopeless 0 0 0  PHQ - 2 Score 1 0 0  Altered sleeping 1 1 -  Tired, decreased energy 1 1 -  Change in appetite 0 0 -  Feeling bad or failure about yourself  1 0 -  Trouble concentrating 1 1 -  Moving slowly or fidgety/restless 0 0 -  Suicidal thoughts 0 0 -  PHQ-9 Score 5 3 -   GAD 7 : Generalized Anxiety Score 08/03/2016 01/25/2016  Nervous, Anxious, on Edge 1 3  Control/stop worrying 1 0  Worry too much - different things 1 0  Trouble relaxing 1 1  Restless 0 0  Easily annoyed or irritable 1 1  Afraid - awful might happen 1 0  Total GAD 7 Score 6 5      Assessment and Plan: 83 y.o. male with   1. Acquired bronchomalacia, Community acquired pneumonia - patient passed 6 minute walk  test, with SpO2 not dropping below 96% on RA with exercise - reviewed patient's recent Chest X-ray from 07/27/16, which showed a RML consolidation, and CT Chest w/o contrast from 02/24/16 which showed diffuse airway thickening with marked bronchomalacia - instructed to complete Levaquin. Started on prednisone taper for wheezes - educated on proper use of Stiolto and instructed to use daily - albuterol (PROVENTIL HFA;VENTOLIN HFA) 108 (90  Base) MCG/ACT inhaler; Inhale 1-2 puffs into the lungs every 6 (six) hours as needed for wheezing or shortness of breath.  Dispense: 1 Inhaler; Refill: 1 - predniSONE (STERAPRED UNI-PAK 48 TAB) 10 MG (48) TBPK tablet; Take by mouth daily. Use as directed for taper  Dispense: 1 tablet; Refill: 0 - Tiotropium Bromide-Olodaterol (STIOLTO RESPIMAT) 2.5-2.5 MCG/ACT AERS; Inhale 1 puff into the lungs daily.  Dispense: 1 Inhaler; Refill: 2  2. Recurrent major depressive disorder, in partial remission (HCC) - PHQ2 score 1, PHQ9 score 5. Increasing Zoloft to 100mg  daily - encouraged daily walking - sertraline (ZOLOFT) 100 MG tablet; Take 1 tablet (100 mg total) by mouth daily.  Dispense: 30 tablet; Refill: 1   Patient education and anticipatory guidance given Patient agrees with treatment plan Follow-up as needed if symptoms worsen or fail to improve  Darlyne Russian PA-C

## 2016-08-07 ENCOUNTER — Ambulatory Visit: Payer: Self-pay | Admitting: Family Medicine

## 2016-08-10 DIAGNOSIS — F3341 Major depressive disorder, recurrent, in partial remission: Secondary | ICD-10-CM | POA: Insufficient documentation

## 2016-08-10 DIAGNOSIS — J189 Pneumonia, unspecified organism: Secondary | ICD-10-CM | POA: Insufficient documentation

## 2016-08-10 DIAGNOSIS — J181 Lobar pneumonia, unspecified organism: Secondary | ICD-10-CM

## 2016-08-11 ENCOUNTER — Ambulatory Visit: Payer: Self-pay | Admitting: Family Medicine

## 2016-08-14 ENCOUNTER — Ambulatory Visit: Payer: Self-pay | Admitting: Family Medicine

## 2016-08-22 ENCOUNTER — Other Ambulatory Visit: Payer: Self-pay | Admitting: Family Medicine

## 2016-08-22 DIAGNOSIS — I2510 Atherosclerotic heart disease of native coronary artery without angina pectoris: Secondary | ICD-10-CM

## 2016-08-22 DIAGNOSIS — I10 Essential (primary) hypertension: Secondary | ICD-10-CM

## 2016-09-11 ENCOUNTER — Encounter: Payer: Self-pay | Admitting: Family Medicine

## 2016-09-11 ENCOUNTER — Ambulatory Visit (INDEPENDENT_AMBULATORY_CARE_PROVIDER_SITE_OTHER): Payer: Medicare Other

## 2016-09-11 ENCOUNTER — Ambulatory Visit (INDEPENDENT_AMBULATORY_CARE_PROVIDER_SITE_OTHER): Payer: Medicare Other | Admitting: Family Medicine

## 2016-09-11 VITALS — BP 183/67 | HR 69 | Temp 98.0°F | Wt 220.0 lb

## 2016-09-11 DIAGNOSIS — R05 Cough: Secondary | ICD-10-CM

## 2016-09-11 DIAGNOSIS — Z23 Encounter for immunization: Secondary | ICD-10-CM

## 2016-09-11 DIAGNOSIS — R059 Cough, unspecified: Secondary | ICD-10-CM

## 2016-09-11 DIAGNOSIS — R062 Wheezing: Secondary | ICD-10-CM | POA: Diagnosis not present

## 2016-09-11 DIAGNOSIS — I2510 Atherosclerotic heart disease of native coronary artery without angina pectoris: Secondary | ICD-10-CM

## 2016-09-11 DIAGNOSIS — I5041 Acute combined systolic (congestive) and diastolic (congestive) heart failure: Secondary | ICD-10-CM | POA: Diagnosis not present

## 2016-09-11 MED ORDER — IPRATROPIUM-ALBUTEROL 0.5-2.5 (3) MG/3ML IN SOLN: 3.0000 mL | Freq: Four times a day (QID) | RESPIRATORY_TRACT | 1 refills | Status: DC | PRN

## 2016-09-11 NOTE — Progress Notes (Signed)
Carlos Harrington is a 83 y.o. male who presents to [place]: [place] today for cough and wheezing. Patient notes continued cough and wheezing. He's had symptoms now for several months. He had a pneumonia several months ago which was well treated with Levaquin. He uses albuterol nebulizer several times per day which does help. He was unable to afford Stealto as it is more than $100 per day. He denies chest pain palpitations shortness of breath or leg swelling.   Past Medical History:  Diagnosis Date  . Anxiety   . CAD (coronary artery disease)    with stenting X 4 in [place]  . Depression   . Hyperlipidemia   . Hypertension   . Macular degeneration   . Mild cognitive impairment 01/07/2016   Past Surgical History:  Procedure Laterality Date  . APPENDECTOMY    . CHOLECYSTECTOMY    . TONSILLECTOMY     Social History  Substance Use Topics  . Smoking status: Never Smoker  . Smokeless tobacco: Never Used  . Alcohol use Yes     Comment: 2 daily   family history includes Depression in his mother; Diabetes in his mother; Hyperlipidemia in his mother; Hypertension in his mother; Other in his brother, mother, and sister.  ROS as above:  Medications: Current Outpatient Prescriptions  Medication Sig Dispense Refill  . albuterol (PROVENTIL HFA;VENTOLIN HFA) 108 (90 Base) MCG/ACT inhaler Inhale 1-2 puffs into the lungs every 6 (six) hours as needed for wheezing or shortness of breath. 1 Inhaler 1  . AMBULATORY NON FORMULARY MEDICATION Stair Lift at home for use as needed. 1 each 0  . AMBULATORY NON FORMULARY MEDICATION Continuous positive airway pressure (CPAP) machine auto-titrate from 4-20 cm of H2O pressure, with all supplemental supplies as needed. 1 each 0  . atorvastatin (LIPITOR) 80 MG tablet Take 40 mg by mouth daily.     [name] BRILINTA 90 MG TABS tablet TAKE ONE TABLET BY MOUTH 2 TIMES  A DAY 56 tablet 0  . cetirizine (ZYRTEC) 10 MG tablet Take 1 tablet (10 mg total) by mouth daily. 90 tablet 1  . clobetasol cream (TEMOVATE) 6.14 % Apply 1 application topically 2 (two) times daily. 30 g 0  . doxazosin (CARDURA) 4 MG tablet TAKE ONE TABLET BY MOUTH AT BEDTIME 28 tablet 0  . furosemide (LASIX) 20 MG tablet TAKE ONE TABLET BY MOUTH 2 TIMES A DAY. (AM & NOON) 56 tablet 0  . QC LO-DOSE ASPIRIN 81 MG EC tablet TAKE ONE TABLET BY MOUTH EVERY DAY 28 tablet 0  . sertraline (ZOLOFT) 100 MG tablet Take 1 tablet (100 mg total) by mouth daily. 30 tablet 1  . vitamin B-12 (CYANOCOBALAMIN) 1000 MCG tablet Take one tablet by mouth daily.  28 tablet 5  . ipratropium-albuterol (DUONEB) 0.5-2.5 (3) MG/3ML SOLN Take 3 mLs by nebulization every 6 (six) hours as needed. 360 mL 1   No current facility-administered medications for this visit.    Allergies  Allergen Reactions  . Amlodipine Itching  . Carvedilol Itching    Health Maintenance Health Maintenance  Topic Date Due  . PNA vac Low Risk Adult (2 of 2 - PPSV23) 11/17/2015  . INFLUENZA VACCINE  08/02/2016  . TETANUS/TDAP  07/26/2024     Exam:  BP (!) 183/67   Pulse 69   Temp 98 F (36.7 C) (Oral)   Wt 220 lb (99.8 kg)   SpO2 95%   BMI 34.46  kg/m  Gen: Well NAD HEENT: EOMI,  MMM Lungs: Normal work of breathing. CTABL Heart: RRR no MRG Abd: NABS, Soft. Nondistended, Nontender Exts: Brisk capillary refill, warm and well perfused.   Two-view chest x-ray: No obvious infiltrate. Scarring present. Awaiting formal radiology review  No results found for this or any previous visit (from the past 72 hour(s)). No results found.    Assessment and Plan: 83 y.o. male with shortness of breath and wheezing likely COPD or strict of lung disease. Patient is not doing as well as I would like. Plan to broaden workup to include fluid overload as well as to follow up kidney function. Additionally well use DuoNeb nebulizer treatments as  this will likely help more than albuterol. Lastly refer to pulmonology is a think patient could benefit from a further workup.  Influenza vaccine and pneumonia vaccine given prior to discharge   Orders Placed This Encounter  Procedures  . DG Chest 2 View    Order Specific Question:   Reason for exam:    Answer:   Cough, assess intra-thoracic pathology    Order Specific Question:   Preferred imaging location?    Answer:   [facility]  . Flu Vaccine QUAD 36+ mos IM  . Pneumococcal polysaccharide vaccine 23-valent greater than or equal to 2yo subcutaneous/IM  . CBC  . COMPLETE METABOLIC PANEL WITH GFR  . B Nat Peptide  . Ambulatory referral to Pulmonology    Referral Priority:   Routine    Referral Type:   Consultation    Referral Reason:   Specialty Services Required    Requested Specialty:   Pulmonary Disease    Number of Visits Requested:   1   Meds ordered this encounter  Medications  . ipratropium-albuterol (DUONEB) 0.5-2.5 (3) MG/3ML SOLN    Sig: Take 3 mLs by nebulization every 6 (six) hours as needed.    Dispense:  360 mL    Refill:  1     Discussed warning signs or symptoms. Please see discharge instructions. Patient expresses understanding.

## 2016-09-11 NOTE — Patient Instructions (Signed)
Thank you for coming in today. You should hear from Pulmonology soon.  Get labs and xray today.  Use duoneb nebulizer every 6 hours as needed.  I recommend at least twice daily.   Schedule an excision for the bump on the left forearm soon.   Recheck with me in 1 month or sooner if needed.   Call or go to the emergency room if you get worse, have trouble breathing, have chest pains, or palpitations.     Bronchospasm, Adult Bronchospasm is a tightening of the airways going into the lungs. During an episode, it may be harder to breathe. You may cough, and you may make a whistling sound when you breathe (wheeze). This condition often affects people with asthma. What are the causes? This condition is caused by swelling and irritation in the airways. It can be triggered by:  An infection (common).  Seasonal allergies.  An allergic reaction.  Exercise.  Irritants. These include pollution, cigarette smoke, strong odors, aerosol sprays, and paint fumes.  Weather changes. Winds increase molds and pollens in the air. Cold air may cause swelling.  Stress and emotional upset.  What are the signs or symptoms? Symptoms of this condition include:  Wheezing. If the episode was triggered by an allergy, wheezing may start right away or hours later.  Nighttime coughing.  Frequent or severe coughing with a simple cold.  Chest tightness.  Shortness of breath.  Decreased ability to exercise.  How is this diagnosed? This condition is usually diagnosed with a review of your medical history and a physical exam. Tests, such as lung function tests, are sometimes done to look for other conditions. The need for a chest X-ray depends on where the wheezing occurs and whether it is the first time you have wheezed. How is this treated? This condition may be treated with:  Inhaled medicines. These open up the airways and help you breathe. They can be taken with an inhaler or a nebulizer  device.  Corticosteroid medicines. These may be given for severe bronchospasm, usually when it is associated with asthma.  Avoiding triggers, such as irritants, infection, or allergies.  Follow these instructions at home: Medicines  Take over-the-counter and prescription medicines only as told by your health care provider.  If you need to use an inhaler or nebulizer to take your medicine, ask your health care provider to explain how to use it correctly. If you were given a spacer, always use it with your inhaler. Lifestyle  Reduce the number of triggers in your home. To do this: ? Change your heating and air conditioning filter at least once a month. ? Limit your use of fireplaces and wood stoves. ? Do not smoke. Do not allow smoking in your home. ? Avoid using perfumes and fragrances. ? Get rid of pests, such as roaches and mice, and their droppings. ? Remove any mold from your home. ? Keep your house clean and dust free. Use unscented cleaning products. ? Replace carpet with wood, tile, or vinyl flooring. Carpet can trap dander and dust. ? Use allergy-proof pillows, mattress covers, and box spring covers. ? Wash bed sheets and blankets every week in hot water. Dry them in a dryer. ? Use blankets that are made of polyester or cotton. ? Wash your hands often. ? Do not allow pets in your bedroom.  Avoid breathing in cold air when you exercise. General instructions  Have a plan for seeking medical care. Know when to call your health care provider  and local emergency services, and where to get emergency care.  Stay up to date on your immunizations.  When you have an episode of bronchospasm, stay calm. Try to relax and breathe more slowly.  If you have asthma, make sure you have an asthma action plan.  Keep all follow-up visits as told by your health care provider. This is important. Contact a health care provider if:  You have muscle aches.  You have chest pain.  The mucus  that you cough up (sputum) changes from clear or white to yellow, green, gray, or bloody.  You have a fever.  Your sputum gets thicker. Get help right away if:  Your wheezing and coughing get worse, even after you take your prescribed medicines.  It gets even harder to breathe.  You develop severe chest pain. Summary  Bronchospasm is a tightening of the airways going into the lungs.  During an episode of bronchospasm, you may have a harder time breathing. You may cough and make a whistling sound when you breathe (wheeze).  Avoid exposure to triggers such as smoke, dust, mold, animal dander, and fragrances.  When you have an episode of bronchospasm, stay calm. Try to relax and breathe more slowly. This information is not intended to replace advice given to you by your health care provider. Make sure you discuss any questions you have with your health care provider. Document Released: 12/22/2002 Document Revised: 12/16/2015 Document Reviewed: 12/16/2015 Elsevier Interactive Patient Education  2017 [employer].

## 2016-09-12 LAB — BRAIN NATRIURETIC PEPTIDE: Brain Natriuretic Peptide: 84 pg/mL (ref <100)

## 2016-09-12 LAB — CBC
HCT: 41.7 % (ref 38.5–50.0)
Hemoglobin: 13.8 g/dL (ref 13.2–17.1)
MCH: 30 pg (ref 27.0–33.0)
MCHC: 33.1 g/dL (ref 32.0–36.0)
MCV: 90.7 fL (ref 80.0–100.0)
MPV: 9.1 fL (ref 7.5–12.5)
Platelets: 155 10*3/uL (ref 140–400)
RBC: 4.6 10*6/uL (ref 4.20–5.80)
RDW: 13.2 % (ref 11.0–15.0)
WBC: 9.8 10*3/uL (ref 3.8–10.8)

## 2016-09-12 LAB — COMPLETE METABOLIC PANEL WITH GFR
AG Ratio: 1.3 (calc) (ref 1.0–2.5)
ALBUMIN MSPROF: 3.5 g/dL — AB (ref 3.6–5.1)
ALKALINE PHOSPHATASE (APISO): 79 U/L (ref 40–115)
ALT: 15 U/L (ref 9–46)
AST: 13 U/L (ref 10–35)
BILIRUBIN TOTAL: 0.7 mg/dL (ref 0.2–1.2)
BUN: 21 mg/dL (ref 7–25)
CHLORIDE: 108 mmol/L (ref 98–110)
CO2: 29 mmol/L (ref 20–32)
Calcium: 8.6 mg/dL (ref 8.6–10.3)
Creat: 1.09 mg/dL (ref 0.70–1.11)
GFR, Est African American: 73 mL/min/{1.73_m2} (ref >=60)
GFR, Est Non African American: 63 mL/min/{1.73_m2} (ref >=60)
GLUCOSE: 168 mg/dL — AB (ref 65–99)
Globulin: 2.6 g/dL (calc) (ref 1.9–3.7)
Potassium: 4.1 mmol/L (ref 3.5–5.3)
SODIUM: 143 mmol/L (ref 135–146)
Total Protein: 6.1 g/dL (ref 6.1–8.1)

## 2016-09-22 DIAGNOSIS — G4733 Obstructive sleep apnea (adult) (pediatric): Secondary | ICD-10-CM | POA: Diagnosis not present

## 2016-09-22 DIAGNOSIS — R062 Wheezing: Secondary | ICD-10-CM | POA: Diagnosis not present

## 2016-09-22 DIAGNOSIS — I252 Old myocardial infarction: Secondary | ICD-10-CM | POA: Diagnosis not present

## 2016-09-22 DIAGNOSIS — I2510 Atherosclerotic heart disease of native coronary artery without angina pectoris: Secondary | ICD-10-CM | POA: Diagnosis not present

## 2016-09-22 DIAGNOSIS — I10 Essential (primary) hypertension: Secondary | ICD-10-CM | POA: Diagnosis not present

## 2016-09-25 ENCOUNTER — Other Ambulatory Visit: Payer: Self-pay | Admitting: Physician Assistant

## 2016-09-25 DIAGNOSIS — F3341 Major depressive disorder, recurrent, in partial remission: Secondary | ICD-10-CM

## 2016-09-26 DIAGNOSIS — I252 Old myocardial infarction: Secondary | ICD-10-CM | POA: Diagnosis not present

## 2016-09-26 DIAGNOSIS — E785 Hyperlipidemia, unspecified: Secondary | ICD-10-CM | POA: Diagnosis not present

## 2016-09-26 DIAGNOSIS — I5189 Other ill-defined heart diseases: Secondary | ICD-10-CM | POA: Diagnosis not present

## 2016-09-26 DIAGNOSIS — I4891 Unspecified atrial fibrillation: Secondary | ICD-10-CM | POA: Diagnosis not present

## 2016-09-26 DIAGNOSIS — R0602 Shortness of breath: Secondary | ICD-10-CM | POA: Diagnosis not present

## 2016-09-26 DIAGNOSIS — R002 Palpitations: Secondary | ICD-10-CM | POA: Diagnosis not present

## 2016-09-26 DIAGNOSIS — I5032 Chronic diastolic (congestive) heart failure: Secondary | ICD-10-CM | POA: Diagnosis not present

## 2016-09-26 DIAGNOSIS — E669 Obesity, unspecified: Secondary | ICD-10-CM | POA: Diagnosis present

## 2016-09-26 DIAGNOSIS — I639 Cerebral infarction, unspecified: Secondary | ICD-10-CM | POA: Diagnosis not present

## 2016-09-26 DIAGNOSIS — R001 Bradycardia, unspecified: Secondary | ICD-10-CM | POA: Diagnosis not present

## 2016-09-26 DIAGNOSIS — Z8673 Personal history of transient ischemic attack (TIA), and cerebral infarction without residual deficits: Secondary | ICD-10-CM | POA: Diagnosis not present

## 2016-09-26 DIAGNOSIS — J440 Chronic obstructive pulmonary disease with acute lower respiratory infection: Secondary | ICD-10-CM | POA: Diagnosis not present

## 2016-09-26 DIAGNOSIS — F329 Major depressive disorder, single episode, unspecified: Secondary | ICD-10-CM | POA: Diagnosis not present

## 2016-09-26 DIAGNOSIS — N183 Chronic kidney disease, stage 3 (moderate): Secondary | ICD-10-CM | POA: Diagnosis not present

## 2016-09-26 DIAGNOSIS — Z6833 Body mass index (BMI) 33.0-33.9, adult: Secondary | ICD-10-CM | POA: Diagnosis not present

## 2016-09-26 DIAGNOSIS — R7989 Other specified abnormal findings of blood chemistry: Secondary | ICD-10-CM | POA: Diagnosis not present

## 2016-09-26 DIAGNOSIS — R05 Cough: Secondary | ICD-10-CM | POA: Diagnosis not present

## 2016-09-26 DIAGNOSIS — J209 Acute bronchitis, unspecified: Secondary | ICD-10-CM | POA: Diagnosis present

## 2016-09-26 DIAGNOSIS — R600 Localized edema: Secondary | ICD-10-CM | POA: Diagnosis not present

## 2016-09-26 DIAGNOSIS — I517 Cardiomegaly: Secondary | ICD-10-CM | POA: Diagnosis not present

## 2016-09-26 DIAGNOSIS — J9811 Atelectasis: Secondary | ICD-10-CM | POA: Diagnosis not present

## 2016-09-26 DIAGNOSIS — I2510 Atherosclerotic heart disease of native coronary artery without angina pectoris: Secondary | ICD-10-CM | POA: Diagnosis not present

## 2016-09-26 DIAGNOSIS — J441 Chronic obstructive pulmonary disease with (acute) exacerbation: Secondary | ICD-10-CM | POA: Diagnosis not present

## 2016-09-26 DIAGNOSIS — Z888 Allergy status to other drugs, medicaments and biological substances status: Secondary | ICD-10-CM | POA: Diagnosis not present

## 2016-09-26 DIAGNOSIS — I10 Essential (primary) hypertension: Secondary | ICD-10-CM | POA: Diagnosis not present

## 2016-09-26 DIAGNOSIS — Z79899 Other long term (current) drug therapy: Secondary | ICD-10-CM | POA: Diagnosis not present

## 2016-09-26 DIAGNOSIS — R918 Other nonspecific abnormal finding of lung field: Secondary | ICD-10-CM | POA: Diagnosis not present

## 2016-09-26 DIAGNOSIS — I480 Paroxysmal atrial fibrillation: Secondary | ICD-10-CM | POA: Diagnosis not present

## 2016-09-26 DIAGNOSIS — I130 Hypertensive heart and chronic kidney disease with heart failure and stage 1 through stage 4 chronic kidney disease, or unspecified chronic kidney disease: Secondary | ICD-10-CM | POA: Diagnosis not present

## 2016-09-26 DIAGNOSIS — R748 Abnormal levels of other serum enzymes: Secondary | ICD-10-CM | POA: Diagnosis not present

## 2016-09-26 DIAGNOSIS — I5020 Unspecified systolic (congestive) heart failure: Secondary | ICD-10-CM | POA: Diagnosis not present

## 2016-09-26 DIAGNOSIS — Z955 Presence of coronary angioplasty implant and graft: Secondary | ICD-10-CM | POA: Diagnosis not present

## 2016-09-26 DIAGNOSIS — G4733 Obstructive sleep apnea (adult) (pediatric): Secondary | ICD-10-CM | POA: Diagnosis not present

## 2016-09-27 ENCOUNTER — Other Ambulatory Visit: Payer: Self-pay | Admitting: Family Medicine

## 2016-09-27 DIAGNOSIS — I10 Essential (primary) hypertension: Secondary | ICD-10-CM

## 2016-09-27 DIAGNOSIS — I2510 Atherosclerotic heart disease of native coronary artery without angina pectoris: Secondary | ICD-10-CM

## 2016-10-03 ENCOUNTER — Ambulatory Visit (INDEPENDENT_AMBULATORY_CARE_PROVIDER_SITE_OTHER): Payer: Medicare Other | Admitting: Family Medicine

## 2016-10-03 VITALS — BP 132/65 | HR 98 | Temp 98.3°F | Wt 206.0 lb

## 2016-10-03 DIAGNOSIS — N183 Chronic kidney disease, stage 3 (moderate): Secondary | ICD-10-CM | POA: Diagnosis not present

## 2016-10-03 DIAGNOSIS — I480 Paroxysmal atrial fibrillation: Secondary | ICD-10-CM

## 2016-10-03 DIAGNOSIS — I2510 Atherosclerotic heart disease of native coronary artery without angina pectoris: Secondary | ICD-10-CM

## 2016-10-03 DIAGNOSIS — Z955 Presence of coronary angioplasty implant and graft: Secondary | ICD-10-CM | POA: Diagnosis not present

## 2016-10-03 DIAGNOSIS — I10 Essential (primary) hypertension: Secondary | ICD-10-CM | POA: Diagnosis not present

## 2016-10-03 DIAGNOSIS — G4733 Obstructive sleep apnea (adult) (pediatric): Secondary | ICD-10-CM | POA: Diagnosis not present

## 2016-10-03 DIAGNOSIS — I5032 Chronic diastolic (congestive) heart failure: Secondary | ICD-10-CM | POA: Diagnosis not present

## 2016-10-03 DIAGNOSIS — I4891 Unspecified atrial fibrillation: Secondary | ICD-10-CM | POA: Insufficient documentation

## 2016-10-03 DIAGNOSIS — I252 Old myocardial infarction: Secondary | ICD-10-CM | POA: Diagnosis not present

## 2016-10-03 DIAGNOSIS — R059 Cough, unspecified: Secondary | ICD-10-CM

## 2016-10-03 DIAGNOSIS — R9431 Abnormal electrocardiogram [ECG] [EKG]: Secondary | ICD-10-CM | POA: Diagnosis not present

## 2016-10-03 DIAGNOSIS — R05 Cough: Secondary | ICD-10-CM

## 2016-10-03 MED ORDER — GUAIFENESIN-CODEINE 100-10 MG/5ML PO SOLN: 5.0000 mL | Freq: Every evening | ORAL | 1 refills | Status: DC | PRN

## 2016-10-03 MED ORDER — PREDNISONE 10 MG PO TABS: 30.0000 mg | ORAL_TABLET | Freq: Every day | ORAL | 1 refills | Status: DC

## 2016-10-03 MED ORDER — ALBUTEROL SULFATE (2.5 MG/3ML) 0.083% IN NEBU: 2.5000 mg | INHALATION_SOLUTION | Freq: Four times a day (QID) | RESPIRATORY_TRACT | 1 refills | Status: AC | PRN

## 2016-10-03 NOTE — Progress Notes (Signed)
Carlos Harrington is a 83 y.o. male who presents to [facility]: [facility] today for hospital follow-up. Carlos Harrington was recently seen in the hospital for atrial fibrillation with RVR. Mother several medications were changed. He was started on Eliquis for prophylaxis. Since his hospitalization he's been seen by cardiology where the Coreg that was started during the hospitalization was discontinued due to hypotension. Prior to and following the hospitalization Carlos Harrington has had significant cough and shortness of breath. He is currently scheduled to see the pulmonologist sometime next week. He remains quite symptomatic with severe cough and wheezing and shortness of breath. He is quite symptomatic. The cough makes it difficult for him to sleep.   Past Medical History:  Diagnosis Date  . Anxiety   . CAD (coronary artery disease)    with stenting X 4 in [place]  . Depression   . Hyperlipidemia   . Hypertension   . Macular degeneration   . Mild cognitive impairment 01/07/2016   Past Surgical History:  Procedure Laterality Date  . APPENDECTOMY    . CHOLECYSTECTOMY    . TONSILLECTOMY     Social History  Substance Use Topics  . Smoking status: Never Smoker  . Smokeless tobacco: Never Used  . Alcohol use Yes     Comment: 2 daily   family history includes Depression in his mother; Diabetes in his mother; Hyperlipidemia in his mother; Hypertension in his mother; Other in his brother, mother, and sister.  ROS as above:  Medications: Current Outpatient Prescriptions  Medication Sig Dispense Refill  . albuterol (PROVENTIL HFA;VENTOLIN HFA) 108 (90 Base) MCG/ACT inhaler Inhale 1-2 puffs into the lungs every 6 (six) hours as needed for wheezing or shortness of breath. 1 Inhaler 1  . albuterol (PROVENTIL) (2.5 MG/3ML) 0.083% nebulizer solution Take 3 mLs (2.5 mg total) by nebulization every 6 (six) hours as needed  for wheezing or shortness of breath. 150 mL 1  . AMBULATORY NON FORMULARY MEDICATION Stair Lift at home for use as needed. 1 each 0  . AMBULATORY NON FORMULARY MEDICATION Continuous positive airway pressure (CPAP) machine auto-titrate from 4-20 cm of H2O pressure, with all supplemental supplies as needed. 1 each 0  . atorvastatin (LIPITOR) 40 MG tablet Take 40 mg by mouth daily.  11  . cetirizine (ZYRTEC) 10 MG tablet Take 1 tablet (10 mg total) by mouth daily. 90 tablet 1  . clobetasol cream (TEMOVATE) 5.28 % Apply 1 application topically 2 (two) times daily. 30 g 0  . doxazosin (CARDURA) 4 MG tablet TAKE ONE TABLET BY MOUTH AT BEDTIME 28 tablet 1  . furosemide (LASIX) 20 MG tablet TAKE ONE TABLET BY MOUTH 2 TIMES A DAY. (AM & NOON) 56 tablet 1  . guaiFENesin-codeine 100-10 MG/5ML syrup Take 5 mLs by mouth at bedtime as needed for cough. 236 mL 1  . ipratropium-albuterol (DUONEB) 0.5-2.5 (3) MG/3ML SOLN Take 3 mLs by nebulization every 6 (six) hours as needed. 360 mL 1  . predniSONE (DELTASONE) 10 MG tablet Take 3 tablets (30 mg total) by mouth daily with breakfast. 30 tablet 1  . QC LO-DOSE ASPIRIN 81 MG EC tablet TAKE ONE TABLET BY MOUTH EVERY DAY 30 tablet 1  . sertraline (ZOLOFT) 100 MG tablet TAKE ONE TABLET BY MOUTH EVERY DAY 30 tablet 0  . vitamin B-12 (CYANOCOBALAMIN) 1000 MCG tablet Take one tablet by mouth daily.  28 tablet 5   No current facility-administered medications for this visit.  Allergies  Allergen Reactions  . Amlodipine Itching  . Carvedilol Itching    Health Maintenance Health Maintenance  Topic Date Due  . TETANUS/TDAP  07/26/2024  . INFLUENZA VACCINE  Completed  . PNA vac Low Risk Adult  Completed     Exam:  BP 132/65   Pulse 98   Temp 98.3 F (36.8 C)   Wt 206 lb (93.4 kg)   SpO2 91%   BMI 32.26 kg/m  Gen: Well NAD HEENT: EOMI,  MMM Lungs: Increased work of breathing. Coarse breath sounds with wheezing present bilaterally Heart: RRR no  MRG Abd: NABS, Soft. Nondistended, Nontender Exts: Brisk capillary refill, warm and well perfused.    No results found for this or any previous visit (from the past 72 hour(s)). No results found.    Assessment and Plan: 83 y.o. male with  Atrial fibrillation with RVR. Doing pretty well overall. I think is probably a good idea to not use Coreg. I'm concerned that the nonselective beta blocker will worsen his pulmonary disease. I think metoprolol is a better choice. Defer to cardiology.  Pulmonary disease: This remains Carlos Harrington as main medical problem. I'm deeply concerned that he is overall doing poorly. Plan to start chronic steroids as I think at this point that's probably the best option. Follow-up with pulmonology in the near future. Recheck with me in one week. Prescribe codeine cough syrup for cough suppression.  Goals of care. If Carlos Harrington does not start getting better as think hospice is probably a good idea. I will bring this up when a bit more stable.    No orders of the defined types were placed in this encounter.  Meds ordered this encounter  Medications  . atorvastatin (LIPITOR) 40 MG tablet    Sig: Take 40 mg by mouth daily.    Refill:  11  . guaiFENesin-codeine 100-10 MG/5ML syrup    Sig: Take 5 mLs by mouth at bedtime as needed for cough.    Dispense:  236 mL    Refill:  1  . predniSONE (DELTASONE) 10 MG tablet    Sig: Take 3 tablets (30 mg total) by mouth daily with breakfast.    Dispense:  30 tablet    Refill:  1  . albuterol (PROVENTIL) (2.5 MG/3ML) 0.083% nebulizer solution    Sig: Take 3 mLs (2.5 mg total) by nebulization every 6 (six) hours as needed for wheezing or shortness of breath.    Dispense:  150 mL    Refill:  1     Discussed warning signs or symptoms. Please see discharge instructions. Patient expresses understanding.

## 2016-10-03 NOTE — Patient Instructions (Addendum)
Thank you for coming in today. Start prednisone daily.  Use the cough medicine at night as needed.  Continue nebulizer.  Recheck with me in 1 week.  Return sooner if needed.

## 2016-10-04 NOTE — Progress Notes (Signed)
Note sent to requested recipient via epic.

## 2016-10-10 ENCOUNTER — Encounter: Payer: Self-pay | Admitting: Family Medicine

## 2016-10-10 ENCOUNTER — Ambulatory Visit (INDEPENDENT_AMBULATORY_CARE_PROVIDER_SITE_OTHER): Payer: Medicare Other | Admitting: Family Medicine

## 2016-10-10 VITALS — BP 149/72 | HR 83 | Temp 97.8°F | Wt 208.0 lb

## 2016-10-10 DIAGNOSIS — J9809 Other diseases of bronchus, not elsewhere classified: Secondary | ICD-10-CM | POA: Diagnosis not present

## 2016-10-10 DIAGNOSIS — I2510 Atherosclerotic heart disease of native coronary artery without angina pectoris: Secondary | ICD-10-CM

## 2016-10-10 DIAGNOSIS — C44629 Squamous cell carcinoma of skin of left upper limb, including shoulder: Secondary | ICD-10-CM | POA: Diagnosis not present

## 2016-10-10 DIAGNOSIS — L989 Disorder of the skin and subcutaneous tissue, unspecified: Secondary | ICD-10-CM | POA: Diagnosis not present

## 2016-10-10 NOTE — Patient Instructions (Signed)
Thank you for coming in today. Continue steroids.  Return for suture removal in 7-10 days.  Recheck in 1 month with me otherwise.  At that point you will have had follow up with pulmonology and cardiology.  I will get results to you ASAP.   Pyogenic Granuloma Pyogenic granuloma is a growth (lesion) that forms on the skin or on the mucous membranes of the mouth. This type of growth is a lump of very red tissue that bleeds easily. A pyogenic granuloma is usually a single lesion that most often affects:  The head and neck.  The mucous membranes of the mouth or tongue.  The upper body.  The hands and feet.  A pyogenic granuloma usually measures about 0.5 inch (1.3 cm), but lesions can be smaller or larger. This condition does not spread from person to person (is not contagious). The lesion is not cancerous (benign). What are the causes? A pyogenic granuloma results from a reaction of your skin or mucous membranes. The reaction causes a mound of tiny blood vessels (capillaries) to form a lesion. This often happens after a minor injury, like pricking your skin or biting your lip or tongue. Sometimes it occurs without an injury. The exact cause of the reaction is not known. What increases the risk? The condition is more likely to develop in:  Pregnant women.  Children and young adults.  People who take certain medicines, especially acne treatment drugs, birth control pills, and some medicines used to treat cancer or HIV/AIDS.  What are the signs or symptoms? The main symptom of this condition is a raised or lumpy lesion that is very red. The lesion may also:  Have a crusty, ulcerated surface.  Bleed easily.  Be slightly sore.  How is this diagnosed? This condition is diagnosed based on your symptoms and medical history, especially if you recently had an injury. Your health care provider will also do a physical exam. Your health care provider may remove a small piece of the granuloma  for testing (biopsy) to rule out cancer. How is this treated? A small lesion may go away without treatment. You may have to stop or change any medicines that caused the lesion. Pyogenic granulomas caused by pregnancy usually go away after delivery. If your legion is large, irritated, or bleeds easily, you may need to have the lesion removed. This may involve:  Scraping away the lesion (curettage).  Using chemicals or electric energy to destroy the lesion.  Removing the lesion along with a small piece of normal skin or mucous membrane (surgical excision). This is the best treatment to prevent the lesion from coming back.  Follow these instructions at home:  Take over-the-counter and prescription medicines only as told by your health care provider.  Keep all follow-up visits as told by your health care provider. This is important. Contact a health care provider if:  You have a fever.  Your lesion bleeds.  Your lesion comes back after treatment. This information is not intended to replace advice given to you by your health care provider. Make sure you discuss any questions you have with your health care provider. Document Released: 01/03/2015 Document Revised: 05/27/2015 Document Reviewed: 01/03/2015 Elsevier Interactive Patient Education  2018 [employer].

## 2016-10-10 NOTE — Progress Notes (Signed)
Carlos Harrington is a 83 y.o. male who presents to [place]: [place] today for follow up cough and discuss lesion on left forearm.   Cough: Patient was seen a week ago for cough and wheezing. He was started on oral steroids, given codeine-based cough syrup and referred to pulmonology. In the interim his symptoms have considerably improved. He denies chest pain or palpitations. He has a follow-up appoint with both cardiology and pulmonology within the next month.  Left Forearm Lesion: [name] has had a growing lesion on his left forearm present for a few months. The biopsy was deferred due to Carlos Harrington's multiple medical problems.  The skin lesion was thought to be likely pageant and granuloma. It does not bother him much at all. No fevers or chills.   Past Medical History:  Diagnosis Date  . Anxiety   . CAD (coronary artery disease)    with stenting X 4 in [place]  . Depression   . Hyperlipidemia   . Hypertension   . Macular degeneration   . Mild cognitive impairment 01/07/2016   Past Surgical History:  Procedure Laterality Date  . APPENDECTOMY    . CHOLECYSTECTOMY    . TONSILLECTOMY     Social History  Substance Use Topics  . Smoking status: Never Smoker  . Smokeless tobacco: Never Used  . Alcohol use Yes     Comment: 2 daily   family history includes Depression in his mother; Diabetes in his mother; Hyperlipidemia in his mother; Hypertension in his mother; Other in his brother, mother, and sister.  ROS as above:  Medications: Current Outpatient Prescriptions  Medication Sig Dispense Refill  . albuterol (PROVENTIL HFA;VENTOLIN HFA) 108 (90 Base) MCG/ACT inhaler Inhale 1-2 puffs into the lungs every 6 (six) hours as needed for wheezing or shortness of breath. 1 Inhaler 1  . albuterol (PROVENTIL) (2.5 MG/3ML) 0.083% nebulizer solution Take 3 mLs (2.5 mg total) by nebulization every 6  (six) hours as needed for wheezing or shortness of breath. 150 mL 1  . AMBULATORY NON FORMULARY MEDICATION Stair Lift at home for use as needed. 1 each 0  . AMBULATORY NON FORMULARY MEDICATION Continuous positive airway pressure (CPAP) machine auto-titrate from 4-20 cm of H2O pressure, with all supplemental supplies as needed. 1 each 0  . atorvastatin (LIPITOR) 40 MG tablet Take 40 mg by mouth daily.  11  . cetirizine (ZYRTEC) 10 MG tablet Take 1 tablet (10 mg total) by mouth daily. 90 tablet 1  . clobetasol cream (TEMOVATE) 2.59 % Apply 1 application topically 2 (two) times daily. 30 g 0  . doxazosin (CARDURA) 4 MG tablet TAKE ONE TABLET BY MOUTH AT BEDTIME 28 tablet 1  . furosemide (LASIX) 20 MG tablet TAKE ONE TABLET BY MOUTH 2 TIMES A DAY. (AM & NOON) 56 tablet 1  . guaiFENesin-codeine 100-10 MG/5ML syrup Take 5 mLs by mouth at bedtime as needed for cough. 236 mL 1  . ipratropium-albuterol (DUONEB) 0.5-2.5 (3) MG/3ML SOLN Take 3 mLs by nebulization every 6 (six) hours as needed. 360 mL 1  . predniSONE (DELTASONE) 10 MG tablet Take 3 tablets (30 mg total) by mouth daily with breakfast. 30 tablet 1  . QC LO-DOSE ASPIRIN 81 MG EC tablet TAKE ONE TABLET BY MOUTH EVERY DAY 30 tablet 1  . sertraline (ZOLOFT) 100 MG tablet TAKE ONE TABLET BY MOUTH EVERY DAY 30 tablet 0  . vitamin B-12 (CYANOCOBALAMIN) 1000 MCG tablet Take one  tablet by mouth daily.  28 tablet 5   No current facility-administered medications for this visit.    Allergies  Allergen Reactions  . Amlodipine Itching  . Carvedilol Itching    Health Maintenance Health Maintenance  Topic Date Due  . TETANUS/TDAP  07/26/2024  . INFLUENZA VACCINE  Completed  . PNA vac Low Risk Adult  Completed     Exam:  BP (!) 149/72   Pulse 83   Temp 97.8 F (36.6 C) (Oral)   Wt 208 lb (94.3 kg)   BMI 32.58 kg/m  Gen: Well NAD HEENT: EOMI,  MMM Lungs: Normal work of breathing. Coarse breath sounds throughout Heart: RRR no MRG Abd:  NABS, Soft. Nondistended, Nontender Exts: Brisk capillary refill, warm and well perfused.  Skin lesion left forearm approximately >25 mm umbilicated red large papule. Nontender.  Excisional biopsy. Consent obtained and timeout performed. Skin cleaned with alcohol and cold spray applied. 3 mL of lidocaine with epinephrine injected achieving good anesthesia. Skin was cleaned with chlorhexidine and the planned excisional biopsy was marked with a sterile skin marker. The skin was prepped and draped in usual sterile fashion. An elliptical excision along the skin tension lines was used to undermine the lesion to the fat layer. Approximately 2 mm margins were used. 3 horizontal mattress and one simple interrupted sutures were used to close the wound. Normal bleeding. Patient tolerated the procedure well.   No results found for this or any previous visit (from the past 72 hour(s)). No results found.    Assessment and Plan: 83 y.o. 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 Skin pathology pending. Plan to return in 7-10 days for suture removal and recheck.  Cough: Likely underlying lung disease. Plan to follow-up with pulmonology and continue oral steroids. Recheck with me in about a month.   No orders of the defined types were placed in this encounter.  No orders of the defined types were placed in this encounter.    Discussed warning signs or symptoms. Please see discharge instructions. Patient expresses understanding.

## 2016-10-14 DIAGNOSIS — Z8673 Personal history of transient ischemic attack (TIA), and cerebral infarction without residual deficits: Secondary | ICD-10-CM | POA: Diagnosis not present

## 2016-10-14 DIAGNOSIS — G9340 Encephalopathy, unspecified: Secondary | ICD-10-CM | POA: Diagnosis not present

## 2016-10-14 DIAGNOSIS — R0600 Dyspnea, unspecified: Secondary | ICD-10-CM | POA: Diagnosis not present

## 2016-10-14 DIAGNOSIS — J189 Pneumonia, unspecified organism: Secondary | ICD-10-CM | POA: Diagnosis not present

## 2016-10-14 DIAGNOSIS — I080 Rheumatic disorders of both mitral and aortic valves: Secondary | ICD-10-CM | POA: Diagnosis not present

## 2016-10-14 DIAGNOSIS — R29898 Other symptoms and signs involving the musculoskeletal system: Secondary | ICD-10-CM | POA: Diagnosis not present

## 2016-10-14 DIAGNOSIS — Z9111 Patient's noncompliance with dietary regimen: Secondary | ICD-10-CM | POA: Diagnosis not present

## 2016-10-14 DIAGNOSIS — J9601 Acute respiratory failure with hypoxia: Secondary | ICD-10-CM | POA: Diagnosis not present

## 2016-10-14 DIAGNOSIS — N183 Chronic kidney disease, stage 3 (moderate): Secondary | ICD-10-CM | POA: Diagnosis not present

## 2016-10-14 DIAGNOSIS — G4733 Obstructive sleep apnea (adult) (pediatric): Secondary | ICD-10-CM | POA: Diagnosis not present

## 2016-10-14 DIAGNOSIS — R0902 Hypoxemia: Secondary | ICD-10-CM | POA: Diagnosis not present

## 2016-10-14 DIAGNOSIS — I5033 Acute on chronic diastolic (congestive) heart failure: Secondary | ICD-10-CM | POA: Diagnosis not present

## 2016-10-14 DIAGNOSIS — J811 Chronic pulmonary edema: Secondary | ICD-10-CM | POA: Diagnosis not present

## 2016-10-14 DIAGNOSIS — I480 Paroxysmal atrial fibrillation: Secondary | ICD-10-CM | POA: Diagnosis not present

## 2016-10-14 DIAGNOSIS — J40 Bronchitis, not specified as acute or chronic: Secondary | ICD-10-CM | POA: Diagnosis not present

## 2016-10-14 DIAGNOSIS — Z7902 Long term (current) use of antithrombotics/antiplatelets: Secondary | ICD-10-CM | POA: Diagnosis not present

## 2016-10-14 DIAGNOSIS — R918 Other nonspecific abnormal finding of lung field: Secondary | ICD-10-CM | POA: Diagnosis not present

## 2016-10-14 DIAGNOSIS — Z9114 Patient's other noncompliance with medication regimen: Secondary | ICD-10-CM | POA: Diagnosis not present

## 2016-10-14 DIAGNOSIS — T404X5A Adverse effect of other synthetic narcotics, initial encounter: Secondary | ICD-10-CM | POA: Diagnosis present

## 2016-10-14 DIAGNOSIS — R1310 Dysphagia, unspecified: Secondary | ICD-10-CM | POA: Diagnosis not present

## 2016-10-14 DIAGNOSIS — R51 Headache: Secondary | ICD-10-CM | POA: Diagnosis not present

## 2016-10-14 DIAGNOSIS — I10 Essential (primary) hypertension: Secondary | ICD-10-CM | POA: Diagnosis not present

## 2016-10-14 DIAGNOSIS — I252 Old myocardial infarction: Secondary | ICD-10-CM | POA: Diagnosis not present

## 2016-10-14 DIAGNOSIS — I472 Ventricular tachycardia: Secondary | ICD-10-CM | POA: Diagnosis not present

## 2016-10-14 DIAGNOSIS — I130 Hypertensive heart and chronic kidney disease with heart failure and stage 1 through stage 4 chronic kidney disease, or unspecified chronic kidney disease: Secondary | ICD-10-CM | POA: Diagnosis present

## 2016-10-14 DIAGNOSIS — J810 Acute pulmonary edema: Secondary | ICD-10-CM | POA: Diagnosis not present

## 2016-10-14 DIAGNOSIS — R05 Cough: Secondary | ICD-10-CM | POA: Diagnosis not present

## 2016-10-14 DIAGNOSIS — E785 Hyperlipidemia, unspecified: Secondary | ICD-10-CM | POA: Diagnosis present

## 2016-10-14 DIAGNOSIS — J9602 Acute respiratory failure with hypercapnia: Secondary | ICD-10-CM | POA: Diagnosis not present

## 2016-10-14 DIAGNOSIS — J90 Pleural effusion, not elsewhere classified: Secondary | ICD-10-CM | POA: Diagnosis not present

## 2016-10-14 DIAGNOSIS — J441 Chronic obstructive pulmonary disease with (acute) exacerbation: Secondary | ICD-10-CM | POA: Diagnosis present

## 2016-10-14 DIAGNOSIS — H833X3 Noise effects on inner ear, bilateral: Secondary | ICD-10-CM | POA: Diagnosis not present

## 2016-10-14 DIAGNOSIS — R600 Localized edema: Secondary | ICD-10-CM | POA: Diagnosis not present

## 2016-10-14 DIAGNOSIS — D696 Thrombocytopenia, unspecified: Secondary | ICD-10-CM | POA: Diagnosis not present

## 2016-10-14 DIAGNOSIS — I517 Cardiomegaly: Secondary | ICD-10-CM | POA: Diagnosis not present

## 2016-10-14 DIAGNOSIS — J440 Chronic obstructive pulmonary disease with acute lower respiratory infection: Secondary | ICD-10-CM | POA: Diagnosis not present

## 2016-10-14 DIAGNOSIS — I2510 Atherosclerotic heart disease of native coronary artery without angina pectoris: Secondary | ICD-10-CM | POA: Diagnosis not present

## 2016-10-14 DIAGNOSIS — E041 Nontoxic single thyroid nodule: Secondary | ICD-10-CM | POA: Diagnosis not present

## 2016-10-14 DIAGNOSIS — R0602 Shortness of breath: Secondary | ICD-10-CM | POA: Diagnosis not present

## 2016-10-14 DIAGNOSIS — I519 Heart disease, unspecified: Secondary | ICD-10-CM | POA: Diagnosis not present

## 2016-10-14 DIAGNOSIS — I4891 Unspecified atrial fibrillation: Secondary | ICD-10-CM | POA: Diagnosis not present

## 2016-10-14 DIAGNOSIS — R4182 Altered mental status, unspecified: Secondary | ICD-10-CM | POA: Diagnosis not present

## 2016-10-14 DIAGNOSIS — I429 Cardiomyopathy, unspecified: Secondary | ICD-10-CM | POA: Diagnosis present

## 2016-10-15 DIAGNOSIS — J189 Pneumonia, unspecified organism: Secondary | ICD-10-CM | POA: Insufficient documentation

## 2016-10-16 ENCOUNTER — Telehealth: Payer: Self-pay | Admitting: Family Medicine

## 2016-10-16 NOTE — Telephone Encounter (Signed)
Patient's daughter called adv that pt is in hospital not doing well... He has pulmonary Edema and Afib... Pt's pneumonia is getting worse. Cancelled pt's appt for Wednesday 10/18/16

## 2016-10-18 ENCOUNTER — Ambulatory Visit: Payer: Self-pay | Admitting: Family Medicine

## 2016-10-22 DIAGNOSIS — I4891 Unspecified atrial fibrillation: Secondary | ICD-10-CM | POA: Diagnosis not present

## 2016-10-22 DIAGNOSIS — Z951 Presence of aortocoronary bypass graft: Secondary | ICD-10-CM | POA: Diagnosis not present

## 2016-10-22 DIAGNOSIS — J449 Chronic obstructive pulmonary disease, unspecified: Secondary | ICD-10-CM | POA: Diagnosis not present

## 2016-10-22 DIAGNOSIS — J440 Chronic obstructive pulmonary disease with acute lower respiratory infection: Secondary | ICD-10-CM | POA: Diagnosis not present

## 2016-10-22 DIAGNOSIS — Z7982 Long term (current) use of aspirin: Secondary | ICD-10-CM | POA: Diagnosis not present

## 2016-10-22 DIAGNOSIS — I2510 Atherosclerotic heart disease of native coronary artery without angina pectoris: Secondary | ICD-10-CM | POA: Diagnosis not present

## 2016-10-22 DIAGNOSIS — H9190 Unspecified hearing loss, unspecified ear: Secondary | ICD-10-CM | POA: Diagnosis not present

## 2016-10-22 DIAGNOSIS — I5032 Chronic diastolic (congestive) heart failure: Secondary | ICD-10-CM | POA: Diagnosis not present

## 2016-10-22 DIAGNOSIS — I130 Hypertensive heart and chronic kidney disease with heart failure and stage 1 through stage 4 chronic kidney disease, or unspecified chronic kidney disease: Secondary | ICD-10-CM | POA: Diagnosis not present

## 2016-10-22 DIAGNOSIS — N183 Chronic kidney disease, stage 3 (moderate): Secondary | ICD-10-CM | POA: Diagnosis not present

## 2016-10-22 DIAGNOSIS — J189 Pneumonia, unspecified organism: Secondary | ICD-10-CM | POA: Diagnosis not present

## 2016-10-23 ENCOUNTER — Encounter: Payer: Self-pay | Admitting: Family Medicine

## 2016-10-23 ENCOUNTER — Ambulatory Visit (INDEPENDENT_AMBULATORY_CARE_PROVIDER_SITE_OTHER): Payer: Medicare Other | Admitting: Family Medicine

## 2016-10-23 VITALS — BP 113/72 | HR 103 | Temp 97.5°F | Wt 207.0 lb

## 2016-10-23 DIAGNOSIS — I130 Hypertensive heart and chronic kidney disease with heart failure and stage 1 through stage 4 chronic kidney disease, or unspecified chronic kidney disease: Secondary | ICD-10-CM | POA: Diagnosis not present

## 2016-10-23 DIAGNOSIS — J189 Pneumonia, unspecified organism: Secondary | ICD-10-CM

## 2016-10-23 DIAGNOSIS — I2510 Atherosclerotic heart disease of native coronary artery without angina pectoris: Secondary | ICD-10-CM

## 2016-10-23 DIAGNOSIS — J440 Chronic obstructive pulmonary disease with acute lower respiratory infection: Secondary | ICD-10-CM | POA: Diagnosis not present

## 2016-10-23 DIAGNOSIS — I5032 Chronic diastolic (congestive) heart failure: Secondary | ICD-10-CM | POA: Diagnosis not present

## 2016-10-23 DIAGNOSIS — N183 Chronic kidney disease, stage 3 (moderate): Secondary | ICD-10-CM | POA: Diagnosis not present

## 2016-10-23 MED ORDER — LEVOFLOXACIN 750 MG PO TABS: 750.0000 mg | ORAL_TABLET | Freq: Every day | ORAL | 0 refills | Status: DC

## 2016-10-23 NOTE — Progress Notes (Signed)
Carlos Harrington is a 83 y.o. male who presents to [place]: [place] today for Shortness of breath and congestion. Patient was recently hospitalized for shortness of breath found to be due to combination of pulmonary edema due to atrial fibrillation and To community-acquired lobar pneumonia. He had significant improvement in symptoms with Levaquin. He continues taking Levaquin and notes his cough and shortness of breath has significantly improved. He has follow-up appointments with both cardiology and pulmonology in the near future.    Past Medical History:  Diagnosis Date  . Anxiety   . CAD (coronary artery disease)    with stenting X 4 in [place]  . Depression   . Hyperlipidemia   . Hypertension   . Macular degeneration   . Mild cognitive impairment 01/07/2016   Past Surgical History:  Procedure Laterality Date  . APPENDECTOMY    . CHOLECYSTECTOMY    . TONSILLECTOMY     Social History  Substance Use Topics  . Smoking status: Never Smoker  . Smokeless tobacco: Never Used  . Alcohol use Yes     Comment: 2 daily   family history includes Depression in his mother; Diabetes in his mother; Hyperlipidemia in his mother; Hypertension in his mother; Other in his brother, mother, and sister.  ROS as above:  Medications: Current Outpatient Prescriptions  Medication Sig Dispense Refill  . albuterol (PROVENTIL HFA;VENTOLIN HFA) 108 (90 Base) MCG/ACT inhaler Inhale 1-2 puffs into the lungs every 6 (six) hours as needed for wheezing or shortness of breath. 1 Inhaler 1  . albuterol (PROVENTIL) (2.5 MG/3ML) 0.083% nebulizer solution Take 3 mLs (2.5 mg total) by nebulization every 6 (six) hours as needed for wheezing or shortness of breath. 150 mL 1  . AMBULATORY NON FORMULARY MEDICATION Stair Lift at home for use as needed. 1 each 0  . AMBULATORY NON FORMULARY MEDICATION Continuous positive  airway pressure (CPAP) machine auto-titrate from 4-20 cm of H2O pressure, with all supplemental supplies as needed. 1 each 0  . apixaban (ELIQUIS) 5 MG TABS tablet Take by mouth.    [name] atorvastatin (LIPITOR) 40 MG tablet Take 40 mg by mouth daily.  11  . cetirizine (ZYRTEC) 10 MG tablet Take 1 tablet (10 mg total) by mouth daily. 90 tablet 1  . clobetasol cream (TEMOVATE) 3.71 % Apply 1 application topically 2 (two) times daily. 30 g 0  . doxazosin (CARDURA) 4 MG tablet TAKE ONE TABLET BY MOUTH AT BEDTIME 28 tablet 1  . furosemide (LASIX) 20 MG tablet TAKE ONE TABLET BY MOUTH 2 TIMES A DAY. (AM & NOON) 56 tablet 1  . ipratropium-albuterol (DUONEB) 0.5-2.5 (3) MG/3ML SOLN Take 3 mLs by nebulization every 6 (six) hours as needed. 360 mL 1  . levofloxacin (LEVAQUIN) 750 MG tablet Take 750 mg by mouth daily.  0  . QC LO-DOSE ASPIRIN 81 MG EC tablet TAKE ONE TABLET BY MOUTH EVERY DAY 30 tablet 1  . sertraline (ZOLOFT) 100 MG tablet TAKE ONE TABLET BY MOUTH EVERY DAY 30 tablet 0  . vitamin B-12 (CYANOCOBALAMIN) 1000 MCG tablet Take one tablet by mouth daily.  28 tablet 5   No current facility-administered medications for this visit.    Allergies  Allergen Reactions  . Amlodipine Itching  . Carvedilol Itching    Health Maintenance Health Maintenance  Topic Date Due  . TETANUS/TDAP  07/26/2024  . INFLUENZA VACCINE  Completed  . PNA vac Low Risk Adult  Completed     Exam:  BP 113/72   Pulse (!) 103   Temp (!) 97.5 F (36.4 C) (Oral)   Wt 207 lb (93.9 kg)   SpO2 95%   BMI 32.42 kg/m   Wt Readings from Last 5 Encounters:  10/23/16 207 lb (93.9 kg)  10/10/16 208 lb (94.3 kg)  10/03/16 206 lb (93.4 kg)  09/11/16 220 lb (99.8 kg)  08/03/16 207 lb 12.8 oz (94.3 kg)    Gen: Well NAD HEENT: EOMI,  MMM Lungs: Normal work of breathing. Coarse breath sounds Heart: RRR no MRG Abd: NABS, Soft. Nondistended, Nontender Exts: Brisk capillary refill, warm and well perfused.    No results  found for this or any previous visit (from the past 72 hour(s)). No results found.    Assessment and Plan: 83 y.o. male with Community-acquired pneumonia and pulmonary edema both improved. Patient is doing much better today than he has previously. Plan to continue the Levaquin for total of 10 days post hospitalization treatment. Continue other Chronic medications. Recheck in 2 weeks. We'll check on pending labs from his recent hospitalization then as well. Return sooner if needed.   No orders of the defined types were placed in this encounter.  Meds ordered this encounter  Medications  . levofloxacin (LEVAQUIN) 750 MG tablet    Sig: Take 750 mg by mouth daily.    Refill:  0  . apixaban (ELIQUIS) 5 MG TABS tablet    Sig: Take by mouth.     Discussed warning signs or symptoms. Please see discharge instructions. Patient expresses understanding.  I spent 25 minutes with this patient, greater than 50% was face-to-face time counseling regarding ddx and treatment.

## 2016-10-23 NOTE — Patient Instructions (Addendum)
Thank you for coming in today. Complete course of Levaquin for a total of 10 days after hospitilization.  Continue current medicines.  Recheck with me as scheduled in about 2 weeks.   Do not drive until you are stronger.   Recheck sooner if needed.

## 2016-10-25 DIAGNOSIS — Z955 Presence of coronary angioplasty implant and graft: Secondary | ICD-10-CM | POA: Diagnosis not present

## 2016-10-25 DIAGNOSIS — I480 Paroxysmal atrial fibrillation: Secondary | ICD-10-CM | POA: Diagnosis not present

## 2016-10-25 DIAGNOSIS — I10 Essential (primary) hypertension: Secondary | ICD-10-CM | POA: Diagnosis not present

## 2016-10-25 DIAGNOSIS — I252 Old myocardial infarction: Secondary | ICD-10-CM | POA: Diagnosis not present

## 2016-10-25 DIAGNOSIS — I5032 Chronic diastolic (congestive) heart failure: Secondary | ICD-10-CM | POA: Diagnosis not present

## 2016-10-25 DIAGNOSIS — I2510 Atherosclerotic heart disease of native coronary artery without angina pectoris: Secondary | ICD-10-CM | POA: Diagnosis not present

## 2016-10-25 DIAGNOSIS — N183 Chronic kidney disease, stage 3 (moderate): Secondary | ICD-10-CM | POA: Diagnosis not present

## 2016-10-25 DIAGNOSIS — Z8673 Personal history of transient ischemic attack (TIA), and cerebral infarction without residual deficits: Secondary | ICD-10-CM | POA: Diagnosis not present

## 2016-10-26 ENCOUNTER — Other Ambulatory Visit: Payer: Self-pay | Admitting: Family Medicine

## 2016-10-26 DIAGNOSIS — F3341 Major depressive disorder, recurrent, in partial remission: Secondary | ICD-10-CM

## 2016-10-27 DIAGNOSIS — I5032 Chronic diastolic (congestive) heart failure: Secondary | ICD-10-CM | POA: Diagnosis not present

## 2016-10-27 DIAGNOSIS — N183 Chronic kidney disease, stage 3 (moderate): Secondary | ICD-10-CM | POA: Diagnosis not present

## 2016-10-27 DIAGNOSIS — J440 Chronic obstructive pulmonary disease with acute lower respiratory infection: Secondary | ICD-10-CM | POA: Diagnosis not present

## 2016-10-27 DIAGNOSIS — J189 Pneumonia, unspecified organism: Secondary | ICD-10-CM | POA: Diagnosis not present

## 2016-10-27 DIAGNOSIS — I2510 Atherosclerotic heart disease of native coronary artery without angina pectoris: Secondary | ICD-10-CM | POA: Diagnosis not present

## 2016-10-27 DIAGNOSIS — I130 Hypertensive heart and chronic kidney disease with heart failure and stage 1 through stage 4 chronic kidney disease, or unspecified chronic kidney disease: Secondary | ICD-10-CM | POA: Diagnosis not present

## 2016-10-28 DIAGNOSIS — J440 Chronic obstructive pulmonary disease with acute lower respiratory infection: Secondary | ICD-10-CM | POA: Diagnosis not present

## 2016-10-28 DIAGNOSIS — N183 Chronic kidney disease, stage 3 (moderate): Secondary | ICD-10-CM | POA: Diagnosis not present

## 2016-10-28 DIAGNOSIS — I130 Hypertensive heart and chronic kidney disease with heart failure and stage 1 through stage 4 chronic kidney disease, or unspecified chronic kidney disease: Secondary | ICD-10-CM | POA: Diagnosis not present

## 2016-10-28 DIAGNOSIS — I5032 Chronic diastolic (congestive) heart failure: Secondary | ICD-10-CM | POA: Diagnosis not present

## 2016-10-28 DIAGNOSIS — I2510 Atherosclerotic heart disease of native coronary artery without angina pectoris: Secondary | ICD-10-CM | POA: Diagnosis not present

## 2016-10-28 DIAGNOSIS — J189 Pneumonia, unspecified organism: Secondary | ICD-10-CM | POA: Diagnosis not present

## 2016-10-30 ENCOUNTER — Other Ambulatory Visit: Payer: Self-pay | Admitting: Family Medicine

## 2016-10-30 DIAGNOSIS — I10 Essential (primary) hypertension: Secondary | ICD-10-CM | POA: Diagnosis not present

## 2016-10-30 DIAGNOSIS — I5032 Chronic diastolic (congestive) heart failure: Secondary | ICD-10-CM | POA: Diagnosis not present

## 2016-10-30 DIAGNOSIS — N183 Chronic kidney disease, stage 3 (moderate): Secondary | ICD-10-CM | POA: Diagnosis not present

## 2016-10-30 DIAGNOSIS — I2510 Atherosclerotic heart disease of native coronary artery without angina pectoris: Secondary | ICD-10-CM | POA: Diagnosis not present

## 2016-10-30 DIAGNOSIS — I130 Hypertensive heart and chronic kidney disease with heart failure and stage 1 through stage 4 chronic kidney disease, or unspecified chronic kidney disease: Secondary | ICD-10-CM | POA: Diagnosis not present

## 2016-10-30 DIAGNOSIS — G4733 Obstructive sleep apnea (adult) (pediatric): Secondary | ICD-10-CM | POA: Diagnosis not present

## 2016-10-30 DIAGNOSIS — J189 Pneumonia, unspecified organism: Secondary | ICD-10-CM | POA: Diagnosis not present

## 2016-10-30 DIAGNOSIS — R05 Cough: Secondary | ICD-10-CM | POA: Diagnosis not present

## 2016-10-30 DIAGNOSIS — J440 Chronic obstructive pulmonary disease with acute lower respiratory infection: Secondary | ICD-10-CM | POA: Diagnosis not present

## 2016-10-30 DIAGNOSIS — Z9989 Dependence on other enabling machines and devices: Secondary | ICD-10-CM | POA: Diagnosis not present

## 2016-10-30 DIAGNOSIS — I252 Old myocardial infarction: Secondary | ICD-10-CM | POA: Diagnosis not present

## 2016-10-31 DIAGNOSIS — J440 Chronic obstructive pulmonary disease with acute lower respiratory infection: Secondary | ICD-10-CM | POA: Diagnosis not present

## 2016-10-31 DIAGNOSIS — I2510 Atherosclerotic heart disease of native coronary artery without angina pectoris: Secondary | ICD-10-CM | POA: Diagnosis not present

## 2016-10-31 DIAGNOSIS — I5032 Chronic diastolic (congestive) heart failure: Secondary | ICD-10-CM | POA: Diagnosis not present

## 2016-10-31 DIAGNOSIS — N183 Chronic kidney disease, stage 3 (moderate): Secondary | ICD-10-CM | POA: Diagnosis not present

## 2016-10-31 DIAGNOSIS — I130 Hypertensive heart and chronic kidney disease with heart failure and stage 1 through stage 4 chronic kidney disease, or unspecified chronic kidney disease: Secondary | ICD-10-CM | POA: Diagnosis not present

## 2016-10-31 DIAGNOSIS — J189 Pneumonia, unspecified organism: Secondary | ICD-10-CM | POA: Diagnosis not present

## 2016-11-01 ENCOUNTER — Telehealth: Payer: Self-pay

## 2016-11-01 DIAGNOSIS — J189 Pneumonia, unspecified organism: Secondary | ICD-10-CM | POA: Diagnosis not present

## 2016-11-01 DIAGNOSIS — I2510 Atherosclerotic heart disease of native coronary artery without angina pectoris: Secondary | ICD-10-CM | POA: Diagnosis not present

## 2016-11-01 DIAGNOSIS — I5032 Chronic diastolic (congestive) heart failure: Secondary | ICD-10-CM | POA: Diagnosis not present

## 2016-11-01 DIAGNOSIS — J440 Chronic obstructive pulmonary disease with acute lower respiratory infection: Secondary | ICD-10-CM | POA: Diagnosis not present

## 2016-11-01 DIAGNOSIS — I130 Hypertensive heart and chronic kidney disease with heart failure and stage 1 through stage 4 chronic kidney disease, or unspecified chronic kidney disease: Secondary | ICD-10-CM | POA: Diagnosis not present

## 2016-11-01 DIAGNOSIS — N183 Chronic kidney disease, stage 3 (moderate): Secondary | ICD-10-CM | POA: Diagnosis not present

## 2016-11-01 NOTE — Telephone Encounter (Signed)
Pharmacy called requesting a refill of eliquis. Please refill if appropriate.

## 2016-11-02 DIAGNOSIS — I5032 Chronic diastolic (congestive) heart failure: Secondary | ICD-10-CM | POA: Diagnosis not present

## 2016-11-02 DIAGNOSIS — J189 Pneumonia, unspecified organism: Secondary | ICD-10-CM | POA: Diagnosis not present

## 2016-11-02 DIAGNOSIS — N183 Chronic kidney disease, stage 3 (moderate): Secondary | ICD-10-CM | POA: Diagnosis not present

## 2016-11-02 DIAGNOSIS — I130 Hypertensive heart and chronic kidney disease with heart failure and stage 1 through stage 4 chronic kidney disease, or unspecified chronic kidney disease: Secondary | ICD-10-CM | POA: Diagnosis not present

## 2016-11-02 DIAGNOSIS — I2510 Atherosclerotic heart disease of native coronary artery without angina pectoris: Secondary | ICD-10-CM | POA: Diagnosis not present

## 2016-11-02 DIAGNOSIS — J440 Chronic obstructive pulmonary disease with acute lower respiratory infection: Secondary | ICD-10-CM | POA: Diagnosis not present

## 2016-11-02 MED ORDER — APIXABAN 5 MG PO TABS
5.0000 mg | ORAL_TABLET | Freq: Two times a day (BID) | ORAL | 6 refills | Status: DC
Start: 2016-11-02 — End: 2017-03-12

## 2016-11-02 NOTE — Telephone Encounter (Signed)
Medication sent.

## 2016-11-07 ENCOUNTER — Ambulatory Visit: Payer: Self-pay | Admitting: Family Medicine

## 2016-11-19 ENCOUNTER — Other Ambulatory Visit: Payer: Self-pay | Admitting: Family Medicine

## 2016-11-19 DIAGNOSIS — I10 Essential (primary) hypertension: Secondary | ICD-10-CM

## 2016-11-19 DIAGNOSIS — I2510 Atherosclerotic heart disease of native coronary artery without angina pectoris: Secondary | ICD-10-CM

## 2016-11-21 ENCOUNTER — Other Ambulatory Visit: Payer: Self-pay | Admitting: Family Medicine

## 2016-11-29 ENCOUNTER — Encounter: Payer: Self-pay | Admitting: Family Medicine

## 2016-11-29 ENCOUNTER — Ambulatory Visit (INDEPENDENT_AMBULATORY_CARE_PROVIDER_SITE_OTHER): Payer: Medicare Other | Admitting: Family Medicine

## 2016-11-29 VITALS — BP 138/73 | HR 85 | Temp 98.1°F | Wt 203.0 lb

## 2016-11-29 DIAGNOSIS — R059 Cough, unspecified: Secondary | ICD-10-CM

## 2016-11-29 DIAGNOSIS — I2510 Atherosclerotic heart disease of native coronary artery without angina pectoris: Secondary | ICD-10-CM | POA: Diagnosis not present

## 2016-11-29 DIAGNOSIS — E507 Other ocular manifestations of vitamin A deficiency: Secondary | ICD-10-CM | POA: Diagnosis not present

## 2016-11-29 DIAGNOSIS — R05 Cough: Secondary | ICD-10-CM

## 2016-11-29 MED ORDER — GUAIFENESIN-CODEINE 100-10 MG/5ML PO SOLN: 5.0000 mL | Freq: Every evening | ORAL | 0 refills | Status: AC | PRN

## 2016-11-29 MED ORDER — TRIAMCINOLONE ACETONIDE 0.1 % EX CREA: 1.0000 "application " | TOPICAL_CREAM | Freq: Two times a day (BID) | CUTANEOUS | 12 refills | Status: AC

## 2016-11-29 MED ORDER — LEVOFLOXACIN 500 MG PO TABS
500.0000 mg | ORAL_TABLET | Freq: Every day | ORAL | 0 refills | Status: DC
Start: 2016-11-29 — End: 2017-03-12

## 2016-11-29 NOTE — Progress Notes (Signed)
Carlos Harrington is a 83 y.o. male who presents to [place]: [place] today for cough and itching.   Cough: Mcdaniel has a one-week history of cough and wheezing.  He recently recovered from a community-acquired pneumonia that caused extensive coughing.  He was seen by pulmonology and has a follow-up CT scan scheduled for 5 days from today.  He denies any fever or severe shortness of breath or leg swelling.  The cough is quite obnoxious and interferes with sleep.  Itching: Uvaldo notes a several day history of significant itching across his extremities.  He denies any visible rash new medications or new soaps detergents or shampoos.  He had a history of drug reaction in the past causing itching and rash.  He denies any current rash with this itching.  He has been using over-the-counter S[place] cream which helps helps temporarily.     Past Medical History:  Diagnosis Date  . Anxiety   . CAD (coronary artery disease)    with stenting X 4 in [place]  . Depression   . Hyperlipidemia   . Hypertension   . Macular degeneration   . Mild cognitive impairment 01/07/2016   Past Surgical History:  Procedure Laterality Date  . APPENDECTOMY    . CHOLECYSTECTOMY    . TONSILLECTOMY     Social History   Tobacco Use  . Smoking status: Never Smoker  . Smokeless tobacco: Never Used  Substance Use Topics  . Alcohol use: Yes    Comment: 2 daily   family history includes Depression in his mother; Diabetes in his mother; Hyperlipidemia in his mother; Hypertension in his mother; Other in his brother, mother, and sister.  ROS as above:  Medications: Current Outpatient Medications  Medication Sig Dispense Refill  . albuterol (PROVENTIL HFA;VENTOLIN HFA) 108 (90 Base) MCG/ACT inhaler Inhale 1-2 puffs into the lungs every 6 (six) hours as needed for wheezing or shortness of breath. 1 Inhaler 1  . albuterol  (PROVENTIL) (2.5 MG/3ML) 0.083% nebulizer solution Take 3 mLs (2.5 mg total) by nebulization every 6 (six) hours as needed for wheezing or shortness of breath. 150 mL 1  . AMBULATORY NON FORMULARY MEDICATION Stair Lift at home for use as needed. 1 each 0  . AMBULATORY NON FORMULARY MEDICATION Continuous positive airway pressure (CPAP) machine auto-titrate from 4-20 cm of H2O pressure, with all supplemental supplies as needed. 1 each 0  . apixaban (ELIQUIS) 5 MG TABS tablet Take 1 tablet (5 mg total) by mouth 2 (two) times daily. 60 tablet 6  . atorvastatin (LIPITOR) 40 MG tablet Take 40 mg by mouth daily.  11  . cetirizine (ZYRTEC) 10 MG tablet Take 1 tablet (10 mg total) by mouth daily. 90 tablet 1  . clobetasol cream (TEMOVATE) 7.02 % Apply 1 application topically 2 (two) times daily. 30 g 0  . doxazosin (CARDURA) 4 MG tablet TAKE ONE TABLET BY MOUTH AT BEDTIME 28 tablet 1  . furosemide (LASIX) 40 MG tablet Take one tablet (40 mg total) by mouth daily. 30 tablet 1  . QC LO-DOSE ASPIRIN 81 MG EC tablet TAKE ONE TABLET BY MOUTH EVERY DAY 30 tablet 1  . sertraline (ZOLOFT) 100 MG tablet TAKE ONE TABLET BY MOUTH EVERY DAY 30 tablet 0  . vitamin B-12 (CYANOCOBALAMIN) 1000 MCG tablet Take one tablet by mouth daily.  28 tablet 5  . guaiFENesin-codeine 100-10 MG/5ML syrup Take 5 mLs by mouth at bedtime as needed  for cough. 180 mL 0  . levofloxacin (LEVAQUIN) 500 MG tablet Take 1 tablet (500 mg total) by mouth daily. 7 tablet 0  . triamcinolone cream (KENALOG) 0.1 % Apply 1 application topically 2 (two) times daily. 453.6 g 12   No current facility-administered medications for this visit.    Allergies  Allergen Reactions  . Amlodipine Itching  . Carvedilol Itching    Health Maintenance Health Maintenance  Topic Date Due  . TETANUS/TDAP  07/26/2024  . INFLUENZA VACCINE  Completed  . PNA vac Low Risk Adult  Completed     Exam:  BP 138/73   Pulse 85   Temp 98.1 F (36.7 C) (Oral)   Wt 203  lb (92.1 kg)   SpO2 93%   BMI 31.79 kg/m  Gen: Well NAD HEENT: EOMI,  MMM Lungs: Normal work of breathing.  Coarse breath sounds Heart: RRR no MRG Abd: NABS, Soft. Nondistended, Nontender Exts: Brisk capillary refill, warm and well perfused.  Skin: Multiple areas of excoriation no erythema or induration.   No results found for this or any previous visit (from the past 72 hour(s)). No results found.    Assessment and Plan: 83 y.o. male with  Cough: Likely viral potential new recurrent pneumonia.  Patient has had multiple recurrent community-acquired pneumonias and has a follow-up CT scan scheduled.  We had an extensive discussion and plan to hold off on chest x-ray today.  We will go ahead and treat symptomatically with codeine cough syrup as this is helped previously and use Levaquin empirically.  Rash: Likely xeroma.  Will use triamcinolone cream and moisturizers.  Recheck if not better.  Reasonable to follow-up with dermatology if not improved as well.   No orders of the defined types were placed in this encounter.  Meds ordered this encounter  Medications  . guaiFENesin-codeine 100-10 MG/5ML syrup    Sig: Take 5 mLs by mouth at bedtime as needed for cough.    Dispense:  180 mL    Refill:  0  . levofloxacin (LEVAQUIN) 500 MG tablet    Sig: Take 1 tablet (500 mg total) by mouth daily.    Dispense:  7 tablet    Refill:  0  . triamcinolone cream (KENALOG) 0.1 %    Sig: Apply 1 application topically 2 (two) times daily.    Dispense:  453.6 g    Refill:  12     Discussed warning signs or symptoms. Please see discharge instructions. Patient expresses understanding.  I spent 25 minutes with this patient, greater than 50% was face-to-face time counseling regarding ddx and treatment plan. [name]

## 2016-11-29 NOTE — Patient Instructions (Addendum)
Thank you for coming in today. Add triamcinolone cream daily.  Take Levaquin for possible pneumonia.  Use codeine cough medicine as needed.  Get CT scan of the lungs as scheduled for Monday.  I recommend getting a follow up appointment with Dermatology in a few weeks so you have it scheduled if itching does not get better.  Recheck if not better.

## 2016-11-30 ENCOUNTER — Other Ambulatory Visit: Payer: Self-pay | Admitting: Family Medicine

## 2016-11-30 DIAGNOSIS — F3341 Major depressive disorder, recurrent, in partial remission: Secondary | ICD-10-CM

## 2016-12-04 DIAGNOSIS — R05 Cough: Secondary | ICD-10-CM | POA: Diagnosis not present

## 2016-12-04 DIAGNOSIS — J9811 Atelectasis: Secondary | ICD-10-CM | POA: Diagnosis not present

## 2016-12-04 DIAGNOSIS — J189 Pneumonia, unspecified organism: Secondary | ICD-10-CM | POA: Diagnosis not present

## 2016-12-04 DIAGNOSIS — I700 Atherosclerosis of aorta: Secondary | ICD-10-CM | POA: Diagnosis not present

## 2016-12-04 DIAGNOSIS — J9809 Other diseases of bronchus, not elsewhere classified: Secondary | ICD-10-CM | POA: Diagnosis not present

## 2016-12-12 DIAGNOSIS — I252 Old myocardial infarction: Secondary | ICD-10-CM | POA: Diagnosis not present

## 2016-12-12 DIAGNOSIS — G4733 Obstructive sleep apnea (adult) (pediatric): Secondary | ICD-10-CM | POA: Diagnosis not present

## 2016-12-12 DIAGNOSIS — Z9989 Dependence on other enabling machines and devices: Secondary | ICD-10-CM | POA: Diagnosis not present

## 2016-12-12 DIAGNOSIS — R05 Cough: Secondary | ICD-10-CM | POA: Diagnosis not present

## 2016-12-12 DIAGNOSIS — I10 Essential (primary) hypertension: Secondary | ICD-10-CM | POA: Diagnosis not present

## 2016-12-12 DIAGNOSIS — J181 Lobar pneumonia, unspecified organism: Secondary | ICD-10-CM | POA: Diagnosis not present

## 2016-12-12 DIAGNOSIS — I2510 Atherosclerotic heart disease of native coronary artery without angina pectoris: Secondary | ICD-10-CM | POA: Diagnosis not present

## 2016-12-19 DIAGNOSIS — Z888 Allergy status to other drugs, medicaments and biological substances status: Secondary | ICD-10-CM | POA: Diagnosis not present

## 2016-12-19 DIAGNOSIS — N183 Chronic kidney disease, stage 3 (moderate): Secondary | ICD-10-CM | POA: Diagnosis not present

## 2016-12-19 DIAGNOSIS — Z7901 Long term (current) use of anticoagulants: Secondary | ICD-10-CM | POA: Diagnosis not present

## 2016-12-19 DIAGNOSIS — I5032 Chronic diastolic (congestive) heart failure: Secondary | ICD-10-CM | POA: Diagnosis not present

## 2016-12-19 DIAGNOSIS — E7800 Pure hypercholesterolemia, unspecified: Secondary | ICD-10-CM | POA: Diagnosis not present

## 2016-12-19 DIAGNOSIS — Z885 Allergy status to narcotic agent status: Secondary | ICD-10-CM | POA: Diagnosis not present

## 2016-12-19 DIAGNOSIS — J9811 Atelectasis: Secondary | ICD-10-CM | POA: Diagnosis not present

## 2016-12-19 DIAGNOSIS — Z9989 Dependence on other enabling machines and devices: Secondary | ICD-10-CM | POA: Diagnosis not present

## 2016-12-19 DIAGNOSIS — I252 Old myocardial infarction: Secondary | ICD-10-CM | POA: Diagnosis not present

## 2016-12-19 DIAGNOSIS — Z7982 Long term (current) use of aspirin: Secondary | ICD-10-CM | POA: Diagnosis not present

## 2016-12-19 DIAGNOSIS — I130 Hypertensive heart and chronic kidney disease with heart failure and stage 1 through stage 4 chronic kidney disease, or unspecified chronic kidney disease: Secondary | ICD-10-CM | POA: Diagnosis not present

## 2016-12-19 DIAGNOSIS — Z791 Long term (current) use of non-steroidal anti-inflammatories (NSAID): Secondary | ICD-10-CM | POA: Diagnosis not present

## 2016-12-19 DIAGNOSIS — M1990 Unspecified osteoarthritis, unspecified site: Secondary | ICD-10-CM | POA: Diagnosis not present

## 2016-12-19 DIAGNOSIS — J9809 Other diseases of bronchus, not elsewhere classified: Secondary | ICD-10-CM | POA: Diagnosis not present

## 2016-12-19 DIAGNOSIS — J449 Chronic obstructive pulmonary disease, unspecified: Secondary | ICD-10-CM | POA: Diagnosis not present

## 2016-12-19 DIAGNOSIS — I480 Paroxysmal atrial fibrillation: Secondary | ICD-10-CM | POA: Diagnosis not present

## 2016-12-19 DIAGNOSIS — G4733 Obstructive sleep apnea (adult) (pediatric): Secondary | ICD-10-CM | POA: Diagnosis not present

## 2016-12-19 DIAGNOSIS — E785 Hyperlipidemia, unspecified: Secondary | ICD-10-CM | POA: Diagnosis not present

## 2016-12-19 DIAGNOSIS — I69398 Other sequelae of cerebral infarction: Secondary | ICD-10-CM | POA: Diagnosis not present

## 2016-12-19 DIAGNOSIS — Z9049 Acquired absence of other specified parts of digestive tract: Secondary | ICD-10-CM | POA: Diagnosis not present

## 2016-12-19 DIAGNOSIS — I110 Hypertensive heart disease with heart failure: Secondary | ICD-10-CM | POA: Diagnosis not present

## 2016-12-19 DIAGNOSIS — Z79899 Other long term (current) drug therapy: Secondary | ICD-10-CM | POA: Diagnosis not present

## 2016-12-19 DIAGNOSIS — I25119 Atherosclerotic heart disease of native coronary artery with unspecified angina pectoris: Secondary | ICD-10-CM | POA: Diagnosis not present

## 2016-12-19 DIAGNOSIS — Z955 Presence of coronary angioplasty implant and graft: Secondary | ICD-10-CM | POA: Diagnosis not present

## 2016-12-19 DIAGNOSIS — J189 Pneumonia, unspecified organism: Secondary | ICD-10-CM | POA: Diagnosis not present

## 2016-12-20 ENCOUNTER — Telehealth: Payer: Self-pay

## 2016-12-20 ENCOUNTER — Encounter: Payer: Self-pay | Admitting: Family Medicine

## 2016-12-20 ENCOUNTER — Ambulatory Visit (INDEPENDENT_AMBULATORY_CARE_PROVIDER_SITE_OTHER): Payer: Medicare Other | Admitting: Family Medicine

## 2016-12-20 VITALS — BP 137/69 | HR 68 | Wt 199.0 lb

## 2016-12-20 DIAGNOSIS — N183 Chronic kidney disease, stage 3 unspecified: Secondary | ICD-10-CM

## 2016-12-20 DIAGNOSIS — R739 Hyperglycemia, unspecified: Secondary | ICD-10-CM | POA: Diagnosis not present

## 2016-12-20 DIAGNOSIS — R911 Solitary pulmonary nodule: Secondary | ICD-10-CM | POA: Diagnosis not present

## 2016-12-20 DIAGNOSIS — R059 Cough, unspecified: Secondary | ICD-10-CM

## 2016-12-20 DIAGNOSIS — R05 Cough: Secondary | ICD-10-CM

## 2016-12-20 DIAGNOSIS — N400 Enlarged prostate without lower urinary tract symptoms: Secondary | ICD-10-CM

## 2016-12-20 DIAGNOSIS — R972 Elevated prostate specific antigen [PSA]: Secondary | ICD-10-CM

## 2016-12-20 DIAGNOSIS — R238 Other skin changes: Secondary | ICD-10-CM

## 2016-12-20 DIAGNOSIS — D696 Thrombocytopenia, unspecified: Secondary | ICD-10-CM | POA: Diagnosis not present

## 2016-12-20 DIAGNOSIS — I10 Essential (primary) hypertension: Secondary | ICD-10-CM

## 2016-12-20 NOTE — Progress Notes (Signed)
Carlos Harrington is a 83 y.o. male who presents to [facility] [place] today for foot blisters.  Carlos Harrington has itchy blisters on his feet present for a few days.  He cannot think of any new exposures or medication changes.  He is tried some foot powder which helps a bit.  He denies any fevers or chills.  He notes that he has a follow-up appointment scheduled with dermatology tomorrow.  Carlos Harrington also has had an extensive workup with pulmonology recently for persistent cough.  Repeat CT scan showed continued right lower lobe consolidation.  He had a bronchoscopy yesterday that was concerning for extrabronchial mass.  Pathology is pending at this time.  He notes continued coughing but denies any fevers or significant shortness of breath today.   Past Medical History:  Diagnosis Date  . Anxiety   . CAD (coronary artery disease)    with stenting X 4 in [place]  . Depression   . Hyperlipidemia   . Hypertension   . Macular degeneration   . Mild cognitive impairment 01/07/2016   Past Surgical History:  Procedure Laterality Date  . APPENDECTOMY    . CHOLECYSTECTOMY    . TONSILLECTOMY     Social History   Tobacco Use  . Smoking status: Never Smoker  . Smokeless tobacco: Never Used  Substance Use Topics  . Alcohol use: Yes    Comment: 2 daily   family history includes Depression in his mother; Diabetes in his mother; Hyperlipidemia in his mother; Hypertension in his mother; Other in his brother, mother, and sister.  ROS as above:  Medications: Current Outpatient Medications  Medication Sig Dispense Refill  . albuterol (PROVENTIL HFA;VENTOLIN HFA) 108 (90 Base) MCG/ACT inhaler Inhale 1-2 puffs into the lungs every 6 (six) hours as needed for wheezing or shortness of breath. 1 Inhaler 1  . albuterol (PROVENTIL) (2.5 MG/3ML) 0.083% nebulizer solution Take 3 mLs (2.5 mg total) by nebulization every 6  (six) hours as needed for wheezing or shortness of breath. 150 mL 1  . AMBULATORY NON FORMULARY MEDICATION Stair Lift at home for use as needed. 1 each 0  . AMBULATORY NON FORMULARY MEDICATION Continuous positive airway pressure (CPAP) machine auto-titrate from 4-20 cm of H2O pressure, with all supplemental supplies as needed. 1 each 0  . apixaban (ELIQUIS) 5 MG TABS tablet Take 1 tablet (5 mg total) by mouth 2 (two) times daily. 60 tablet 6  . atorvastatin (LIPITOR) 40 MG tablet Take 40 mg by mouth daily.  11  . cetirizine (ZYRTEC) 10 MG tablet Take 1 tablet (10 mg total) by mouth daily. 90 tablet 1  . clobetasol cream (TEMOVATE) 5.39 % Apply 1 application topically 2 (two) times daily. 30 g 0  . doxazosin (CARDURA) 4 MG tablet TAKE ONE TABLET BY MOUTH AT BEDTIME 28 tablet 1  . furosemide (LASIX) 40 MG tablet Take one tablet (40 mg total) by mouth daily. 30 tablet 1  . guaiFENesin-codeine 100-10 MG/5ML syrup Take 5 mLs by mouth at bedtime as needed for cough. 180 mL 0  . levofloxacin (LEVAQUIN) 500 MG tablet Take 1 tablet (500 mg total) by mouth daily. 7 tablet 0  . QC LO-DOSE ASPIRIN 81 MG EC tablet TAKE ONE TABLET BY MOUTH EVERY DAY 30 tablet 1  . sertraline (ZOLOFT) 100 MG tablet TAKE ONE TABLET BY MOUTH EVERY DAY 30 tablet 0  . triamcinolone cream (KENALOG) 0.1 % Apply 1 application topically 2 (two)  times daily. 453.6 g 12  . vitamin B-12 (CYANOCOBALAMIN) 1000 MCG tablet Take one tablet by mouth daily.  28 tablet 5   No current facility-administered medications for this visit.    Allergies  Allergen Reactions  . Amlodipine Itching  . Carvedilol Itching    Health Maintenance Health Maintenance  Topic Date Due  . TETANUS/TDAP  07/26/2024  . INFLUENZA VACCINE  Completed  . PNA vac Low Risk Adult  Completed     Exam:  BP 137/69   Pulse 68   Wt 199 lb (90.3 kg)   BMI 31.17 kg/m  Gen: Well NAD HEENT: EOMI,  MMM Lungs: Normal work of breathing. CTABL Heart: RRR no MRG Abd:  NABS, Soft. Nondistended, Nontender Exts: Brisk capillary refill, warm and well perfused.  Skin: Several small erythematous papules and several larger bullae present on feet and heels bilaterally.  Some bullae contain clear fluid and some more clouded with blood.  The skin is not friable does not slip and cause bullae with light friction.        No results found for this or any previous visit (from the past 72 hour(s)). No results found.    Assessment and Plan: 83 y.o. male with blisters and rash feet bilaterally.  Etiology is unclear.  Carlos Harrington has a history of drug reaction to Coreg and amlodipine in the past.  Fortunately he has a follow-up appointment with dermatology scheduled tomorrow.  Recommend triamcinolone cream and follow-up with dermatology.  Will complete basic metabolic workup listed below.  We will also check A1c and PSA for follow-up these labs which have been followed in the past.  As for his bronchoscopy results.  Pathology is still pending.  We will continue to follow along and have discussions about the ultimate causes when the diagnosis is more clear.   Orders Placed This Encounter  Procedures  . CBC with Differential/Platelet  . COMPLETE METABOLIC PANEL WITH GFR  . Hemoglobin A1c  . PSA   No orders of the defined types were placed in this encounter.    Discussed warning signs or symptoms. Please see discharge instructions. Patient expresses understanding.

## 2016-12-20 NOTE — Telephone Encounter (Signed)
Patient daughter called and stated that her father did not understand his visit today. Please give her a call@ [phone].  Bernadine Melecio,CMA

## 2016-12-20 NOTE — Patient Instructions (Signed)
Thank you for coming in today. Follow up with Dermatology.  Recheck in 1 month.  Apply the Triamcinolone cream Have the dermatologist send me notes. I will keep an eye on pulmonology notes.  Call or go to the emergency room if you get worse, have trouble breathing, have chest pains, or palpitations.

## 2016-12-21 ENCOUNTER — Inpatient Hospital Stay: Payer: Self-pay | Admitting: Family Medicine

## 2016-12-21 DIAGNOSIS — L299 Pruritus, unspecified: Secondary | ICD-10-CM | POA: Diagnosis not present

## 2016-12-21 DIAGNOSIS — L270 Generalized skin eruption due to drugs and medicaments taken internally: Secondary | ICD-10-CM | POA: Diagnosis not present

## 2016-12-21 DIAGNOSIS — D485 Neoplasm of uncertain behavior of skin: Secondary | ICD-10-CM | POA: Diagnosis not present

## 2016-12-21 LAB — COMPLETE METABOLIC PANEL WITH GFR
AG RATIO: 1 (calc) (ref 1.0–2.5)
ALT: 18 U/L (ref 9–46)
AST: 21 U/L (ref 10–35)
Albumin: 3.3 g/dL — ABNORMAL LOW (ref 3.6–5.1)
Alkaline phosphatase (APISO): 104 U/L (ref 40–115)
BILIRUBIN TOTAL: 0.5 mg/dL (ref 0.2–1.2)
BUN/Creatinine Ratio: 13 (calc) (ref 6–22)
BUN: 16 mg/dL (ref 7–25)
CALCIUM: 8.7 mg/dL (ref 8.6–10.3)
CHLORIDE: 107 mmol/L (ref 98–110)
CO2: 29 mmol/L (ref 20–32)
Creat: 1.22 mg/dL — ABNORMAL HIGH (ref 0.70–1.11)
GFR, EST AFRICAN AMERICAN: 64 mL/min/{1.73_m2} (ref >=60)
GFR, EST NON AFRICAN AMERICAN: 55 mL/min/{1.73_m2} — AB (ref >=60)
GLOBULIN: 3.2 g/dL (ref 1.9–3.7)
Glucose, Bld: 135 mg/dL — ABNORMAL HIGH (ref 65–99)
POTASSIUM: 4.4 mmol/L (ref 3.5–5.3)
SODIUM: 142 mmol/L (ref 135–146)
TOTAL PROTEIN: 6.5 g/dL (ref 6.1–8.1)

## 2016-12-21 LAB — CBC WITH DIFFERENTIAL/PLATELET
BASOS ABS: 19 {cells}/uL (ref 0–200)
Basophils Relative: 0.2 %
EOS ABS: 84 {cells}/uL (ref 15–500)
Eosinophils Relative: 0.9 %
HEMATOCRIT: 34.9 % — AB (ref 38.5–50.0)
Hemoglobin: 11.5 g/dL — ABNORMAL LOW (ref 13.2–17.1)
LYMPHS ABS: 1125 {cells}/uL (ref 850–3900)
MCH: 29 pg (ref 27.0–33.0)
MCHC: 33 g/dL (ref 32.0–36.0)
MCV: 88.1 fL (ref 80.0–100.0)
MPV: 9.1 fL (ref 7.5–12.5)
Monocytes Relative: 8.3 %
NEUTROS PCT: 78.5 %
Neutro Abs: 7301 cells/uL (ref 1500–7800)
PLATELETS: 271 10*3/uL (ref 140–400)
RBC: 3.96 10*6/uL — ABNORMAL LOW (ref 4.20–5.80)
RDW: 12.9 % (ref 11.0–15.0)
TOTAL LYMPHOCYTE: 12.1 %
WBC: 9.3 10*3/uL (ref 3.8–10.8)
WBCMIX: 772 {cells}/uL (ref 200–950)

## 2016-12-21 LAB — HEMOGLOBIN A1C
EAG (MMOL/L): 6.6 (calc)
Hgb A1c MFr Bld: 5.8 % of total Hgb — ABNORMAL HIGH (ref <5.7)
MEAN PLASMA GLUCOSE: 120 (calc)

## 2016-12-21 LAB — PSA: PSA: 7 ng/mL — ABNORMAL HIGH (ref <=4.0)

## 2016-12-21 NOTE — Telephone Encounter (Signed)
Dr. Georgina Snell called and spoke to patient daughter. Rhonda Cunningham,CMA

## 2016-12-22 ENCOUNTER — Other Ambulatory Visit: Payer: Self-pay | Admitting: Family Medicine

## 2016-12-22 ENCOUNTER — Inpatient Hospital Stay: Payer: Self-pay | Admitting: Family Medicine

## 2016-12-28 DIAGNOSIS — G4733 Obstructive sleep apnea (adult) (pediatric): Secondary | ICD-10-CM | POA: Diagnosis not present

## 2016-12-28 DIAGNOSIS — J181 Lobar pneumonia, unspecified organism: Secondary | ICD-10-CM | POA: Diagnosis not present

## 2016-12-28 DIAGNOSIS — I2510 Atherosclerotic heart disease of native coronary artery without angina pectoris: Secondary | ICD-10-CM | POA: Diagnosis not present

## 2016-12-28 DIAGNOSIS — I252 Old myocardial infarction: Secondary | ICD-10-CM | POA: Diagnosis not present

## 2016-12-28 DIAGNOSIS — I10 Essential (primary) hypertension: Secondary | ICD-10-CM | POA: Diagnosis not present

## 2016-12-28 DIAGNOSIS — Z9989 Dependence on other enabling machines and devices: Secondary | ICD-10-CM | POA: Diagnosis not present

## 2016-12-28 DIAGNOSIS — R05 Cough: Secondary | ICD-10-CM | POA: Diagnosis not present

## 2016-12-28 DIAGNOSIS — L270 Generalized skin eruption due to drugs and medicaments taken internally: Secondary | ICD-10-CM | POA: Diagnosis not present

## 2017-01-01 ENCOUNTER — Emergency Department
Admission: EM | Admit: 2017-01-01 | Discharge: 2017-01-01 | Disposition: A | Discharge status: Home / Self Care | Payer: Medicare Other | Source: Home / Self Care

## 2017-01-01 DIAGNOSIS — R21 Rash and other nonspecific skin eruption: Secondary | ICD-10-CM | POA: Diagnosis not present

## 2017-01-05 DIAGNOSIS — S90822A Blister (nonthermal), left foot, initial encounter: Secondary | ICD-10-CM | POA: Diagnosis not present

## 2017-01-05 DIAGNOSIS — L853 Xerosis cutis: Secondary | ICD-10-CM | POA: Diagnosis not present

## 2017-01-05 DIAGNOSIS — I10 Essential (primary) hypertension: Secondary | ICD-10-CM | POA: Diagnosis not present

## 2017-01-05 DIAGNOSIS — S90821A Blister (nonthermal), right foot, initial encounter: Secondary | ICD-10-CM | POA: Diagnosis not present

## 2017-01-05 DIAGNOSIS — B353 Tinea pedis: Secondary | ICD-10-CM | POA: Diagnosis not present

## 2017-01-05 DIAGNOSIS — L270 Generalized skin eruption due to drugs and medicaments taken internally: Secondary | ICD-10-CM | POA: Diagnosis not present

## 2017-01-11 ENCOUNTER — Other Ambulatory Visit: Payer: Self-pay | Admitting: Family Medicine

## 2017-01-11 DIAGNOSIS — L299 Pruritus, unspecified: Secondary | ICD-10-CM | POA: Diagnosis not present

## 2017-01-11 DIAGNOSIS — B353 Tinea pedis: Secondary | ICD-10-CM | POA: Diagnosis not present

## 2017-01-11 DIAGNOSIS — F3341 Major depressive disorder, recurrent, in partial remission: Secondary | ICD-10-CM

## 2017-01-11 DIAGNOSIS — L270 Generalized skin eruption due to drugs and medicaments taken internally: Secondary | ICD-10-CM | POA: Diagnosis not present

## 2017-01-16 DIAGNOSIS — J181 Lobar pneumonia, unspecified organism: Secondary | ICD-10-CM | POA: Diagnosis not present

## 2017-01-16 DIAGNOSIS — R918 Other nonspecific abnormal finding of lung field: Secondary | ICD-10-CM | POA: Diagnosis not present

## 2017-01-17 ENCOUNTER — Ambulatory Visit (INDEPENDENT_AMBULATORY_CARE_PROVIDER_SITE_OTHER): Payer: Medicare Other | Admitting: Family Medicine

## 2017-01-17 ENCOUNTER — Encounter: Payer: Self-pay | Admitting: Family Medicine

## 2017-01-17 VITALS — BP 145/74 | HR 82 | Temp 95.0°F | Wt 201.0 lb

## 2017-01-17 DIAGNOSIS — R911 Solitary pulmonary nodule: Secondary | ICD-10-CM

## 2017-01-17 DIAGNOSIS — R059 Cough, unspecified: Secondary | ICD-10-CM

## 2017-01-17 DIAGNOSIS — R05 Cough: Secondary | ICD-10-CM | POA: Diagnosis not present

## 2017-01-17 DIAGNOSIS — E507 Other ocular manifestations of vitamin A deficiency: Secondary | ICD-10-CM | POA: Diagnosis not present

## 2017-01-17 NOTE — Progress Notes (Signed)
Carlos Harrington is a 84 y.o. male who presents to [facility] today for rash and lungs.   Ziair was seen about 1 month ago for rash.  He has been seen by dermatology and podiatry for his blistering rash.  He notes it is stable to improved and feeling well.  He denies any fevers or chills.  Lungs: Carlos Harrington has been seen for recurrent pneumonia.  He is currently in the process of being worked up for persistent pneumonia due to lung collapse.  This was concerning for a malignant process however he had a bronchoscopy in the cytology and BAL were negative for malignancy.  He had a PET CT scan yesterday with his pulmonologist but the results are not yet back.  He feels pretty well and notes that the cough is still present but improved and he is breathing well without severe shortness of breath.   Past Medical History:  Diagnosis Date  . Anxiety   . CAD (coronary artery disease)    with stenting X 4 in [place]  . Depression   . Hyperlipidemia   . Hypertension   . Macular degeneration   . Mild cognitive impairment 01/07/2016   Past Surgical History:  Procedure Laterality Date  . APPENDECTOMY    . CHOLECYSTECTOMY    . TONSILLECTOMY     Social History   Tobacco Use  . Smoking status: Never Smoker  . Smokeless tobacco: Never Used  Substance Use Topics  . Alcohol use: Yes    Comment: 2 daily   family history includes Depression in his mother; Diabetes in his mother; Hyperlipidemia in his mother; Hypertension in his mother; Other in his brother, mother, and sister.  ROS as above:  Medications: Current Outpatient Medications  Medication Sig Dispense Refill  . albuterol (PROVENTIL HFA;VENTOLIN HFA) 108 (90 Base) MCG/ACT inhaler Inhale 1-2 puffs into the lungs every 6 (six) hours as needed for wheezing or shortness of breath. 1 Inhaler 1  . albuterol (PROVENTIL) (2.5 MG/3ML) 0.083%  nebulizer solution Take 3 mLs (2.5 mg total) by nebulization every 6 (six) hours as needed for wheezing or shortness of breath. 150 mL 1  . AMBULATORY NON FORMULARY MEDICATION Stair Lift at home for use as needed. 1 each 0  . AMBULATORY NON FORMULARY MEDICATION Continuous positive airway pressure (CPAP) machine auto-titrate from 4-20 cm of H2O pressure, with all supplemental supplies as needed. 1 each 0  . apixaban (ELIQUIS) 5 MG TABS tablet Take 1 tablet (5 mg total) by mouth 2 (two) times daily. 60 tablet 6  . atorvastatin (LIPITOR) 40 MG tablet Take 40 mg by mouth daily.  11  . cetirizine (ZYRTEC) 10 MG tablet Take 1 tablet (10 mg total) by mouth daily. 90 tablet 1  . clobetasol cream (TEMOVATE) 9.73 % Apply 1 application topically 2 (two) times daily. 30 g 0  . doxazosin (CARDURA) 4 MG tablet TAKE ONE TABLET BY MOUTH AT BEDTIME 28 tablet 1  . furosemide (LASIX) 40 MG tablet Take one tablet (40 mg total) by mouth daily. 30 tablet 1  . guaiFENesin-codeine 100-10 MG/5ML syrup Take 5 mLs by mouth at bedtime as needed for cough. 180 mL 0  . levofloxacin (LEVAQUIN) 500 MG tablet Take 1 tablet (500 mg total) by mouth daily. 7 tablet 0  . QC LO-DOSE ASPIRIN 81 MG EC tablet TAKE ONE TABLET BY MOUTH EVERY DAY 30 tablet 1  . sertraline (ZOLOFT) 100 MG tablet TAKE  ONE TABLET BY MOUTH EVERY DAY 30 tablet 0  . triamcinolone cream (KENALOG) 0.1 % Apply 1 application topically 2 (two) times daily. 453.6 g 12  . vitamin B-12 (CYANOCOBALAMIN) 1000 MCG tablet Take one tablet by mouth daily.  28 tablet 5   No current facility-administered medications for this visit.    Allergies  Allergen Reactions  . Amlodipine Itching  . Carvedilol Itching    Health Maintenance Health Maintenance  Topic Date Due  . TETANUS/TDAP  07/26/2024  . INFLUENZA VACCINE  Completed  . PNA vac Low Risk Adult  Completed     Exam:  BP (!) 165/73   Pulse 85   Temp (!) 95 F (35 C)   Wt 201 lb (91.2 kg)   BMI 31.48 kg/m    Gen: Well NAD nontoxic-appearing HEENT: EOMI,  MMM Lungs: Normal work of breathing.  Coarse breath sounds Heart: RRR no MRG Abd: NABS, Soft. Nondistended, Nontender Exts: Brisk capillary refill, warm and well perfused.  Skin: Erythematous maculopapular rash on upper extremities.  Nontender.  Blanchable.   No results found for this or any previous visit (from the past 72 hour(s)). No results found.    Assessment and Plan: 84 y.o. male with  Rash: Unclear etiology.  Co-managed with dermatology.  Continue current management and watchful waiting.  Recheck sooner if needed.  Lungs: Carlos Harrington is still in the process of being evaluated by pulmonology for recurrent pneumonia with lung collapse.  This is still quite concerning for malignancy however the initial bronchoscopy was negative.  PET CT scan results should be available soon and should be helpful.  I will continue to follow along with pulmonology and will recheck in about 2 months or sooner if needed.   No orders of the defined types were placed in this encounter.  No orders of the defined types were placed in this encounter.    Discussed warning signs or symptoms. Please see discharge instructions. Patient expresses understanding.

## 2017-01-17 NOTE — Patient Instructions (Signed)
Thank you for coming in today. Continue current treatment.  Recheck in 2 months regarding rash and lungs.

## 2017-01-18 DIAGNOSIS — B353 Tinea pedis: Secondary | ICD-10-CM | POA: Diagnosis not present

## 2017-01-18 DIAGNOSIS — S90822D Blister (nonthermal), left foot, subsequent encounter: Secondary | ICD-10-CM | POA: Diagnosis not present

## 2017-01-18 DIAGNOSIS — L853 Xerosis cutis: Secondary | ICD-10-CM | POA: Diagnosis not present

## 2017-01-18 DIAGNOSIS — L270 Generalized skin eruption due to drugs and medicaments taken internally: Secondary | ICD-10-CM | POA: Diagnosis not present

## 2017-01-18 DIAGNOSIS — S90821D Blister (nonthermal), right foot, subsequent encounter: Secondary | ICD-10-CM | POA: Diagnosis not present

## 2017-01-18 DIAGNOSIS — I10 Essential (primary) hypertension: Secondary | ICD-10-CM | POA: Diagnosis not present

## 2017-01-23 DIAGNOSIS — R05 Cough: Secondary | ICD-10-CM | POA: Diagnosis not present

## 2017-01-23 DIAGNOSIS — R972 Elevated prostate specific antigen [PSA]: Secondary | ICD-10-CM | POA: Diagnosis not present

## 2017-01-23 DIAGNOSIS — R590 Localized enlarged lymph nodes: Secondary | ICD-10-CM | POA: Diagnosis not present

## 2017-01-23 DIAGNOSIS — J181 Lobar pneumonia, unspecified organism: Secondary | ICD-10-CM | POA: Diagnosis not present

## 2017-01-23 DIAGNOSIS — I10 Essential (primary) hypertension: Secondary | ICD-10-CM | POA: Diagnosis not present

## 2017-01-26 DIAGNOSIS — I10 Essential (primary) hypertension: Secondary | ICD-10-CM | POA: Diagnosis not present

## 2017-01-26 DIAGNOSIS — N401 Enlarged prostate with lower urinary tract symptoms: Secondary | ICD-10-CM | POA: Diagnosis not present

## 2017-01-26 DIAGNOSIS — R972 Elevated prostate specific antigen [PSA]: Secondary | ICD-10-CM | POA: Diagnosis not present

## 2017-01-26 DIAGNOSIS — N3943 Post-void dribbling: Secondary | ICD-10-CM | POA: Diagnosis not present

## 2017-01-29 ENCOUNTER — Other Ambulatory Visit: Payer: Self-pay | Admitting: Family Medicine

## 2017-01-29 DIAGNOSIS — E538 Deficiency of other specified B group vitamins: Secondary | ICD-10-CM

## 2017-01-29 DIAGNOSIS — I2510 Atherosclerotic heart disease of native coronary artery without angina pectoris: Secondary | ICD-10-CM

## 2017-01-29 DIAGNOSIS — I10 Essential (primary) hypertension: Secondary | ICD-10-CM

## 2017-01-30 DIAGNOSIS — R21 Rash and other nonspecific skin eruption: Secondary | ICD-10-CM | POA: Diagnosis not present

## 2017-01-30 DIAGNOSIS — T148XXA Other injury of unspecified body region, initial encounter: Secondary | ICD-10-CM | POA: Diagnosis not present

## 2017-01-30 DIAGNOSIS — R591 Generalized enlarged lymph nodes: Secondary | ICD-10-CM | POA: Diagnosis not present

## 2017-01-30 DIAGNOSIS — G4733 Obstructive sleep apnea (adult) (pediatric): Secondary | ICD-10-CM | POA: Diagnosis not present

## 2017-01-30 DIAGNOSIS — R590 Localized enlarged lymph nodes: Secondary | ICD-10-CM | POA: Diagnosis not present

## 2017-01-31 DIAGNOSIS — D803 Selective deficiency of immunoglobulin G [IgG] subclasses: Secondary | ICD-10-CM | POA: Diagnosis not present

## 2017-01-31 DIAGNOSIS — I10 Essential (primary) hypertension: Secondary | ICD-10-CM | POA: Diagnosis not present

## 2017-01-31 DIAGNOSIS — Z5181 Encounter for therapeutic drug level monitoring: Secondary | ICD-10-CM | POA: Diagnosis not present

## 2017-01-31 DIAGNOSIS — I5032 Chronic diastolic (congestive) heart failure: Secondary | ICD-10-CM | POA: Diagnosis not present

## 2017-01-31 DIAGNOSIS — Z955 Presence of coronary angioplasty implant and graft: Secondary | ICD-10-CM | POA: Diagnosis not present

## 2017-01-31 DIAGNOSIS — I2510 Atherosclerotic heart disease of native coronary artery without angina pectoris: Secondary | ICD-10-CM | POA: Diagnosis not present

## 2017-01-31 DIAGNOSIS — D2372 Other benign neoplasm of skin of left lower limb, including hip: Secondary | ICD-10-CM | POA: Diagnosis not present

## 2017-01-31 DIAGNOSIS — L120 Bullous pemphigoid: Secondary | ICD-10-CM | POA: Diagnosis not present

## 2017-01-31 DIAGNOSIS — Z79899 Other long term (current) drug therapy: Secondary | ICD-10-CM | POA: Diagnosis not present

## 2017-01-31 DIAGNOSIS — I252 Old myocardial infarction: Secondary | ICD-10-CM | POA: Diagnosis not present

## 2017-01-31 DIAGNOSIS — I480 Paroxysmal atrial fibrillation: Secondary | ICD-10-CM | POA: Diagnosis not present

## 2017-01-31 DIAGNOSIS — R21 Rash and other nonspecific skin eruption: Secondary | ICD-10-CM | POA: Diagnosis not present

## 2017-02-05 ENCOUNTER — Other Ambulatory Visit: Payer: Self-pay | Admitting: Family Medicine

## 2017-02-05 DIAGNOSIS — E538 Deficiency of other specified B group vitamins: Secondary | ICD-10-CM

## 2017-02-05 DIAGNOSIS — F3341 Major depressive disorder, recurrent, in partial remission: Secondary | ICD-10-CM

## 2017-02-07 DIAGNOSIS — I5030 Unspecified diastolic (congestive) heart failure: Secondary | ICD-10-CM | POA: Insufficient documentation

## 2017-02-08 DIAGNOSIS — G4733 Obstructive sleep apnea (adult) (pediatric): Secondary | ICD-10-CM | POA: Diagnosis not present

## 2017-02-08 DIAGNOSIS — Z955 Presence of coronary angioplasty implant and graft: Secondary | ICD-10-CM | POA: Diagnosis not present

## 2017-02-08 DIAGNOSIS — Z7901 Long term (current) use of anticoagulants: Secondary | ICD-10-CM | POA: Diagnosis not present

## 2017-02-08 DIAGNOSIS — I252 Old myocardial infarction: Secondary | ICD-10-CM | POA: Diagnosis not present

## 2017-02-08 DIAGNOSIS — I480 Paroxysmal atrial fibrillation: Secondary | ICD-10-CM | POA: Diagnosis not present

## 2017-02-08 DIAGNOSIS — J209 Acute bronchitis, unspecified: Secondary | ICD-10-CM | POA: Diagnosis not present

## 2017-02-08 DIAGNOSIS — J9809 Other diseases of bronchus, not elsewhere classified: Secondary | ICD-10-CM | POA: Diagnosis not present

## 2017-02-08 DIAGNOSIS — R591 Generalized enlarged lymph nodes: Secondary | ICD-10-CM | POA: Diagnosis not present

## 2017-02-08 DIAGNOSIS — Z888 Allergy status to other drugs, medicaments and biological substances status: Secondary | ICD-10-CM | POA: Diagnosis not present

## 2017-02-08 DIAGNOSIS — F329 Major depressive disorder, single episode, unspecified: Secondary | ICD-10-CM | POA: Diagnosis not present

## 2017-02-08 DIAGNOSIS — Z79899 Other long term (current) drug therapy: Secondary | ICD-10-CM | POA: Diagnosis not present

## 2017-02-08 DIAGNOSIS — I110 Hypertensive heart disease with heart failure: Secondary | ICD-10-CM | POA: Diagnosis not present

## 2017-02-08 DIAGNOSIS — J9811 Atelectasis: Secondary | ICD-10-CM | POA: Diagnosis not present

## 2017-02-08 DIAGNOSIS — R918 Other nonspecific abnormal finding of lung field: Secondary | ICD-10-CM | POA: Diagnosis not present

## 2017-02-08 DIAGNOSIS — L304 Erythema intertrigo: Secondary | ICD-10-CM | POA: Diagnosis not present

## 2017-02-08 DIAGNOSIS — J45909 Unspecified asthma, uncomplicated: Secondary | ICD-10-CM | POA: Diagnosis not present

## 2017-02-08 DIAGNOSIS — I5030 Unspecified diastolic (congestive) heart failure: Secondary | ICD-10-CM | POA: Diagnosis not present

## 2017-02-08 DIAGNOSIS — Z7982 Long term (current) use of aspirin: Secondary | ICD-10-CM | POA: Diagnosis not present

## 2017-02-08 DIAGNOSIS — Z885 Allergy status to narcotic agent status: Secondary | ICD-10-CM | POA: Diagnosis not present

## 2017-02-08 DIAGNOSIS — I2510 Atherosclerotic heart disease of native coronary artery without angina pectoris: Secondary | ICD-10-CM | POA: Diagnosis not present

## 2017-02-15 ENCOUNTER — Other Ambulatory Visit: Payer: Self-pay | Admitting: Family Medicine

## 2017-02-15 DIAGNOSIS — E538 Deficiency of other specified B group vitamins: Secondary | ICD-10-CM

## 2017-02-21 DIAGNOSIS — Z955 Presence of coronary angioplasty implant and graft: Secondary | ICD-10-CM | POA: Diagnosis not present

## 2017-02-21 DIAGNOSIS — I2510 Atherosclerotic heart disease of native coronary artery without angina pectoris: Secondary | ICD-10-CM | POA: Diagnosis not present

## 2017-02-21 DIAGNOSIS — I252 Old myocardial infarction: Secondary | ICD-10-CM | POA: Diagnosis not present

## 2017-02-21 DIAGNOSIS — I480 Paroxysmal atrial fibrillation: Secondary | ICD-10-CM | POA: Diagnosis not present

## 2017-02-21 DIAGNOSIS — I5032 Chronic diastolic (congestive) heart failure: Secondary | ICD-10-CM | POA: Diagnosis not present

## 2017-02-21 DIAGNOSIS — I10 Essential (primary) hypertension: Secondary | ICD-10-CM | POA: Diagnosis not present

## 2017-02-28 DIAGNOSIS — R21 Rash and other nonspecific skin eruption: Secondary | ICD-10-CM | POA: Diagnosis not present

## 2017-02-28 DIAGNOSIS — Z7952 Long term (current) use of systemic steroids: Secondary | ICD-10-CM | POA: Diagnosis not present

## 2017-02-28 DIAGNOSIS — D2372 Other benign neoplasm of skin of left lower limb, including hip: Secondary | ICD-10-CM | POA: Diagnosis not present

## 2017-02-28 DIAGNOSIS — Z79899 Other long term (current) drug therapy: Secondary | ICD-10-CM | POA: Diagnosis not present

## 2017-02-28 DIAGNOSIS — L120 Bullous pemphigoid: Secondary | ICD-10-CM | POA: Diagnosis not present

## 2017-02-28 DIAGNOSIS — Z5181 Encounter for therapeutic drug level monitoring: Secondary | ICD-10-CM | POA: Diagnosis not present

## 2017-02-28 DIAGNOSIS — L299 Pruritus, unspecified: Secondary | ICD-10-CM | POA: Diagnosis not present

## 2017-03-04 DIAGNOSIS — D72829 Elevated white blood cell count, unspecified: Secondary | ICD-10-CM | POA: Diagnosis not present

## 2017-03-04 DIAGNOSIS — R918 Other nonspecific abnormal finding of lung field: Secondary | ICD-10-CM | POA: Diagnosis not present

## 2017-03-04 DIAGNOSIS — Z8673 Personal history of transient ischemic attack (TIA), and cerebral infarction without residual deficits: Secondary | ICD-10-CM | POA: Diagnosis not present

## 2017-03-04 DIAGNOSIS — R05 Cough: Secondary | ICD-10-CM | POA: Diagnosis not present

## 2017-03-04 DIAGNOSIS — R0902 Hypoxemia: Secondary | ICD-10-CM | POA: Diagnosis not present

## 2017-03-04 DIAGNOSIS — L120 Bullous pemphigoid: Secondary | ICD-10-CM | POA: Insufficient documentation

## 2017-03-04 DIAGNOSIS — R748 Abnormal levels of other serum enzymes: Secondary | ICD-10-CM | POA: Diagnosis not present

## 2017-03-04 DIAGNOSIS — R590 Localized enlarged lymph nodes: Secondary | ICD-10-CM | POA: Diagnosis not present

## 2017-03-04 DIAGNOSIS — H9190 Unspecified hearing loss, unspecified ear: Secondary | ICD-10-CM | POA: Diagnosis present

## 2017-03-04 DIAGNOSIS — J156 Pneumonia due to other aerobic Gram-negative bacteria: Secondary | ICD-10-CM | POA: Diagnosis not present

## 2017-03-04 DIAGNOSIS — I517 Cardiomegaly: Secondary | ICD-10-CM | POA: Diagnosis not present

## 2017-03-04 DIAGNOSIS — E162 Hypoglycemia, unspecified: Secondary | ICD-10-CM | POA: Diagnosis present

## 2017-03-04 DIAGNOSIS — A419 Sepsis, unspecified organism: Secondary | ICD-10-CM | POA: Diagnosis not present

## 2017-03-04 DIAGNOSIS — I10 Essential (primary) hypertension: Secondary | ICD-10-CM | POA: Diagnosis not present

## 2017-03-04 DIAGNOSIS — Z9089 Acquired absence of other organs: Secondary | ICD-10-CM | POA: Diagnosis not present

## 2017-03-04 DIAGNOSIS — E785 Hyperlipidemia, unspecified: Secondary | ICD-10-CM | POA: Diagnosis present

## 2017-03-04 DIAGNOSIS — I5032 Chronic diastolic (congestive) heart failure: Secondary | ICD-10-CM | POA: Diagnosis not present

## 2017-03-04 DIAGNOSIS — J189 Pneumonia, unspecified organism: Secondary | ICD-10-CM | POA: Diagnosis not present

## 2017-03-04 DIAGNOSIS — Z888 Allergy status to other drugs, medicaments and biological substances status: Secondary | ICD-10-CM | POA: Diagnosis not present

## 2017-03-04 DIAGNOSIS — Z886 Allergy status to analgesic agent status: Secondary | ICD-10-CM | POA: Diagnosis not present

## 2017-03-04 DIAGNOSIS — Z66 Do not resuscitate: Secondary | ICD-10-CM | POA: Diagnosis present

## 2017-03-04 DIAGNOSIS — J449 Chronic obstructive pulmonary disease, unspecified: Secondary | ICD-10-CM | POA: Diagnosis not present

## 2017-03-04 DIAGNOSIS — Z9049 Acquired absence of other specified parts of digestive tract: Secondary | ICD-10-CM | POA: Diagnosis not present

## 2017-03-04 DIAGNOSIS — I110 Hypertensive heart disease with heart failure: Secondary | ICD-10-CM | POA: Diagnosis not present

## 2017-03-04 DIAGNOSIS — E7849 Other hyperlipidemia: Secondary | ICD-10-CM | POA: Diagnosis not present

## 2017-03-04 DIAGNOSIS — R509 Fever, unspecified: Secondary | ICD-10-CM | POA: Diagnosis not present

## 2017-03-04 DIAGNOSIS — R739 Hyperglycemia, unspecified: Secondary | ICD-10-CM | POA: Diagnosis not present

## 2017-03-04 DIAGNOSIS — J9601 Acute respiratory failure with hypoxia: Secondary | ICD-10-CM | POA: Diagnosis not present

## 2017-03-04 DIAGNOSIS — Z48813 Encounter for surgical aftercare following surgery on the respiratory system: Secondary | ICD-10-CM | POA: Diagnosis not present

## 2017-03-08 MED ORDER — SODIUM CHLORIDE 0.9 % IV SOLN
INTRAVENOUS | Status: DC
Start: ? — End: 2017-03-08

## 2017-03-08 MED ORDER — GENERIC EXTERNAL MEDICATION
Status: DC
Start: ? — End: 2017-03-08

## 2017-03-08 MED ORDER — HYDROXYZINE HCL 10 MG PO TABS
10.00 | ORAL_TABLET | ORAL | Status: DC
Start: ? — End: 2017-03-08

## 2017-03-08 MED ORDER — HYDRALAZINE HCL 20 MG/ML IJ SOLN
10.00 | INTRAMUSCULAR | Status: DC
Start: ? — End: 2017-03-08

## 2017-03-08 MED ORDER — GENERIC EXTERNAL MEDICATION
Status: DC
Start: 2017-03-08 — End: 2017-03-08

## 2017-03-08 MED ORDER — MYCOPHENOLATE MOFETIL 500 MG PO TABS
1000.00 | ORAL_TABLET | ORAL | Status: DC
Start: 2017-03-08 — End: 2017-03-08

## 2017-03-08 MED ORDER — GENERIC EXTERNAL MEDICATION
3.38 | Status: DC
Start: 2017-03-08 — End: 2017-03-08

## 2017-03-08 MED ORDER — ACETAMINOPHEN 650 MG RE SUPP
650.00 | RECTAL | Status: DC
Start: ? — End: 2017-03-08

## 2017-03-08 MED ORDER — RIVAROXABAN 10 MG PO TABS
20.00 | ORAL_TABLET | ORAL | Status: DC
Start: 2017-03-09 — End: 2017-03-08

## 2017-03-08 MED ORDER — DIPHENHYDRAMINE HCL 25 MG PO CAPS
25.00 | ORAL_CAPSULE | ORAL | Status: DC
Start: ? — End: 2017-03-08

## 2017-03-08 MED ORDER — ATORVASTATIN CALCIUM 40 MG PO TABS
40.00 | ORAL_TABLET | ORAL | Status: DC
Start: 2017-03-09 — End: 2017-03-08

## 2017-03-08 MED ORDER — SERTRALINE HCL 100 MG PO TABS
100.00 | ORAL_TABLET | ORAL | Status: DC
Start: 2017-03-09 — End: 2017-03-08

## 2017-03-08 MED ORDER — TORSEMIDE 20 MG PO TABS
40.00 | ORAL_TABLET | ORAL | Status: DC
Start: 2017-03-09 — End: 2017-03-08

## 2017-03-08 MED ORDER — ASPIRIN EC 81 MG PO TBEC
81.00 | DELAYED_RELEASE_TABLET | ORAL | Status: DC
Start: 2017-03-09 — End: 2017-03-08

## 2017-03-08 MED ORDER — GUAIFENESIN 100 MG/5ML PO LIQD
200.00 | ORAL | Status: DC
Start: ? — End: 2017-03-08

## 2017-03-08 MED ORDER — ALBUTEROL SULFATE (2.5 MG/3ML) 0.083% IN NEBU
2.50 | INHALATION_SOLUTION | RESPIRATORY_TRACT | Status: DC
Start: ? — End: 2017-03-08

## 2017-03-08 MED ORDER — NITROGLYCERIN 0.4 MG SL SUBL
0.40 | SUBLINGUAL_TABLET | SUBLINGUAL | Status: DC
Start: ? — End: 2017-03-08

## 2017-03-08 MED ORDER — GENERIC EXTERNAL MEDICATION
60.00 | Status: DC
Start: 2017-03-09 — End: 2017-03-08

## 2017-03-08 MED ORDER — ACETAMINOPHEN 325 MG PO TABS
650.00 | ORAL_TABLET | ORAL | Status: DC
Start: ? — End: 2017-03-08

## 2017-03-08 MED ORDER — VITAMIN B-12 1000 MCG PO TABS
ORAL_TABLET | ORAL | Status: DC
Start: 2017-03-09 — End: 2017-03-08

## 2017-03-08 MED ORDER — PHENOL 1.4 % MT LIQD
OROMUCOSAL | Status: DC
Start: ? — End: 2017-03-08

## 2017-03-08 MED ORDER — DOXAZOSIN MESYLATE 2 MG PO TABS
4.00 | ORAL_TABLET | ORAL | Status: DC
Start: 2017-03-08 — End: 2017-03-08

## 2017-03-09 ENCOUNTER — Telehealth: Payer: Self-pay | Admitting: Family Medicine

## 2017-03-09 NOTE — Telephone Encounter (Signed)
Called and got patient scheduled for Monday 03/12/17 w Dr. Georgina Snell at 10:50 am

## 2017-03-09 NOTE — Telephone Encounter (Signed)
Shanard was discharged from the hospital on the seventh.  Please arrange follow-up in my clinic for hospital follow-up.

## 2017-03-09 NOTE — Telephone Encounter (Signed)
Routing to scheduler

## 2017-03-10 MED ORDER — GENERIC EXTERNAL MEDICATION
Status: DC
Start: ? — End: 2017-03-10

## 2017-03-12 ENCOUNTER — Ambulatory Visit (INDEPENDENT_AMBULATORY_CARE_PROVIDER_SITE_OTHER): Payer: Medicare Other | Admitting: Family Medicine

## 2017-03-12 ENCOUNTER — Encounter: Payer: Self-pay | Admitting: Family Medicine

## 2017-03-12 VITALS — BP 116/74 | HR 100 | Wt 182.0 lb

## 2017-03-12 DIAGNOSIS — M25551 Pain in right hip: Secondary | ICD-10-CM

## 2017-03-12 DIAGNOSIS — R918 Other nonspecific abnormal finding of lung field: Secondary | ICD-10-CM | POA: Diagnosis not present

## 2017-03-12 DIAGNOSIS — R911 Solitary pulmonary nodule: Secondary | ICD-10-CM

## 2017-03-12 DIAGNOSIS — K120 Recurrent oral aphthae: Secondary | ICD-10-CM | POA: Diagnosis not present

## 2017-03-12 DIAGNOSIS — Z66 Do not resuscitate: Secondary | ICD-10-CM | POA: Diagnosis not present

## 2017-03-12 DIAGNOSIS — I5041 Acute combined systolic (congestive) and diastolic (congestive) heart failure: Secondary | ICD-10-CM | POA: Diagnosis not present

## 2017-03-12 DIAGNOSIS — N183 Chronic kidney disease, stage 3 unspecified: Secondary | ICD-10-CM

## 2017-03-12 DIAGNOSIS — I2510 Atherosclerotic heart disease of native coronary artery without angina pectoris: Secondary | ICD-10-CM | POA: Diagnosis not present

## 2017-03-12 DIAGNOSIS — J189 Pneumonia, unspecified organism: Secondary | ICD-10-CM

## 2017-03-12 MED ORDER — TRIAMCINOLONE ACETONIDE 0.1 % MT PSTE: 1.0000 "application " | PASTE | Freq: Two times a day (BID) | OROMUCOSAL | 11 refills | Status: AC

## 2017-03-12 MED ORDER — HYDROXYZINE HCL 25 MG PO TABS: 25.0000 mg | ORAL_TABLET | Freq: Three times a day (TID) | ORAL | 5 refills | Status: AC | PRN

## 2017-03-12 NOTE — Progress Notes (Signed)
Carlos Harrington is a 84 y.o. male with a history of recent pneumonia, bullous pemphigoid, CAD, hypertension, and hearing impairment who presents to [facility]: [place] today for follow up after hospitalization from 3/3 to 3/7 for gram-negative pneumonia. The patient and his daughter report that he has improved substantially since the hospital stay. He has lost approximately 24 lbs of fluids on 20mg  of torsemide. He was taken off of his furosemide as it was believed to be inaffective and possibly the causative agent of his bullous pemphigoid rash present diffusely throughout his body. The daughter reports that his rash has substantially improved since switching medications. The patient remains short of breath and still has a right lower lobe consolidation that still has an unclear etiology after two negative bronchoscopies. Patient's daughter also notes that the patient has moderate right leg weakness as well as a large bruise on the right inner thigh, possibly from a fall onto a coffee table a few weeks ago prior to hospitalization.    Past Medical History:  Diagnosis Date  . Anxiety   . CAD (coronary artery disease)    with stenting X 4 in [place]  . Depression   . Hyperlipidemia   . Hypertension   . Macular degeneration   . Mild cognitive impairment 01/07/2016   Past Surgical History:  Procedure Laterality Date  . APPENDECTOMY    . CHOLECYSTECTOMY    . TONSILLECTOMY     Social History   Tobacco Use  . Smoking status: Never Smoker  . Smokeless tobacco: Never Used  Substance Use Topics  . Alcohol use: Yes    Comment: 2 daily   family history includes Depression in his mother; Diabetes in his mother; Hyperlipidemia in his mother; Hypertension in his mother; Other in his brother, mother, and sister.  ROS as above: No headache, visual changes, nausea, vomiting, diarrhea, constipation,  dizziness, abdominal pain, worsening skin rash, fevers, chills, night sweats, , swollen lymph nodes,joint swelling, muscle aches, chest pain, worsening shortness of breath, mood changes, visual or auditory hallucinations.   Medications: Current Outpatient Medications  Medication Sig Dispense Refill  . albuterol (PROVENTIL HFA;VENTOLIN HFA) 108 (90 Base) MCG/ACT inhaler Inhale 1-2 puffs into the lungs every 6 (six) hours as needed for wheezing or shortness of breath. 1 Inhaler 1  . albuterol (PROVENTIL) (2.5 MG/3ML) 0.083% nebulizer solution Take 3 mLs (2.5 mg total) by nebulization every 6 (six) hours as needed for wheezing or shortness of breath. 150 mL 1  . AMBULATORY NON FORMULARY MEDICATION Stair Lift at home for use as needed. 1 each 0  . AMBULATORY NON FORMULARY MEDICATION Continuous positive airway pressure (CPAP) machine auto-titrate from 4-20 cm of H2O pressure, with all supplemental supplies as needed. 1 each 0  . atorvastatin (LIPITOR) 40 MG tablet Take 40 mg by mouth daily.  11  . cetirizine (ZYRTEC) 10 MG tablet Take 1 tablet (10 mg total) by mouth daily. 90 tablet 1  . clobetasol cream (TEMOVATE) 9.14 % Apply 1 application topically 2 (two) times daily. 30 g 0  . doxazosin (CARDURA) 4 MG tablet TAKE ONE TABLET BY MOUTH AT BEDTIME 28 tablet 1  . guaiFENesin-codeine 100-10 MG/5ML syrup Take 5 mLs by mouth at bedtime as needed for cough. 180 mL 0  . hydrOXYzine (ATARAX/VISTARIL) 25 MG tablet Take 1-2 tablets (25-50 mg total) by mouth every 8 (eight) hours as needed. itching 90 tablet 5  . mycophenolate (CELLCEPT) 500 MG tablet  Take 1,000 mg by mouth 2 (two) times daily.  1  . predniSONE (DELTASONE) 20 MG tablet Take 60 mg by mouth daily.  0  . QC LO-DOSE ASPIRIN 81 MG EC tablet TAKE ONE TABLET BY MOUTH EVERY DAY 30 tablet 1  . sertraline (ZOLOFT) 100 MG tablet TAKE ONE TABLET BY MOUTH EVERY DAY 30 tablet 0  . torsemide (DEMADEX) 20 MG tablet Take 20 mg by mouth daily.  0  .  triamcinolone cream (KENALOG) 0.1 % Apply 1 application topically 2 (two) times daily. 453.6 g 12  . vitamin B-12 (CYANOCOBALAMIN) 1000 MCG tablet Take one tablet by mouth daily 28 tablet 5  . XARELTO 20 MG TABS tablet Take one tablet (20 mg total) by mouth daily.  5  . triamcinolone (KENALOG) 0.1 % paste Use as directed 1 application in the mouth or throat 2 (two) times daily. 5 g 11   No current facility-administered medications for this visit.    Allergies  Allergen Reactions  . Amlodipine Itching  . Carvedilol Itching    Health Maintenance Health Maintenance  Topic Date Due  . TETANUS/TDAP  07/26/2024  . INFLUENZA VACCINE  Completed  . PNA vac Low Risk Adult  Completed     Exam:  BP 116/74   Pulse 100   Wt 182 lb (82.6 kg)   SpO2 97%   BMI 28.51 kg/m  Gen: Elderly appearing. Non-toxic appearing.  HEENT: EOMI,  MMM, a few aphthous ulcers beneath the tongue and on the left inner cheak Lungs: Mildly increased work of breathing. Course breath sounds throughout. Right lower lobe increased breath sounds. Heart: tachycardic, regular rhythm, no MRG Abd: NABS, Soft. Nondistended, Nontender Exts: Brisk capillary refill, warm and well perfused. Large contusion on medial right upper extremity.   Skin: Body diffusely covered healing scaly ovoid lesions with some excoriation and scabs.  MSK: Right lower limb weak to hip flexion.  TTP right anterior thigh.   Xray hip right ordered but not yet done.   Labs from recent hospitalization reviewed in care everywhere.      Assessment and Plan: 84 y.o. male with an history of severe shortness of breath with a known idiopathic right lung consolidation, bullous pemphigoid rash diffuse over body, aphthous ulcers, and right lower extremity weakness. The patient was elderly and frail but appeared non-toxic.   Diastolic Heart failure and fluid overload:  He seems to be improving with the torsemide and has continued to lose fluid weight. Plan  to continue torsemide and recheck CMP. Follow up cardiology as arranged in 2 weeks. Return to my clinic for follow up in 3 weeks.   CKD: Recheck metabolic panel to follow-up renal function on new diuretic.  Right Lobe Mass: Unclear etiology concerning for malignancy.  Cytology negative x2 however.  Patient has follow-up in the near future with pulmonology to continue to address this issue.  In the meantime will continue good supportive care  Bullous Pemphigoid: Seems to be improving after medication switch from furosemide to torsemide. Continue on prednisone and mycophenolate. Continue monitoring.   Right lower leg weakness/Contusion: Hip X-ray ordered pending.  I suspect DJD is a likely cause of factor.  Patient already has home health physical therapy arranged.  Recheck in 3 weeks.  Aphthous Ulcers: I believe this is unrelated to his bullous pemphigoid but obviously concerning based on his constellation of symptoms and complex medical problems.  Prescribed topical triamcinolone oral paste  Recheck in 3 weeks.  Patient continues to worsen consider  goals of care. DNR currently.  Orders Placed This Encounter  Procedures  . DG HIP UNILAT WITH PELVIS 2-3 VIEWS RIGHT    Standing Status:   Future    Standing Expiration Date:   05/13/2018    Order Specific Question:   Reason for Exam (SYMPTOM  OR DIAGNOSIS REQUIRED)    Answer:   eval pain right thigh  suspect hip djd    Order Specific Question:   Preferred imaging location?    Answer:   [facility]    Order Specific Question:   Radiology Contrast Protocol - do NOT remove file path    Answer:   [email or web address]  . COMPLETE METABOLIC PANEL WITH GFR   Meds ordered this encounter  Medications  . triamcinolone (KENALOG) 0.1 % paste    Sig: Use as directed 1 application in the mouth or throat 2 (two) times daily.    Dispense:  5 g    Refill:  11  . hydrOXYzine (ATARAX/VISTARIL) 25 MG tablet     Sig: Take 1-2 tablets (25-50 mg total) by mouth every 8 (eight) hours as needed. itching    Dispense:  90 tablet    Refill:  5     Discussed warning signs or symptoms. Please see discharge instructions. Patient expresses understanding.

## 2017-03-12 NOTE — Progress Notes (Signed)
Extra note opened in error

## 2017-03-12 NOTE — Patient Instructions (Addendum)
Thank you for coming in today. STOP Lasix.  Continue torsemide Get labs now.  Recheck in 3 weeks.  Apply the Triamolone paste to the sores in the mouth 2x daily.  Return sooner if needed.   Get xray now and continue home PT.    Canker Sores Canker sores are small, painful sores that develop inside your mouth. They may also be called aphthous ulcers. You can get canker sores on the inside of your lips or cheeks, on your tongue, or anywhere inside your mouth. You can have just one canker sore or several of them. Canker sores cannot be passed from one person to another (noncontagious). These sores are different than the sores that you may get on the outside of your lips (cold sores or fever blisters). Canker sores usually start as painful red bumps. Then they turn into small white, yellow, or gray ulcers that have red borders. The ulcers may be quite painful. The pain may be worse when you eat or drink. What are the causes? The cause of this condition is not known. What increases the risk? This condition is more likely to develop in:  Women.  People in their teens or 69s.  Women who are having their menstrual period.  People who are under a lot of emotional stress.  People who do not get enough iron or B vitamins.  People who have poor oral hygiene.  People who have an injury inside the mouth. This can happen after having dental work or from chewing something hard.  What are the signs or symptoms? Along with the canker sore, symptoms may also include:  Fever.  Fatigue.  Swollen lymph nodes in your neck.  How is this diagnosed? This condition can be diagnosed based on your symptoms. Your health care provider will also examine your mouth. Your health care provider may also do tests if you get canker sores often or if they are very bad. Tests may include:  Blood tests to rule out other causes of canker sores.  Taking swabs from the sore to check for infection.  Taking a  small piece of skin from the sore (biopsy) to test it for cancer.  How is this treated? Most canker sores clear up without treatment in about 10 days. Home care is usually the only treatment that you will need. Over-the-counter medicines can relieve discomfort.If you have severe canker sores, your health care provider may prescribe:  Numbing ointment to relieve pain.  Vitamins.  Steroid medicines. These may be given as: ? Oral pills. ? Mouth rinses. ? Gels.  Antibiotic mouth rinse.  Follow these instructions at home:  Apply, take, or use medicines only as directed by your health care provider. These include vitamins.  If you were prescribed an antibiotic mouth rinse, finish all of it even if you start to feel better.  Until the sores are healed: ? Do not drink coffee or citrus juices. ? Do not eat spicy or salty foods.  Use a mild, over-the-counter mouth rinse as directed by your health care provider.  Practice good oral hygiene. ? Floss your teeth every day. ? Brush your teeth with a soft brush twice each day. Contact a health care provider if:  Your symptoms do not get better after two weeks.  You also have a fever or swollen glands.  You get canker sores often.  You have a canker sore that is getting larger.  You cannot eat or drink due to your canker sores. This information is  not intended to replace advice given to you by your health care provider. Make sure you discuss any questions you have with your health care provider. Document Released: 04/15/2010 Document Revised: 05/27/2015 Document Reviewed: 11/19/2013 Elsevier Interactive Patient Education  [employer].

## 2017-03-13 DIAGNOSIS — I5032 Chronic diastolic (congestive) heart failure: Secondary | ICD-10-CM | POA: Diagnosis not present

## 2017-03-13 DIAGNOSIS — J440 Chronic obstructive pulmonary disease with acute lower respiratory infection: Secondary | ICD-10-CM | POA: Diagnosis not present

## 2017-03-13 DIAGNOSIS — N183 Chronic kidney disease, stage 3 (moderate): Secondary | ICD-10-CM | POA: Diagnosis not present

## 2017-03-13 DIAGNOSIS — J156 Pneumonia due to other aerobic Gram-negative bacteria: Secondary | ICD-10-CM | POA: Diagnosis not present

## 2017-03-13 DIAGNOSIS — L120 Bullous pemphigoid: Secondary | ICD-10-CM | POA: Diagnosis not present

## 2017-03-13 DIAGNOSIS — I480 Paroxysmal atrial fibrillation: Secondary | ICD-10-CM | POA: Diagnosis not present

## 2017-03-13 DIAGNOSIS — I2510 Atherosclerotic heart disease of native coronary artery without angina pectoris: Secondary | ICD-10-CM | POA: Diagnosis not present

## 2017-03-13 DIAGNOSIS — I130 Hypertensive heart and chronic kidney disease with heart failure and stage 1 through stage 4 chronic kidney disease, or unspecified chronic kidney disease: Secondary | ICD-10-CM | POA: Diagnosis not present

## 2017-03-13 DIAGNOSIS — R918 Other nonspecific abnormal finding of lung field: Secondary | ICD-10-CM | POA: Diagnosis not present

## 2017-03-13 DIAGNOSIS — Z7952 Long term (current) use of systemic steroids: Secondary | ICD-10-CM | POA: Diagnosis not present

## 2017-03-13 DIAGNOSIS — T501X5D Adverse effect of loop [high-ceiling] diuretics, subsequent encounter: Secondary | ICD-10-CM | POA: Diagnosis not present

## 2017-03-13 DIAGNOSIS — Z7982 Long term (current) use of aspirin: Secondary | ICD-10-CM | POA: Diagnosis not present

## 2017-03-14 DIAGNOSIS — J440 Chronic obstructive pulmonary disease with acute lower respiratory infection: Secondary | ICD-10-CM | POA: Diagnosis not present

## 2017-03-14 DIAGNOSIS — I130 Hypertensive heart and chronic kidney disease with heart failure and stage 1 through stage 4 chronic kidney disease, or unspecified chronic kidney disease: Secondary | ICD-10-CM | POA: Diagnosis not present

## 2017-03-14 DIAGNOSIS — N183 Chronic kidney disease, stage 3 (moderate): Secondary | ICD-10-CM | POA: Diagnosis not present

## 2017-03-14 DIAGNOSIS — I2510 Atherosclerotic heart disease of native coronary artery without angina pectoris: Secondary | ICD-10-CM | POA: Diagnosis not present

## 2017-03-14 DIAGNOSIS — I5032 Chronic diastolic (congestive) heart failure: Secondary | ICD-10-CM | POA: Diagnosis not present

## 2017-03-14 DIAGNOSIS — J156 Pneumonia due to other aerobic Gram-negative bacteria: Secondary | ICD-10-CM | POA: Diagnosis not present

## 2017-03-16 DIAGNOSIS — J156 Pneumonia due to other aerobic Gram-negative bacteria: Secondary | ICD-10-CM | POA: Diagnosis not present

## 2017-03-16 DIAGNOSIS — I5032 Chronic diastolic (congestive) heart failure: Secondary | ICD-10-CM | POA: Diagnosis not present

## 2017-03-16 DIAGNOSIS — J440 Chronic obstructive pulmonary disease with acute lower respiratory infection: Secondary | ICD-10-CM | POA: Diagnosis not present

## 2017-03-16 DIAGNOSIS — N183 Chronic kidney disease, stage 3 (moderate): Secondary | ICD-10-CM | POA: Diagnosis not present

## 2017-03-16 DIAGNOSIS — I2510 Atherosclerotic heart disease of native coronary artery without angina pectoris: Secondary | ICD-10-CM | POA: Diagnosis not present

## 2017-03-16 DIAGNOSIS — I130 Hypertensive heart and chronic kidney disease with heart failure and stage 1 through stage 4 chronic kidney disease, or unspecified chronic kidney disease: Secondary | ICD-10-CM | POA: Diagnosis not present

## 2017-03-19 DIAGNOSIS — J156 Pneumonia due to other aerobic Gram-negative bacteria: Secondary | ICD-10-CM | POA: Diagnosis not present

## 2017-03-19 DIAGNOSIS — I130 Hypertensive heart and chronic kidney disease with heart failure and stage 1 through stage 4 chronic kidney disease, or unspecified chronic kidney disease: Secondary | ICD-10-CM | POA: Diagnosis not present

## 2017-03-19 DIAGNOSIS — N183 Chronic kidney disease, stage 3 (moderate): Secondary | ICD-10-CM | POA: Diagnosis not present

## 2017-03-19 DIAGNOSIS — I5032 Chronic diastolic (congestive) heart failure: Secondary | ICD-10-CM | POA: Diagnosis not present

## 2017-03-19 DIAGNOSIS — I2510 Atherosclerotic heart disease of native coronary artery without angina pectoris: Secondary | ICD-10-CM | POA: Diagnosis not present

## 2017-03-19 DIAGNOSIS — J440 Chronic obstructive pulmonary disease with acute lower respiratory infection: Secondary | ICD-10-CM | POA: Diagnosis not present

## 2017-03-20 ENCOUNTER — Ambulatory Visit (INDEPENDENT_AMBULATORY_CARE_PROVIDER_SITE_OTHER): Payer: Medicare Other

## 2017-03-20 DIAGNOSIS — M160 Bilateral primary osteoarthritis of hip: Secondary | ICD-10-CM | POA: Diagnosis not present

## 2017-03-20 DIAGNOSIS — J440 Chronic obstructive pulmonary disease with acute lower respiratory infection: Secondary | ICD-10-CM | POA: Diagnosis not present

## 2017-03-20 DIAGNOSIS — I130 Hypertensive heart and chronic kidney disease with heart failure and stage 1 through stage 4 chronic kidney disease, or unspecified chronic kidney disease: Secondary | ICD-10-CM | POA: Diagnosis not present

## 2017-03-20 DIAGNOSIS — J156 Pneumonia due to other aerobic Gram-negative bacteria: Secondary | ICD-10-CM | POA: Diagnosis not present

## 2017-03-20 DIAGNOSIS — N183 Chronic kidney disease, stage 3 (moderate): Secondary | ICD-10-CM | POA: Diagnosis not present

## 2017-03-20 DIAGNOSIS — I5032 Chronic diastolic (congestive) heart failure: Secondary | ICD-10-CM | POA: Diagnosis not present

## 2017-03-20 DIAGNOSIS — I2510 Atherosclerotic heart disease of native coronary artery without angina pectoris: Secondary | ICD-10-CM | POA: Diagnosis not present

## 2017-03-20 DIAGNOSIS — G8929 Other chronic pain: Secondary | ICD-10-CM

## 2017-03-20 DIAGNOSIS — M25551 Pain in right hip: Secondary | ICD-10-CM

## 2017-03-21 ENCOUNTER — Other Ambulatory Visit: Payer: Self-pay | Admitting: Family Medicine

## 2017-03-21 DIAGNOSIS — R918 Other nonspecific abnormal finding of lung field: Secondary | ICD-10-CM | POA: Diagnosis not present

## 2017-03-21 DIAGNOSIS — Z7952 Long term (current) use of systemic steroids: Secondary | ICD-10-CM | POA: Diagnosis not present

## 2017-03-21 DIAGNOSIS — F3341 Major depressive disorder, recurrent, in partial remission: Secondary | ICD-10-CM

## 2017-03-21 DIAGNOSIS — I252 Old myocardial infarction: Secondary | ICD-10-CM | POA: Diagnosis not present

## 2017-03-21 DIAGNOSIS — Z5181 Encounter for therapeutic drug level monitoring: Secondary | ICD-10-CM | POA: Diagnosis not present

## 2017-03-21 DIAGNOSIS — Z79899 Other long term (current) drug therapy: Secondary | ICD-10-CM | POA: Diagnosis not present

## 2017-03-21 DIAGNOSIS — R590 Localized enlarged lymph nodes: Secondary | ICD-10-CM | POA: Diagnosis not present

## 2017-03-21 DIAGNOSIS — R05 Cough: Secondary | ICD-10-CM | POA: Diagnosis not present

## 2017-03-21 DIAGNOSIS — I10 Essential (primary) hypertension: Secondary | ICD-10-CM | POA: Diagnosis not present

## 2017-03-21 DIAGNOSIS — I2510 Atherosclerotic heart disease of native coronary artery without angina pectoris: Secondary | ICD-10-CM | POA: Diagnosis not present

## 2017-03-21 DIAGNOSIS — L120 Bullous pemphigoid: Secondary | ICD-10-CM | POA: Diagnosis not present

## 2017-03-21 LAB — COMPLETE METABOLIC PANEL WITH GFR
AG Ratio: 1.3 (calc) (ref 1.0–2.5)
ALBUMIN MSPROF: 3.5 g/dL — AB (ref 3.6–5.1)
ALT: 40 U/L (ref 9–46)
AST: 22 U/L (ref 10–35)
Alkaline phosphatase (APISO): 88 U/L (ref 40–115)
BUN / CREAT RATIO: 24 (calc) — AB (ref 6–22)
BUN: 27 mg/dL — ABNORMAL HIGH (ref 7–25)
CALCIUM: 9 mg/dL (ref 8.6–10.3)
CO2: 29 mmol/L (ref 20–32)
CREATININE: 1.14 mg/dL — AB (ref 0.70–1.11)
Chloride: 101 mmol/L (ref 98–110)
GFR, EST AFRICAN AMERICAN: 69 mL/min/{1.73_m2} (ref >=60)
GFR, Est Non African American: 60 mL/min/{1.73_m2} (ref >=60)
GLOBULIN: 2.7 g/dL (ref 1.9–3.7)
Glucose, Bld: 259 mg/dL — ABNORMAL HIGH (ref 65–99)
Potassium: 4.2 mmol/L (ref 3.5–5.3)
SODIUM: 142 mmol/L (ref 135–146)
TOTAL PROTEIN: 6.2 g/dL (ref 6.1–8.1)
Total Bilirubin: 0.6 mg/dL (ref 0.2–1.2)

## 2017-03-22 DIAGNOSIS — I5032 Chronic diastolic (congestive) heart failure: Secondary | ICD-10-CM | POA: Diagnosis not present

## 2017-03-22 DIAGNOSIS — I252 Old myocardial infarction: Secondary | ICD-10-CM | POA: Diagnosis not present

## 2017-03-22 DIAGNOSIS — I10 Essential (primary) hypertension: Secondary | ICD-10-CM | POA: Diagnosis not present

## 2017-03-22 DIAGNOSIS — I480 Paroxysmal atrial fibrillation: Secondary | ICD-10-CM | POA: Diagnosis not present

## 2017-03-22 DIAGNOSIS — J440 Chronic obstructive pulmonary disease with acute lower respiratory infection: Secondary | ICD-10-CM | POA: Diagnosis not present

## 2017-03-22 DIAGNOSIS — N183 Chronic kidney disease, stage 3 (moderate): Secondary | ICD-10-CM | POA: Diagnosis not present

## 2017-03-22 DIAGNOSIS — J156 Pneumonia due to other aerobic Gram-negative bacteria: Secondary | ICD-10-CM | POA: Diagnosis not present

## 2017-03-22 DIAGNOSIS — I130 Hypertensive heart and chronic kidney disease with heart failure and stage 1 through stage 4 chronic kidney disease, or unspecified chronic kidney disease: Secondary | ICD-10-CM | POA: Diagnosis not present

## 2017-03-22 DIAGNOSIS — I2510 Atherosclerotic heart disease of native coronary artery without angina pectoris: Secondary | ICD-10-CM | POA: Diagnosis not present

## 2017-03-22 DIAGNOSIS — Z955 Presence of coronary angioplasty implant and graft: Secondary | ICD-10-CM | POA: Diagnosis not present

## 2017-03-23 DIAGNOSIS — I130 Hypertensive heart and chronic kidney disease with heart failure and stage 1 through stage 4 chronic kidney disease, or unspecified chronic kidney disease: Secondary | ICD-10-CM | POA: Diagnosis not present

## 2017-03-23 DIAGNOSIS — I2510 Atherosclerotic heart disease of native coronary artery without angina pectoris: Secondary | ICD-10-CM | POA: Diagnosis not present

## 2017-03-23 DIAGNOSIS — I5032 Chronic diastolic (congestive) heart failure: Secondary | ICD-10-CM | POA: Diagnosis not present

## 2017-03-23 DIAGNOSIS — J156 Pneumonia due to other aerobic Gram-negative bacteria: Secondary | ICD-10-CM | POA: Diagnosis not present

## 2017-03-23 DIAGNOSIS — N183 Chronic kidney disease, stage 3 (moderate): Secondary | ICD-10-CM | POA: Diagnosis not present

## 2017-03-23 DIAGNOSIS — J440 Chronic obstructive pulmonary disease with acute lower respiratory infection: Secondary | ICD-10-CM | POA: Diagnosis not present

## 2017-03-27 DIAGNOSIS — J440 Chronic obstructive pulmonary disease with acute lower respiratory infection: Secondary | ICD-10-CM | POA: Diagnosis not present

## 2017-03-27 DIAGNOSIS — I130 Hypertensive heart and chronic kidney disease with heart failure and stage 1 through stage 4 chronic kidney disease, or unspecified chronic kidney disease: Secondary | ICD-10-CM | POA: Diagnosis not present

## 2017-03-27 DIAGNOSIS — I2510 Atherosclerotic heart disease of native coronary artery without angina pectoris: Secondary | ICD-10-CM | POA: Diagnosis not present

## 2017-03-27 DIAGNOSIS — J156 Pneumonia due to other aerobic Gram-negative bacteria: Secondary | ICD-10-CM | POA: Diagnosis not present

## 2017-03-27 DIAGNOSIS — N183 Chronic kidney disease, stage 3 (moderate): Secondary | ICD-10-CM | POA: Diagnosis not present

## 2017-03-27 DIAGNOSIS — I5032 Chronic diastolic (congestive) heart failure: Secondary | ICD-10-CM | POA: Diagnosis not present

## 2017-03-29 DIAGNOSIS — I2510 Atherosclerotic heart disease of native coronary artery without angina pectoris: Secondary | ICD-10-CM | POA: Diagnosis not present

## 2017-03-29 DIAGNOSIS — I130 Hypertensive heart and chronic kidney disease with heart failure and stage 1 through stage 4 chronic kidney disease, or unspecified chronic kidney disease: Secondary | ICD-10-CM | POA: Diagnosis not present

## 2017-03-29 DIAGNOSIS — I5032 Chronic diastolic (congestive) heart failure: Secondary | ICD-10-CM | POA: Diagnosis not present

## 2017-03-29 DIAGNOSIS — N183 Chronic kidney disease, stage 3 (moderate): Secondary | ICD-10-CM | POA: Diagnosis not present

## 2017-03-29 DIAGNOSIS — J156 Pneumonia due to other aerobic Gram-negative bacteria: Secondary | ICD-10-CM | POA: Diagnosis not present

## 2017-03-29 DIAGNOSIS — J440 Chronic obstructive pulmonary disease with acute lower respiratory infection: Secondary | ICD-10-CM | POA: Diagnosis not present

## 2017-03-30 DIAGNOSIS — J156 Pneumonia due to other aerobic Gram-negative bacteria: Secondary | ICD-10-CM | POA: Diagnosis not present

## 2017-03-30 DIAGNOSIS — J440 Chronic obstructive pulmonary disease with acute lower respiratory infection: Secondary | ICD-10-CM | POA: Diagnosis not present

## 2017-03-30 DIAGNOSIS — N183 Chronic kidney disease, stage 3 (moderate): Secondary | ICD-10-CM | POA: Diagnosis not present

## 2017-03-30 DIAGNOSIS — I2510 Atherosclerotic heart disease of native coronary artery without angina pectoris: Secondary | ICD-10-CM | POA: Diagnosis not present

## 2017-03-30 DIAGNOSIS — I130 Hypertensive heart and chronic kidney disease with heart failure and stage 1 through stage 4 chronic kidney disease, or unspecified chronic kidney disease: Secondary | ICD-10-CM | POA: Diagnosis not present

## 2017-03-30 DIAGNOSIS — I5032 Chronic diastolic (congestive) heart failure: Secondary | ICD-10-CM | POA: Diagnosis not present

## 2017-04-02 DIAGNOSIS — I5032 Chronic diastolic (congestive) heart failure: Secondary | ICD-10-CM | POA: Diagnosis not present

## 2017-04-02 DIAGNOSIS — I2510 Atherosclerotic heart disease of native coronary artery without angina pectoris: Secondary | ICD-10-CM | POA: Diagnosis not present

## 2017-04-02 DIAGNOSIS — I130 Hypertensive heart and chronic kidney disease with heart failure and stage 1 through stage 4 chronic kidney disease, or unspecified chronic kidney disease: Secondary | ICD-10-CM | POA: Diagnosis not present

## 2017-04-02 DIAGNOSIS — J440 Chronic obstructive pulmonary disease with acute lower respiratory infection: Secondary | ICD-10-CM | POA: Diagnosis not present

## 2017-04-02 DIAGNOSIS — J156 Pneumonia due to other aerobic Gram-negative bacteria: Secondary | ICD-10-CM | POA: Diagnosis not present

## 2017-04-02 DIAGNOSIS — N183 Chronic kidney disease, stage 3 (moderate): Secondary | ICD-10-CM | POA: Diagnosis not present

## 2017-04-03 DIAGNOSIS — J156 Pneumonia due to other aerobic Gram-negative bacteria: Secondary | ICD-10-CM | POA: Diagnosis not present

## 2017-04-03 DIAGNOSIS — I130 Hypertensive heart and chronic kidney disease with heart failure and stage 1 through stage 4 chronic kidney disease, or unspecified chronic kidney disease: Secondary | ICD-10-CM | POA: Diagnosis not present

## 2017-04-03 DIAGNOSIS — N183 Chronic kidney disease, stage 3 (moderate): Secondary | ICD-10-CM | POA: Diagnosis not present

## 2017-04-03 DIAGNOSIS — J440 Chronic obstructive pulmonary disease with acute lower respiratory infection: Secondary | ICD-10-CM | POA: Diagnosis not present

## 2017-04-03 DIAGNOSIS — I2510 Atherosclerotic heart disease of native coronary artery without angina pectoris: Secondary | ICD-10-CM | POA: Diagnosis not present

## 2017-04-03 DIAGNOSIS — I5032 Chronic diastolic (congestive) heart failure: Secondary | ICD-10-CM | POA: Diagnosis not present

## 2017-04-04 DIAGNOSIS — I2510 Atherosclerotic heart disease of native coronary artery without angina pectoris: Secondary | ICD-10-CM | POA: Diagnosis not present

## 2017-04-04 DIAGNOSIS — N183 Chronic kidney disease, stage 3 (moderate): Secondary | ICD-10-CM | POA: Diagnosis not present

## 2017-04-04 DIAGNOSIS — J440 Chronic obstructive pulmonary disease with acute lower respiratory infection: Secondary | ICD-10-CM | POA: Diagnosis not present

## 2017-04-04 DIAGNOSIS — J156 Pneumonia due to other aerobic Gram-negative bacteria: Secondary | ICD-10-CM | POA: Diagnosis not present

## 2017-04-04 DIAGNOSIS — I5032 Chronic diastolic (congestive) heart failure: Secondary | ICD-10-CM | POA: Diagnosis not present

## 2017-04-04 DIAGNOSIS — I130 Hypertensive heart and chronic kidney disease with heart failure and stage 1 through stage 4 chronic kidney disease, or unspecified chronic kidney disease: Secondary | ICD-10-CM | POA: Diagnosis not present

## 2017-04-05 ENCOUNTER — Telehealth: Payer: Self-pay

## 2017-04-05 DIAGNOSIS — I130 Hypertensive heart and chronic kidney disease with heart failure and stage 1 through stage 4 chronic kidney disease, or unspecified chronic kidney disease: Secondary | ICD-10-CM | POA: Diagnosis not present

## 2017-04-05 DIAGNOSIS — J156 Pneumonia due to other aerobic Gram-negative bacteria: Secondary | ICD-10-CM | POA: Diagnosis not present

## 2017-04-05 DIAGNOSIS — J440 Chronic obstructive pulmonary disease with acute lower respiratory infection: Secondary | ICD-10-CM | POA: Diagnosis not present

## 2017-04-05 DIAGNOSIS — N183 Chronic kidney disease, stage 3 (moderate): Secondary | ICD-10-CM | POA: Diagnosis not present

## 2017-04-05 DIAGNOSIS — I2510 Atherosclerotic heart disease of native coronary artery without angina pectoris: Secondary | ICD-10-CM | POA: Diagnosis not present

## 2017-04-05 DIAGNOSIS — I5032 Chronic diastolic (congestive) heart failure: Secondary | ICD-10-CM | POA: Diagnosis not present

## 2017-04-06 DIAGNOSIS — I2510 Atherosclerotic heart disease of native coronary artery without angina pectoris: Secondary | ICD-10-CM | POA: Diagnosis present

## 2017-04-06 DIAGNOSIS — R1313 Dysphagia, pharyngeal phase: Secondary | ICD-10-CM | POA: Diagnosis present

## 2017-04-06 DIAGNOSIS — I517 Cardiomegaly: Secondary | ICD-10-CM | POA: Diagnosis not present

## 2017-04-06 DIAGNOSIS — Z8701 Personal history of pneumonia (recurrent): Secondary | ICD-10-CM | POA: Diagnosis not present

## 2017-04-06 DIAGNOSIS — R404 Transient alteration of awareness: Secondary | ICD-10-CM | POA: Diagnosis not present

## 2017-04-06 DIAGNOSIS — I059 Rheumatic mitral valve disease, unspecified: Secondary | ICD-10-CM | POA: Diagnosis not present

## 2017-04-06 DIAGNOSIS — D899 Disorder involving the immune mechanism, unspecified: Secondary | ICD-10-CM | POA: Diagnosis not present

## 2017-04-06 DIAGNOSIS — Z79899 Other long term (current) drug therapy: Secondary | ICD-10-CM | POA: Diagnosis not present

## 2017-04-06 DIAGNOSIS — I10 Essential (primary) hypertension: Secondary | ICD-10-CM | POA: Diagnosis not present

## 2017-04-06 DIAGNOSIS — G3184 Mild cognitive impairment, so stated: Secondary | ICD-10-CM | POA: Diagnosis present

## 2017-04-06 DIAGNOSIS — I5032 Chronic diastolic (congestive) heart failure: Secondary | ICD-10-CM | POA: Diagnosis not present

## 2017-04-06 DIAGNOSIS — Z888 Allergy status to other drugs, medicaments and biological substances status: Secondary | ICD-10-CM | POA: Diagnosis not present

## 2017-04-06 DIAGNOSIS — R7881 Bacteremia: Secondary | ICD-10-CM | POA: Diagnosis not present

## 2017-04-06 DIAGNOSIS — R6510 Systemic inflammatory response syndrome (SIRS) of non-infectious origin without acute organ dysfunction: Secondary | ICD-10-CM | POA: Diagnosis not present

## 2017-04-06 DIAGNOSIS — J189 Pneumonia, unspecified organism: Secondary | ICD-10-CM | POA: Diagnosis not present

## 2017-04-06 DIAGNOSIS — J690 Pneumonitis due to inhalation of food and vomit: Secondary | ICD-10-CM | POA: Diagnosis present

## 2017-04-06 DIAGNOSIS — I519 Heart disease, unspecified: Secondary | ICD-10-CM | POA: Diagnosis not present

## 2017-04-06 DIAGNOSIS — R531 Weakness: Secondary | ICD-10-CM | POA: Diagnosis not present

## 2017-04-06 DIAGNOSIS — R0602 Shortness of breath: Secondary | ICD-10-CM | POA: Diagnosis not present

## 2017-04-06 DIAGNOSIS — Z951 Presence of aortocoronary bypass graft: Secondary | ICD-10-CM | POA: Diagnosis not present

## 2017-04-06 DIAGNOSIS — N183 Chronic kidney disease, stage 3 (moderate): Secondary | ICD-10-CM | POA: Diagnosis not present

## 2017-04-06 DIAGNOSIS — Z7952 Long term (current) use of systemic steroids: Secondary | ICD-10-CM | POA: Diagnosis not present

## 2017-04-06 DIAGNOSIS — R05 Cough: Secondary | ICD-10-CM | POA: Diagnosis not present

## 2017-04-06 DIAGNOSIS — A419 Sepsis, unspecified organism: Secondary | ICD-10-CM | POA: Diagnosis not present

## 2017-04-06 DIAGNOSIS — I482 Chronic atrial fibrillation: Secondary | ICD-10-CM | POA: Diagnosis not present

## 2017-04-06 DIAGNOSIS — I252 Old myocardial infarction: Secondary | ICD-10-CM | POA: Diagnosis not present

## 2017-04-06 DIAGNOSIS — R590 Localized enlarged lymph nodes: Secondary | ICD-10-CM | POA: Diagnosis not present

## 2017-04-06 DIAGNOSIS — I480 Paroxysmal atrial fibrillation: Secondary | ICD-10-CM | POA: Diagnosis not present

## 2017-04-06 DIAGNOSIS — I130 Hypertensive heart and chronic kidney disease with heart failure and stage 1 through stage 4 chronic kidney disease, or unspecified chronic kidney disease: Secondary | ICD-10-CM | POA: Diagnosis not present

## 2017-04-06 DIAGNOSIS — R918 Other nonspecific abnormal finding of lung field: Secondary | ICD-10-CM | POA: Diagnosis not present

## 2017-04-06 DIAGNOSIS — I4891 Unspecified atrial fibrillation: Secondary | ICD-10-CM | POA: Diagnosis not present

## 2017-04-06 DIAGNOSIS — Z8673 Personal history of transient ischemic attack (TIA), and cerebral infarction without residual deficits: Secondary | ICD-10-CM | POA: Diagnosis not present

## 2017-04-06 DIAGNOSIS — L120 Bullous pemphigoid: Secondary | ICD-10-CM | POA: Diagnosis not present

## 2017-04-06 DIAGNOSIS — Z66 Do not resuscitate: Secondary | ICD-10-CM | POA: Diagnosis present

## 2017-04-06 DIAGNOSIS — R509 Fever, unspecified: Secondary | ICD-10-CM | POA: Diagnosis not present

## 2017-04-06 DIAGNOSIS — R1310 Dysphagia, unspecified: Secondary | ICD-10-CM | POA: Diagnosis not present

## 2017-04-08 ENCOUNTER — Other Ambulatory Visit: Payer: Self-pay | Admitting: Family Medicine

## 2017-04-08 DIAGNOSIS — I2510 Atherosclerotic heart disease of native coronary artery without angina pectoris: Secondary | ICD-10-CM

## 2017-04-08 DIAGNOSIS — I10 Essential (primary) hypertension: Secondary | ICD-10-CM

## 2017-04-10 ENCOUNTER — Encounter: Payer: Self-pay | Admitting: Family Medicine

## 2017-04-12 ENCOUNTER — Ambulatory Visit: Payer: Self-pay | Admitting: Family Medicine

## 2017-04-12 MED ORDER — METHYLPREDNISOLONE SODIUM SUCC 40 MG IJ SOLR
20.00 | INTRAMUSCULAR | Status: DC
Start: 2017-04-13 — End: 2017-04-12

## 2017-04-12 MED ORDER — RIVAROXABAN 10 MG PO TABS
20.00 | ORAL_TABLET | ORAL | Status: DC
Start: 2017-04-13 — End: 2017-04-12

## 2017-04-12 MED ORDER — SODIUM CHLORIDE 0.9 % IV SOLN
INTRAVENOUS | Status: DC
Start: ? — End: 2017-04-12

## 2017-04-12 MED ORDER — ONDANSETRON HCL 4 MG/2ML IJ SOLN
4.00 | INTRAMUSCULAR | Status: DC
Start: ? — End: 2017-04-12

## 2017-04-12 MED ORDER — SULFAMETHOXAZOLE-TRIMETHOPRIM 800-160 MG PO TABS
ORAL_TABLET | ORAL | Status: DC
Start: 2017-04-13 — End: 2017-04-12

## 2017-04-12 MED ORDER — SERTRALINE HCL 100 MG PO TABS
100.00 | ORAL_TABLET | ORAL | Status: DC
Start: 2017-04-13 — End: 2017-04-12

## 2017-04-12 MED ORDER — IPRATROPIUM-ALBUTEROL 0.5-2.5 (3) MG/3ML IN SOLN
3.00 | RESPIRATORY_TRACT | Status: DC
Start: 2017-04-12 — End: 2017-04-12

## 2017-04-12 MED ORDER — ASPIRIN EC 81 MG PO TBEC
81.00 | DELAYED_RELEASE_TABLET | ORAL | Status: DC
Start: 2017-04-13 — End: 2017-04-12

## 2017-04-12 MED ORDER — ATORVASTATIN CALCIUM 40 MG PO TABS
40.00 | ORAL_TABLET | ORAL | Status: DC
Start: 2017-04-12 — End: 2017-04-12

## 2017-04-12 MED ORDER — GENERIC EXTERNAL MEDICATION
3.38 | Status: DC
Start: 2017-04-12 — End: 2017-04-12

## 2017-04-12 MED ORDER — ALBUTEROL SULFATE (2.5 MG/3ML) 0.083% IN NEBU
2.50 | INHALATION_SOLUTION | RESPIRATORY_TRACT | Status: DC
Start: ? — End: 2017-04-12

## 2017-04-12 MED ORDER — SENNA-DOCUSATE SODIUM 8.6-50 MG PO TABS
ORAL_TABLET | ORAL | Status: DC
Start: ? — End: 2017-04-12

## 2017-04-12 MED ORDER — DILTIAZEM HCL ER COATED BEADS 120 MG PO CP24
120.00 | ORAL_CAPSULE | ORAL | Status: DC
Start: 2017-04-13 — End: 2017-04-12

## 2017-04-12 MED ORDER — NITROGLYCERIN 0.4 MG SL SUBL
.40 | SUBLINGUAL_TABLET | SUBLINGUAL | Status: DC
Start: ? — End: 2017-04-12

## 2017-04-12 MED ORDER — TORSEMIDE 20 MG PO TABS
20.00 | ORAL_TABLET | ORAL | Status: DC
Start: 2017-04-13 — End: 2017-04-12

## 2017-04-12 MED ORDER — HYDRALAZINE HCL 20 MG/ML IJ SOLN
10.00 | INTRAMUSCULAR | Status: DC
Start: ? — End: 2017-04-12

## 2017-04-12 MED ORDER — CLOTRIMAZOLE 1 % EX CREA
TOPICAL_CREAM | CUTANEOUS | Status: DC
Start: 2017-04-13 — End: 2017-04-12

## 2017-04-12 MED ORDER — GENERIC EXTERNAL MEDICATION
Status: DC
Start: ? — End: 2017-04-12

## 2017-04-12 MED ORDER — DOXAZOSIN MESYLATE 4 MG PO TABS
4.00 | ORAL_TABLET | ORAL | Status: DC
Start: 2017-04-12 — End: 2017-04-12

## 2017-04-12 MED ORDER — ACETAMINOPHEN 325 MG PO TABS
650.00 | ORAL_TABLET | ORAL | Status: DC
Start: ? — End: 2017-04-12

## 2017-04-12 MED ORDER — MYCOPHENOLATE MOFETIL 500 MG PO TABS
1000.00 | ORAL_TABLET | ORAL | Status: DC
Start: 2017-04-12 — End: 2017-04-12

## 2017-04-14 DIAGNOSIS — R0603 Acute respiratory distress: Secondary | ICD-10-CM | POA: Diagnosis not present

## 2017-04-14 DIAGNOSIS — A419 Sepsis, unspecified organism: Secondary | ICD-10-CM | POA: Diagnosis not present

## 2017-04-14 DIAGNOSIS — R339 Retention of urine, unspecified: Secondary | ICD-10-CM | POA: Diagnosis not present

## 2017-04-14 DIAGNOSIS — J9601 Acute respiratory failure with hypoxia: Secondary | ICD-10-CM | POA: Diagnosis not present

## 2017-04-14 DIAGNOSIS — F05 Delirium due to known physiological condition: Secondary | ICD-10-CM | POA: Diagnosis not present

## 2017-04-14 DIAGNOSIS — G4733 Obstructive sleep apnea (adult) (pediatric): Secondary | ICD-10-CM | POA: Diagnosis present

## 2017-04-14 DIAGNOSIS — R0602 Shortness of breath: Secondary | ICD-10-CM | POA: Diagnosis not present

## 2017-04-14 DIAGNOSIS — I2510 Atherosclerotic heart disease of native coronary artery without angina pectoris: Secondary | ICD-10-CM | POA: Diagnosis not present

## 2017-04-14 DIAGNOSIS — R310 Gross hematuria: Secondary | ICD-10-CM | POA: Diagnosis not present

## 2017-04-14 DIAGNOSIS — J90 Pleural effusion, not elsewhere classified: Secondary | ICD-10-CM | POA: Diagnosis not present

## 2017-04-14 DIAGNOSIS — R140 Abdominal distension (gaseous): Secondary | ICD-10-CM | POA: Diagnosis not present

## 2017-04-14 DIAGNOSIS — L120 Bullous pemphigoid: Secondary | ICD-10-CM | POA: Diagnosis not present

## 2017-04-14 DIAGNOSIS — R402411 Glasgow coma scale score 13-15, in the field [EMT or ambulance]: Secondary | ICD-10-CM | POA: Diagnosis not present

## 2017-04-14 DIAGNOSIS — N401 Enlarged prostate with lower urinary tract symptoms: Secondary | ICD-10-CM | POA: Diagnosis present

## 2017-04-14 DIAGNOSIS — I5032 Chronic diastolic (congestive) heart failure: Secondary | ICD-10-CM | POA: Diagnosis not present

## 2017-04-14 DIAGNOSIS — D849 Immunodeficiency, unspecified: Secondary | ICD-10-CM | POA: Diagnosis not present

## 2017-04-14 DIAGNOSIS — K6389 Other specified diseases of intestine: Secondary | ICD-10-CM | POA: Diagnosis not present

## 2017-04-14 DIAGNOSIS — I480 Paroxysmal atrial fibrillation: Secondary | ICD-10-CM | POA: Diagnosis not present

## 2017-04-14 DIAGNOSIS — G9340 Encephalopathy, unspecified: Secondary | ICD-10-CM | POA: Diagnosis not present

## 2017-04-14 DIAGNOSIS — I252 Old myocardial infarction: Secondary | ICD-10-CM | POA: Diagnosis not present

## 2017-04-14 DIAGNOSIS — R531 Weakness: Secondary | ICD-10-CM | POA: Diagnosis not present

## 2017-04-14 DIAGNOSIS — N400 Enlarged prostate without lower urinary tract symptoms: Secondary | ICD-10-CM | POA: Diagnosis not present

## 2017-04-14 DIAGNOSIS — I4891 Unspecified atrial fibrillation: Secondary | ICD-10-CM | POA: Diagnosis not present

## 2017-04-14 DIAGNOSIS — R1310 Dysphagia, unspecified: Secondary | ICD-10-CM | POA: Diagnosis not present

## 2017-04-14 DIAGNOSIS — I69359 Hemiplegia and hemiparesis following cerebral infarction affecting unspecified side: Secondary | ICD-10-CM | POA: Diagnosis not present

## 2017-04-14 DIAGNOSIS — Z982 Presence of cerebrospinal fluid drainage device: Secondary | ICD-10-CM | POA: Diagnosis not present

## 2017-04-14 DIAGNOSIS — K3189 Other diseases of stomach and duodenum: Secondary | ICD-10-CM | POA: Diagnosis not present

## 2017-04-14 DIAGNOSIS — R1313 Dysphagia, pharyngeal phase: Secondary | ICD-10-CM | POA: Diagnosis not present

## 2017-04-14 DIAGNOSIS — F418 Other specified anxiety disorders: Secondary | ICD-10-CM | POA: Diagnosis present

## 2017-04-14 DIAGNOSIS — R4182 Altered mental status, unspecified: Secondary | ICD-10-CM | POA: Diagnosis not present

## 2017-04-14 DIAGNOSIS — I517 Cardiomegaly: Secondary | ICD-10-CM | POA: Diagnosis not present

## 2017-04-14 DIAGNOSIS — Z7952 Long term (current) use of systemic steroids: Secondary | ICD-10-CM | POA: Diagnosis not present

## 2017-04-14 DIAGNOSIS — F419 Anxiety disorder, unspecified: Secondary | ICD-10-CM | POA: Diagnosis not present

## 2017-04-14 DIAGNOSIS — Z452 Encounter for adjustment and management of vascular access device: Secondary | ICD-10-CM | POA: Diagnosis not present

## 2017-04-14 DIAGNOSIS — R5381 Other malaise: Secondary | ICD-10-CM | POA: Diagnosis not present

## 2017-04-14 DIAGNOSIS — R05 Cough: Secondary | ICD-10-CM | POA: Diagnosis not present

## 2017-04-14 DIAGNOSIS — Z66 Do not resuscitate: Secondary | ICD-10-CM | POA: Diagnosis not present

## 2017-04-14 DIAGNOSIS — J690 Pneumonitis due to inhalation of food and vomit: Secondary | ICD-10-CM | POA: Diagnosis not present

## 2017-04-14 DIAGNOSIS — R410 Disorientation, unspecified: Secondary | ICD-10-CM | POA: Diagnosis not present

## 2017-04-14 DIAGNOSIS — Z7189 Other specified counseling: Secondary | ICD-10-CM | POA: Diagnosis not present

## 2017-04-14 DIAGNOSIS — G9341 Metabolic encephalopathy: Secondary | ICD-10-CM | POA: Diagnosis present

## 2017-04-14 DIAGNOSIS — I130 Hypertensive heart and chronic kidney disease with heart failure and stage 1 through stage 4 chronic kidney disease, or unspecified chronic kidney disease: Secondary | ICD-10-CM | POA: Diagnosis present

## 2017-04-14 DIAGNOSIS — Z515 Encounter for palliative care: Secondary | ICD-10-CM | POA: Diagnosis not present

## 2017-04-14 DIAGNOSIS — F329 Major depressive disorder, single episode, unspecified: Secondary | ICD-10-CM | POA: Diagnosis not present

## 2017-04-14 DIAGNOSIS — B370 Candidal stomatitis: Secondary | ICD-10-CM | POA: Diagnosis not present

## 2017-04-14 DIAGNOSIS — E440 Moderate protein-calorie malnutrition: Secondary | ICD-10-CM | POA: Diagnosis not present

## 2017-04-14 DIAGNOSIS — B004 Herpesviral encephalitis: Secondary | ICD-10-CM | POA: Diagnosis not present

## 2017-04-14 DIAGNOSIS — I10 Essential (primary) hypertension: Secondary | ICD-10-CM | POA: Diagnosis not present

## 2017-04-14 DIAGNOSIS — R918 Other nonspecific abnormal finding of lung field: Secondary | ICD-10-CM | POA: Diagnosis not present

## 2017-04-14 DIAGNOSIS — J449 Chronic obstructive pulmonary disease, unspecified: Secondary | ICD-10-CM | POA: Diagnosis present

## 2017-04-14 DIAGNOSIS — J189 Pneumonia, unspecified organism: Secondary | ICD-10-CM | POA: Diagnosis not present

## 2017-04-14 DIAGNOSIS — R972 Elevated prostate specific antigen [PSA]: Secondary | ICD-10-CM | POA: Diagnosis not present

## 2017-04-14 DIAGNOSIS — J811 Chronic pulmonary edema: Secondary | ICD-10-CM | POA: Diagnosis not present

## 2017-04-14 DIAGNOSIS — J440 Chronic obstructive pulmonary disease with acute lower respiratory infection: Secondary | ICD-10-CM | POA: Diagnosis not present

## 2017-04-14 DIAGNOSIS — E785 Hyperlipidemia, unspecified: Secondary | ICD-10-CM | POA: Diagnosis present

## 2017-04-14 DIAGNOSIS — R338 Other retention of urine: Secondary | ICD-10-CM | POA: Diagnosis present

## 2017-04-14 DIAGNOSIS — Z9225 Personal history of immunosupression therapy: Secondary | ICD-10-CM | POA: Diagnosis not present

## 2017-04-14 DIAGNOSIS — J156 Pneumonia due to other aerobic Gram-negative bacteria: Secondary | ICD-10-CM | POA: Diagnosis not present

## 2017-04-14 DIAGNOSIS — N183 Chronic kidney disease, stage 3 (moderate): Secondary | ICD-10-CM | POA: Diagnosis not present

## 2017-04-14 MED ORDER — GENERIC EXTERNAL MEDICATION
Status: DC
Start: ? — End: 2017-04-14

## 2017-04-17 ENCOUNTER — Ambulatory Visit: Payer: Self-pay | Admitting: Family Medicine

## 2017-04-26 ENCOUNTER — Inpatient Hospital Stay
Admission: AD | Admit: 2017-04-26 | Discharge: 2017-06-05 | Disposition: A | Discharge status: Skilled Nursing Facility | Payer: Self-pay | Source: Ambulatory Visit | Attending: Internal Medicine | Admitting: Internal Medicine

## 2017-04-26 DIAGNOSIS — R410 Disorientation, unspecified: Secondary | ICD-10-CM | POA: Diagnosis not present

## 2017-04-26 DIAGNOSIS — R1310 Dysphagia, unspecified: Secondary | ICD-10-CM | POA: Diagnosis not present

## 2017-04-26 DIAGNOSIS — Z7189 Other specified counseling: Secondary | ICD-10-CM | POA: Diagnosis not present

## 2017-04-26 DIAGNOSIS — J156 Pneumonia due to other aerobic Gram-negative bacteria: Secondary | ICD-10-CM | POA: Diagnosis not present

## 2017-04-26 DIAGNOSIS — R1313 Dysphagia, pharyngeal phase: Secondary | ICD-10-CM | POA: Diagnosis not present

## 2017-04-26 DIAGNOSIS — L120 Bullous pemphigoid: Secondary | ICD-10-CM | POA: Diagnosis not present

## 2017-04-26 DIAGNOSIS — K5730 Diverticulosis of large intestine without perforation or abscess without bleeding: Secondary | ICD-10-CM | POA: Diagnosis not present

## 2017-04-26 DIAGNOSIS — M6281 Muscle weakness (generalized): Secondary | ICD-10-CM | POA: Diagnosis not present

## 2017-04-26 DIAGNOSIS — E870 Hyperosmolality and hypernatremia: Secondary | ICD-10-CM | POA: Diagnosis not present

## 2017-04-26 DIAGNOSIS — N390 Urinary tract infection, site not specified: Secondary | ICD-10-CM | POA: Diagnosis not present

## 2017-04-26 DIAGNOSIS — R310 Gross hematuria: Secondary | ICD-10-CM | POA: Diagnosis not present

## 2017-04-26 DIAGNOSIS — R4182 Altered mental status, unspecified: Secondary | ICD-10-CM | POA: Diagnosis not present

## 2017-04-26 DIAGNOSIS — J690 Pneumonitis due to inhalation of food and vomit: Secondary | ICD-10-CM | POA: Diagnosis not present

## 2017-04-26 DIAGNOSIS — N400 Enlarged prostate without lower urinary tract symptoms: Secondary | ICD-10-CM | POA: Diagnosis not present

## 2017-04-26 DIAGNOSIS — E46 Unspecified protein-calorie malnutrition: Secondary | ICD-10-CM | POA: Diagnosis not present

## 2017-04-26 DIAGNOSIS — Z431 Encounter for attention to gastrostomy: Secondary | ICD-10-CM

## 2017-04-26 DIAGNOSIS — I481 Persistent atrial fibrillation: Secondary | ICD-10-CM | POA: Diagnosis not present

## 2017-04-26 DIAGNOSIS — J9690 Respiratory failure, unspecified, unspecified whether with hypoxia or hypercapnia: Secondary | ICD-10-CM | POA: Diagnosis not present

## 2017-04-26 DIAGNOSIS — R05 Cough: Secondary | ICD-10-CM | POA: Diagnosis not present

## 2017-04-26 DIAGNOSIS — G0490 Encephalitis and encephalomyelitis, unspecified: Secondary | ICD-10-CM | POA: Diagnosis not present

## 2017-04-26 DIAGNOSIS — I5032 Chronic diastolic (congestive) heart failure: Secondary | ICD-10-CM | POA: Diagnosis not present

## 2017-04-26 DIAGNOSIS — L89150 Pressure ulcer of sacral region, unstageable: Secondary | ICD-10-CM | POA: Diagnosis present

## 2017-04-26 DIAGNOSIS — F329 Major depressive disorder, single episode, unspecified: Secondary | ICD-10-CM | POA: Diagnosis not present

## 2017-04-26 DIAGNOSIS — R404 Transient alteration of awareness: Secondary | ICD-10-CM | POA: Diagnosis not present

## 2017-04-26 DIAGNOSIS — Z515 Encounter for palliative care: Secondary | ICD-10-CM | POA: Diagnosis not present

## 2017-04-26 DIAGNOSIS — R0602 Shortness of breath: Secondary | ICD-10-CM | POA: Diagnosis not present

## 2017-04-26 DIAGNOSIS — Z4682 Encounter for fitting and adjustment of non-vascular catheter: Secondary | ICD-10-CM | POA: Diagnosis not present

## 2017-04-26 DIAGNOSIS — E785 Hyperlipidemia, unspecified: Secondary | ICD-10-CM | POA: Diagnosis not present

## 2017-04-26 DIAGNOSIS — F0281 Dementia in other diseases classified elsewhere with behavioral disturbance: Secondary | ICD-10-CM | POA: Insufficient documentation

## 2017-04-26 DIAGNOSIS — I120 Hypertensive chronic kidney disease with stage 5 chronic kidney disease or end stage renal disease: Secondary | ICD-10-CM | POA: Diagnosis not present

## 2017-04-26 DIAGNOSIS — J9601 Acute respiratory failure with hypoxia: Secondary | ICD-10-CM | POA: Diagnosis present

## 2017-04-26 DIAGNOSIS — N183 Chronic kidney disease, stage 3 (moderate): Secondary | ICD-10-CM | POA: Diagnosis not present

## 2017-04-26 DIAGNOSIS — J9811 Atelectasis: Secondary | ICD-10-CM | POA: Diagnosis not present

## 2017-04-26 DIAGNOSIS — E43 Unspecified severe protein-calorie malnutrition: Secondary | ICD-10-CM | POA: Diagnosis not present

## 2017-04-26 DIAGNOSIS — R319 Hematuria, unspecified: Secondary | ICD-10-CM | POA: Diagnosis not present

## 2017-04-26 DIAGNOSIS — J189 Pneumonia, unspecified organism: Secondary | ICD-10-CM | POA: Diagnosis not present

## 2017-04-26 DIAGNOSIS — N401 Enlarged prostate with lower urinary tract symptoms: Secondary | ICD-10-CM | POA: Diagnosis not present

## 2017-04-26 DIAGNOSIS — K5900 Constipation, unspecified: Secondary | ICD-10-CM | POA: Diagnosis not present

## 2017-04-26 DIAGNOSIS — Z299 Encounter for prophylactic measures, unspecified: Secondary | ICD-10-CM | POA: Diagnosis not present

## 2017-04-26 DIAGNOSIS — I252 Old myocardial infarction: Secondary | ICD-10-CM | POA: Diagnosis not present

## 2017-04-26 DIAGNOSIS — I130 Hypertensive heart and chronic kidney disease with heart failure and stage 1 through stage 4 chronic kidney disease, or unspecified chronic kidney disease: Secondary | ICD-10-CM | POA: Diagnosis present

## 2017-04-26 DIAGNOSIS — J9600 Acute respiratory failure, unspecified whether with hypoxia or hypercapnia: Secondary | ICD-10-CM | POA: Diagnosis not present

## 2017-04-26 DIAGNOSIS — R279 Unspecified lack of coordination: Secondary | ICD-10-CM | POA: Diagnosis not present

## 2017-04-26 DIAGNOSIS — G9340 Encephalopathy, unspecified: Secondary | ICD-10-CM | POA: Diagnosis not present

## 2017-04-26 DIAGNOSIS — K279 Peptic ulcer, site unspecified, unspecified as acute or chronic, without hemorrhage or perforation: Secondary | ICD-10-CM | POA: Diagnosis not present

## 2017-04-26 DIAGNOSIS — Z66 Do not resuscitate: Secondary | ICD-10-CM | POA: Diagnosis present

## 2017-04-26 DIAGNOSIS — D6832 Hemorrhagic disorder due to extrinsic circulating anticoagulants: Secondary | ICD-10-CM | POA: Diagnosis not present

## 2017-04-26 DIAGNOSIS — A419 Sepsis, unspecified organism: Secondary | ICD-10-CM | POA: Diagnosis not present

## 2017-04-26 DIAGNOSIS — D72829 Elevated white blood cell count, unspecified: Secondary | ICD-10-CM | POA: Diagnosis not present

## 2017-04-26 DIAGNOSIS — I10 Essential (primary) hypertension: Secondary | ICD-10-CM | POA: Diagnosis not present

## 2017-04-26 DIAGNOSIS — E569 Vitamin deficiency, unspecified: Secondary | ICD-10-CM | POA: Diagnosis not present

## 2017-04-26 DIAGNOSIS — Z0189 Encounter for other specified special examinations: Secondary | ICD-10-CM

## 2017-04-26 DIAGNOSIS — D649 Anemia, unspecified: Secondary | ICD-10-CM | POA: Diagnosis present

## 2017-04-26 DIAGNOSIS — B1009 Other human herpesvirus encephalitis: Secondary | ICD-10-CM | POA: Diagnosis not present

## 2017-04-26 DIAGNOSIS — D849 Immunodeficiency, unspecified: Secondary | ICD-10-CM | POA: Diagnosis not present

## 2017-04-26 DIAGNOSIS — E0800 Diabetes mellitus due to underlying condition with hyperosmolarity without nonketotic hyperglycemic-hyperosmolar coma (NKHHC): Secondary | ICD-10-CM | POA: Diagnosis not present

## 2017-04-26 DIAGNOSIS — T45515A Adverse effect of anticoagulants, initial encounter: Secondary | ICD-10-CM | POA: Diagnosis not present

## 2017-04-26 DIAGNOSIS — A403 Sepsis due to Streptococcus pneumoniae: Secondary | ICD-10-CM | POA: Diagnosis not present

## 2017-04-26 DIAGNOSIS — I2510 Atherosclerotic heart disease of native coronary artery without angina pectoris: Secondary | ICD-10-CM | POA: Diagnosis not present

## 2017-04-26 DIAGNOSIS — I480 Paroxysmal atrial fibrillation: Secondary | ICD-10-CM | POA: Diagnosis not present

## 2017-04-26 DIAGNOSIS — F09 Unspecified mental disorder due to known physiological condition: Secondary | ICD-10-CM

## 2017-04-26 DIAGNOSIS — R918 Other nonspecific abnormal finding of lung field: Secondary | ICD-10-CM | POA: Diagnosis not present

## 2017-04-26 DIAGNOSIS — R451 Restlessness and agitation: Secondary | ICD-10-CM | POA: Diagnosis not present

## 2017-04-26 DIAGNOSIS — B004 Herpesviral encephalitis: Secondary | ICD-10-CM | POA: Diagnosis not present

## 2017-04-26 DIAGNOSIS — G4733 Obstructive sleep apnea (adult) (pediatric): Secondary | ICD-10-CM | POA: Diagnosis present

## 2017-04-26 DIAGNOSIS — J9691 Respiratory failure, unspecified with hypoxia: Secondary | ICD-10-CM | POA: Diagnosis not present

## 2017-04-26 DIAGNOSIS — Z743 Need for continuous supervision: Secondary | ICD-10-CM | POA: Diagnosis not present

## 2017-04-26 DIAGNOSIS — N179 Acute kidney failure, unspecified: Secondary | ICD-10-CM | POA: Diagnosis not present

## 2017-04-26 DIAGNOSIS — F419 Anxiety disorder, unspecified: Secondary | ICD-10-CM | POA: Diagnosis not present

## 2017-04-26 DIAGNOSIS — R972 Elevated prostate specific antigen [PSA]: Secondary | ICD-10-CM | POA: Diagnosis not present

## 2017-04-26 DIAGNOSIS — N189 Chronic kidney disease, unspecified: Secondary | ICD-10-CM | POA: Diagnosis not present

## 2017-04-26 DIAGNOSIS — F02818 Dementia in other diseases classified elsewhere, unspecified severity, with other behavioral disturbance: Secondary | ICD-10-CM | POA: Insufficient documentation

## 2017-04-26 DIAGNOSIS — G43909 Migraine, unspecified, not intractable, without status migrainosus: Secondary | ICD-10-CM | POA: Diagnosis not present

## 2017-04-26 LAB — COMPREHENSIVE METABOLIC PANEL
ALK PHOS: 73 U/L (ref 38–126)
ALT: 31 U/L (ref 17–63)
AST: 25 U/L (ref 15–41)
Albumin: 2.3 g/dL — ABNORMAL LOW (ref 3.5–5.0)
Anion gap: 9 (ref 5–15)
BUN: 15 mg/dL (ref 6–20)
CALCIUM: 8.4 mg/dL — AB (ref 8.9–10.3)
CHLORIDE: 101 mmol/L (ref 101–111)
CO2: 25 mmol/L (ref 22–32)
CREATININE: 0.82 mg/dL (ref 0.61–1.24)
GFR calc Af Amer: >60 mL/min (ref >60)
GFR calc non Af Amer: >60 mL/min (ref >60)
GLUCOSE: 222 mg/dL — AB (ref 65–99)
Potassium: 4.3 mmol/L (ref 3.5–5.1)
SODIUM: 135 mmol/L (ref 135–145)
Total Bilirubin: 0.8 mg/dL (ref 0.3–1.2)
Total Protein: 5.3 g/dL — ABNORMAL LOW (ref 6.5–8.1)

## 2017-04-26 MED ORDER — OXYBUTYNIN CHLORIDE 5 MG PO TABS
5.00 | ORAL_TABLET | ORAL | Status: DC
Start: 2017-04-26 — End: 2017-04-26

## 2017-04-26 MED ORDER — DOXAZOSIN MESYLATE 2 MG PO TABS
4.00 | ORAL_TABLET | ORAL | Status: DC
Start: 2017-04-26 — End: 2017-04-26

## 2017-04-26 MED ORDER — GENERIC EXTERNAL MEDICATION
10.00 | Status: DC
Start: 2017-04-26 — End: 2017-04-26

## 2017-04-26 MED ORDER — GENERIC EXTERNAL MEDICATION
Status: DC
Start: ? — End: 2017-04-26

## 2017-04-26 MED ORDER — ASPIRIN EC 81 MG PO TBEC
81.00 | DELAYED_RELEASE_TABLET | ORAL | Status: DC
Start: 2017-04-27 — End: 2017-04-26

## 2017-04-26 MED ORDER — CLOTRIMAZOLE 1 % EX CREA
TOPICAL_CREAM | CUTANEOUS | Status: DC
Start: 2017-04-26 — End: 2017-04-26

## 2017-04-26 MED ORDER — ALPRAZOLAM 0.25 MG PO TABS
0.25 | ORAL_TABLET | ORAL | Status: DC
Start: ? — End: 2017-04-26

## 2017-04-26 MED ORDER — MORPHINE SULFATE (PF) 4 MG/ML IV SOLN
.50 | INTRAVENOUS | Status: DC
Start: ? — End: 2017-04-26

## 2017-04-26 MED ORDER — HYDRALAZINE HCL 20 MG/ML IJ SOLN
10.00 | INTRAMUSCULAR | Status: DC
Start: ? — End: 2017-04-26

## 2017-04-26 MED ORDER — SODIUM CHLORIDE 0.9 % IV SOLN
10.00 | INTRAVENOUS | Status: DC
Start: ? — End: 2017-04-26

## 2017-04-26 MED ORDER — LORAZEPAM 2 MG/ML IJ SOLN
0.50 | INTRAMUSCULAR | Status: DC
Start: ? — End: 2017-04-26

## 2017-04-26 MED ORDER — ACETAMINOPHEN 650 MG RE SUPP
650.00 | RECTAL | Status: DC
Start: ? — End: 2017-04-26

## 2017-04-26 MED ORDER — FINASTERIDE 5 MG PO TABS
5.00 | ORAL_TABLET | ORAL | Status: DC
Start: 2017-04-27 — End: 2017-04-26

## 2017-04-26 MED ORDER — ALBUTEROL SULFATE (2.5 MG/3ML) 0.083% IN NEBU
2.50 | INHALATION_SOLUTION | RESPIRATORY_TRACT | Status: DC
Start: ? — End: 2017-04-26

## 2017-04-26 MED ORDER — DILTIAZEM HCL ER COATED BEADS 120 MG PO CP24
120.00 | ORAL_CAPSULE | ORAL | Status: DC
Start: 2017-04-27 — End: 2017-04-26

## 2017-04-26 MED ORDER — BELLADONNA ALKALOIDS-OPIUM 16.2-30 MG RE SUPP
30.00 | RECTAL | Status: DC
Start: ? — End: 2017-04-26

## 2017-04-26 MED ORDER — NOREPINEPHRINE-SODIUM CHLORIDE 8-0.9 MG/250ML-% IV SOLN
0.50 | INTRAVENOUS | Status: DC
Start: ? — End: 2017-04-26

## 2017-04-26 MED ORDER — TAMSULOSIN HCL 0.4 MG PO CAPS
0.40 | ORAL_CAPSULE | ORAL | Status: DC
Start: 2017-04-27 — End: 2017-04-26

## 2017-04-26 MED ORDER — PREDNISONE 20 MG PO TABS
40.00 | ORAL_TABLET | ORAL | Status: DC
Start: 2017-04-27 — End: 2017-04-26

## 2017-04-26 MED ORDER — BACITRACIN 500 UNIT/GM EX OINT
TOPICAL_OINTMENT | CUTANEOUS | Status: DC
Start: 2017-04-26 — End: 2017-04-26

## 2017-04-26 MED ORDER — GENERIC EXTERNAL MEDICATION
3.38 | Status: DC
Start: 2017-04-26 — End: 2017-04-26

## 2017-04-26 MED ORDER — MORPHINE SULFATE (PF) 4 MG/ML IV SOLN
1.00 | INTRAVENOUS | Status: DC
Start: ? — End: 2017-04-26

## 2017-04-26 MED ORDER — SODIUM CHLORIDE 0.9 % IV SOLN
25.00 | INTRAVENOUS | Status: DC
Start: ? — End: 2017-04-26

## 2017-04-26 MED ORDER — ATOVAQUONE 750 MG/5ML PO SUSP
1500.00 | ORAL | Status: DC
Start: 2017-04-26 — End: 2017-04-26

## 2017-04-26 MED ORDER — SENNA-DOCUSATE SODIUM 8.6-50 MG PO TABS
1.00 | ORAL_TABLET | ORAL | Status: DC
Start: ? — End: 2017-04-26

## 2017-04-26 MED ORDER — PERPHENAZINE 2 MG PO TABS
2.00 | ORAL_TABLET | ORAL | Status: DC
Start: 2017-04-26 — End: 2017-04-26

## 2017-04-26 MED ORDER — RISPERIDONE 0.5 MG PO TBDP
0.50 | ORAL_TABLET | ORAL | Status: DC
Start: ? — End: 2017-04-26

## 2017-04-26 MED ORDER — METOLAZONE 5 MG PO TABS
5.00 | ORAL_TABLET | ORAL | Status: DC
Start: 2017-04-27 — End: 2017-04-26

## 2017-04-26 MED ORDER — IPRATROPIUM-ALBUTEROL 0.5-2.5 (3) MG/3ML IN SOLN
3.00 | RESPIRATORY_TRACT | Status: DC
Start: 2017-04-26 — End: 2017-04-26

## 2017-04-26 MED ORDER — NITROGLYCERIN 0.4 MG SL SUBL
0.40 | SUBLINGUAL_TABLET | SUBLINGUAL | Status: DC
Start: ? — End: 2017-04-26

## 2017-04-26 MED ORDER — DIPHENHYDRAMINE HCL 50 MG/ML IJ SOLN
12.50 | INTRAMUSCULAR | Status: DC
Start: ? — End: 2017-04-26

## 2017-04-26 MED ORDER — ATORVASTATIN CALCIUM 40 MG PO TABS
40.00 | ORAL_TABLET | ORAL | Status: DC
Start: 2017-04-26 — End: 2017-04-26

## 2017-04-26 MED ORDER — SERTRALINE HCL 100 MG PO TABS
100.00 | ORAL_TABLET | ORAL | Status: DC
Start: 2017-04-27 — End: 2017-04-26

## 2017-04-27 ENCOUNTER — Other Ambulatory Visit (HOSPITAL_COMMUNITY): Payer: Self-pay

## 2017-04-27 DIAGNOSIS — R05 Cough: Secondary | ICD-10-CM | POA: Diagnosis not present

## 2017-04-27 DIAGNOSIS — R0602 Shortness of breath: Secondary | ICD-10-CM | POA: Diagnosis not present

## 2017-04-27 LAB — CBC WITH DIFFERENTIAL/PLATELET
BASOS ABS: 0 10*3/uL (ref 0.0–0.1)
BASOS PCT: 0 %
EOS PCT: 0 %
Eosinophils Absolute: 0 10*3/uL (ref 0.0–0.7)
HCT: 29 % — ABNORMAL LOW (ref 39.0–52.0)
Hemoglobin: 9.3 g/dL — ABNORMAL LOW (ref 13.0–17.0)
LYMPHS PCT: 7 %
Lymphs Abs: 0.5 10*3/uL — ABNORMAL LOW (ref 0.7–4.0)
MCH: 27.7 pg (ref 26.0–34.0)
MCHC: 32.1 g/dL (ref 30.0–36.0)
MCV: 86.3 fL (ref 78.0–100.0)
MONO ABS: 0.3 10*3/uL (ref 0.1–1.0)
Monocytes Relative: 4 %
NEUTROS ABS: 6.9 10*3/uL (ref 1.7–7.7)
Neutrophils Relative %: 89 %
PLATELETS: 117 10*3/uL — AB (ref 150–400)
RBC: 3.36 MIL/uL — AB (ref 4.22–5.81)
RDW: 17.5 % — AB (ref 11.5–15.5)
WBC: 7.7 10*3/uL (ref 4.0–10.5)

## 2017-04-27 LAB — PROTIME-INR
INR: 1.18
Prothrombin Time: 14.9 seconds (ref 11.4–15.2)

## 2017-04-27 LAB — BLOOD GAS, ARTERIAL
ACID-BASE EXCESS: 4.7 mmol/L — AB (ref 0.0–2.0)
BICARBONATE: 28.2 mmol/L — AB (ref 20.0–28.0)
FIO2: 0.21
O2 SAT: 93.2 %
PATIENT TEMPERATURE: 97.2
PCO2 ART: 37 mmHg (ref 32.0–48.0)
pH, Arterial: 7.49 — ABNORMAL HIGH (ref 7.350–7.450)
pO2, Arterial: 61.6 mmHg — ABNORMAL LOW (ref 83.0–108.0)

## 2017-04-27 LAB — MAGNESIUM: MAGNESIUM: 1.8 mg/dL (ref 1.7–2.4)

## 2017-04-27 LAB — PHOSPHORUS: PHOSPHORUS: 2.7 mg/dL (ref 2.5–4.6)

## 2017-04-27 MED ORDER — GENERIC EXTERNAL MEDICATION
Status: DC
Start: ? — End: 2017-04-27

## 2017-04-28 MED ORDER — GENERIC EXTERNAL MEDICATION
Status: DC
Start: ? — End: 2017-04-28

## 2017-04-30 LAB — BASIC METABOLIC PANEL
Anion gap: 11 (ref 5–15)
BUN: 22 mg/dL — AB (ref 6–20)
CHLORIDE: 92 mmol/L — AB (ref 101–111)
CO2: 34 mmol/L — ABNORMAL HIGH (ref 22–32)
Calcium: 8.6 mg/dL — ABNORMAL LOW (ref 8.9–10.3)
Creatinine, Ser: 0.98 mg/dL (ref 0.61–1.24)
GFR calc Af Amer: >60 mL/min (ref >60)
GFR calc non Af Amer: >60 mL/min (ref >60)
GLUCOSE: 151 mg/dL — AB (ref 65–99)
POTASSIUM: 3.8 mmol/L (ref 3.5–5.1)
SODIUM: 137 mmol/L (ref 135–145)

## 2017-04-30 LAB — CBC
HCT: 31.2 % — ABNORMAL LOW (ref 39.0–52.0)
HEMOGLOBIN: 9.9 g/dL — AB (ref 13.0–17.0)
MCH: 27.7 pg (ref 26.0–34.0)
MCHC: 31.7 g/dL (ref 30.0–36.0)
MCV: 87.2 fL (ref 78.0–100.0)
Platelets: 132 10*3/uL — ABNORMAL LOW (ref 150–400)
RBC: 3.58 MIL/uL — ABNORMAL LOW (ref 4.22–5.81)
RDW: 17.6 % — ABNORMAL HIGH (ref 11.5–15.5)
WBC: 8.5 10*3/uL (ref 4.0–10.5)

## 2017-04-30 LAB — MAGNESIUM: MAGNESIUM: 1.9 mg/dL (ref 1.7–2.4)

## 2017-05-02 ENCOUNTER — Other Ambulatory Visit (HOSPITAL_COMMUNITY): Payer: Self-pay

## 2017-05-02 DIAGNOSIS — R0602 Shortness of breath: Secondary | ICD-10-CM | POA: Diagnosis not present

## 2017-05-02 MED ORDER — GENERIC EXTERNAL MEDICATION
Status: DC
Start: ? — End: 2017-05-02

## 2017-05-03 ENCOUNTER — Other Ambulatory Visit (HOSPITAL_COMMUNITY): Payer: Self-pay

## 2017-05-03 DIAGNOSIS — Z4682 Encounter for fitting and adjustment of non-vascular catheter: Secondary | ICD-10-CM | POA: Diagnosis not present

## 2017-05-03 DIAGNOSIS — K5730 Diverticulosis of large intestine without perforation or abscess without bleeding: Secondary | ICD-10-CM | POA: Diagnosis not present

## 2017-05-03 LAB — PHOSPHORUS: Phosphorus: 5.8 mg/dL — ABNORMAL HIGH (ref 2.5–4.6)

## 2017-05-03 LAB — CBC
HCT: 32.7 % — ABNORMAL LOW (ref 39.0–52.0)
Hemoglobin: 10.4 g/dL — ABNORMAL LOW (ref 13.0–17.0)
MCH: 27.8 pg (ref 26.0–34.0)
MCHC: 31.8 g/dL (ref 30.0–36.0)
MCV: 87.4 fL (ref 78.0–100.0)
PLATELETS: 145 10*3/uL — AB (ref 150–400)
RBC: 3.74 MIL/uL — ABNORMAL LOW (ref 4.22–5.81)
RDW: 18.8 % — AB (ref 11.5–15.5)
WBC: 12.9 10*3/uL — ABNORMAL HIGH (ref 4.0–10.5)

## 2017-05-03 LAB — MAGNESIUM
Magnesium: 1.7 mg/dL (ref 1.7–2.4)
Magnesium: 2.1 mg/dL (ref 1.7–2.4)

## 2017-05-03 LAB — BASIC METABOLIC PANEL
Anion gap: 17 — ABNORMAL HIGH (ref 5–15)
BUN: 33 mg/dL — ABNORMAL HIGH (ref 6–20)
CALCIUM: 8.5 mg/dL — AB (ref 8.9–10.3)
CO2: 35 mmol/L — ABNORMAL HIGH (ref 22–32)
CREATININE: 1.39 mg/dL — AB (ref 0.61–1.24)
Chloride: 85 mmol/L — ABNORMAL LOW (ref 101–111)
GFR calc Af Amer: 53 mL/min — ABNORMAL LOW (ref >60)
GFR, EST NON AFRICAN AMERICAN: 46 mL/min — AB (ref >60)
Glucose, Bld: 290 mg/dL — ABNORMAL HIGH (ref 65–99)
Potassium: 3.4 mmol/L — ABNORMAL LOW (ref 3.5–5.1)
Sodium: 137 mmol/L (ref 135–145)

## 2017-05-03 LAB — POTASSIUM: Potassium: 3.7 mmol/L (ref 3.5–5.1)

## 2017-05-03 MED ORDER — GENERIC EXTERNAL MEDICATION
Status: DC
Start: ? — End: 2017-05-03

## 2017-05-04 ENCOUNTER — Other Ambulatory Visit (HOSPITAL_COMMUNITY): Payer: Self-pay

## 2017-05-04 DIAGNOSIS — R0602 Shortness of breath: Secondary | ICD-10-CM | POA: Diagnosis not present

## 2017-05-04 LAB — CBC
HCT: 26.5 % — ABNORMAL LOW (ref 39.0–52.0)
HCT: 28.3 % — ABNORMAL LOW (ref 39.0–52.0)
HEMOGLOBIN: 8.4 g/dL — AB (ref 13.0–17.0)
HEMOGLOBIN: 9 g/dL — AB (ref 13.0–17.0)
MCH: 27.6 pg (ref 26.0–34.0)
MCH: 28 pg (ref 26.0–34.0)
MCHC: 31.7 g/dL (ref 30.0–36.0)
MCHC: 31.8 g/dL (ref 30.0–36.0)
MCV: 87.2 fL (ref 78.0–100.0)
MCV: 87.9 fL (ref 78.0–100.0)
PLATELETS: 108 10*3/uL — AB (ref 150–400)
PLATELETS: 127 10*3/uL — AB (ref 150–400)
RBC: 3.04 MIL/uL — AB (ref 4.22–5.81)
RBC: 3.22 MIL/uL — AB (ref 4.22–5.81)
RDW: 18.7 % — ABNORMAL HIGH (ref 11.5–15.5)
RDW: 19 % — ABNORMAL HIGH (ref 11.5–15.5)
WBC: 3.3 10*3/uL — AB (ref 4.0–10.5)
WBC: 3.6 10*3/uL — AB (ref 4.0–10.5)

## 2017-05-04 LAB — BASIC METABOLIC PANEL
ANION GAP: 10 (ref 5–15)
BUN: 46 mg/dL — AB (ref 6–20)
CHLORIDE: 89 mmol/L — AB (ref 101–111)
CO2: 39 mmol/L — ABNORMAL HIGH (ref 22–32)
Calcium: 8 mg/dL — ABNORMAL LOW (ref 8.9–10.3)
Creatinine, Ser: 1.55 mg/dL — ABNORMAL HIGH (ref 0.61–1.24)
GFR calc Af Amer: 46 mL/min — ABNORMAL LOW (ref >60)
GFR, EST NON AFRICAN AMERICAN: 40 mL/min — AB (ref >60)
GLUCOSE: 243 mg/dL — AB (ref 65–99)
POTASSIUM: 3.4 mmol/L — AB (ref 3.5–5.1)
Sodium: 138 mmol/L (ref 135–145)

## 2017-05-04 LAB — MAGNESIUM: MAGNESIUM: 2.1 mg/dL (ref 1.7–2.4)

## 2017-05-04 LAB — PHOSPHORUS: Phosphorus: 4.6 mg/dL (ref 2.5–4.6)

## 2017-05-04 MED ORDER — GENERIC EXTERNAL MEDICATION
Status: DC
Start: ? — End: 2017-05-04

## 2017-05-05 LAB — RENAL FUNCTION PANEL
ALBUMIN: 2.2 g/dL — AB (ref 3.5–5.0)
Anion gap: 7 (ref 5–15)
BUN: 50 mg/dL — AB (ref 6–20)
CO2: 42 mmol/L — ABNORMAL HIGH (ref 22–32)
CREATININE: 1.24 mg/dL (ref 0.61–1.24)
Calcium: 8.6 mg/dL — ABNORMAL LOW (ref 8.9–10.3)
Chloride: 90 mmol/L — ABNORMAL LOW (ref 101–111)
GFR calc Af Amer: >60 mL/min (ref >60)
GFR, EST NON AFRICAN AMERICAN: 52 mL/min — AB (ref >60)
Glucose, Bld: 175 mg/dL — ABNORMAL HIGH (ref 65–99)
Phosphorus: 3 mg/dL (ref 2.5–4.6)
Potassium: 3.6 mmol/L (ref 3.5–5.1)
Sodium: 139 mmol/L (ref 135–145)

## 2017-05-05 LAB — CBC
HCT: 26.2 % — ABNORMAL LOW (ref 39.0–52.0)
Hemoglobin: 8.3 g/dL — ABNORMAL LOW (ref 13.0–17.0)
MCH: 28 pg (ref 26.0–34.0)
MCHC: 31.7 g/dL (ref 30.0–36.0)
MCV: 88.5 fL (ref 78.0–100.0)
PLATELETS: 132 10*3/uL — AB (ref 150–400)
RBC: 2.96 MIL/uL — ABNORMAL LOW (ref 4.22–5.81)
RDW: 18.7 % — AB (ref 11.5–15.5)
WBC: 3.2 10*3/uL — AB (ref 4.0–10.5)

## 2017-05-05 LAB — MAGNESIUM: Magnesium: 2 mg/dL (ref 1.7–2.4)

## 2017-05-06 LAB — MAGNESIUM: Magnesium: 2.2 mg/dL (ref 1.7–2.4)

## 2017-05-06 LAB — RENAL FUNCTION PANEL
Albumin: 2.2 g/dL — ABNORMAL LOW (ref 3.5–5.0)
Anion gap: 11 (ref 5–15)
BUN: 47 mg/dL — AB (ref 6–20)
CHLORIDE: 89 mmol/L — AB (ref 101–111)
CO2: 41 mmol/L — AB (ref 22–32)
CREATININE: 1.12 mg/dL (ref 0.61–1.24)
Calcium: 8.9 mg/dL (ref 8.9–10.3)
GFR calc Af Amer: >60 mL/min (ref >60)
GFR calc non Af Amer: 59 mL/min — ABNORMAL LOW (ref >60)
GLUCOSE: 174 mg/dL — AB (ref 65–99)
Phosphorus: 2.6 mg/dL (ref 2.5–4.6)
Potassium: 3.5 mmol/L (ref 3.5–5.1)
SODIUM: 141 mmol/L (ref 135–145)

## 2017-05-06 LAB — CBC
HCT: 30.5 % — ABNORMAL LOW (ref 39.0–52.0)
Hemoglobin: 9.3 g/dL — ABNORMAL LOW (ref 13.0–17.0)
MCH: 27.5 pg (ref 26.0–34.0)
MCHC: 30.5 g/dL (ref 30.0–36.0)
MCV: 90.2 fL (ref 78.0–100.0)
PLATELETS: 146 10*3/uL — AB (ref 150–400)
RBC: 3.38 MIL/uL — ABNORMAL LOW (ref 4.22–5.81)
RDW: 19 % — AB (ref 11.5–15.5)
WBC: 4.6 10*3/uL (ref 4.0–10.5)

## 2017-05-07 LAB — CBC
HCT: 28.1 % — ABNORMAL LOW (ref 39.0–52.0)
Hemoglobin: 8.7 g/dL — ABNORMAL LOW (ref 13.0–17.0)
MCH: 28 pg (ref 26.0–34.0)
MCHC: 31 g/dL (ref 30.0–36.0)
MCV: 90.4 fL (ref 78.0–100.0)
PLATELETS: 135 10*3/uL — AB (ref 150–400)
RBC: 3.11 MIL/uL — AB (ref 4.22–5.81)
RDW: 18.9 % — ABNORMAL HIGH (ref 11.5–15.5)
WBC: 3.4 10*3/uL — ABNORMAL LOW (ref 4.0–10.5)

## 2017-05-07 LAB — RENAL FUNCTION PANEL
Albumin: 1.9 g/dL — ABNORMAL LOW (ref 3.5–5.0)
Anion gap: 8 (ref 5–15)
BUN: 48 mg/dL — ABNORMAL HIGH (ref 6–20)
CALCIUM: 8.7 mg/dL — AB (ref 8.9–10.3)
CO2: 39 mmol/L — AB (ref 22–32)
CREATININE: 0.95 mg/dL (ref 0.61–1.24)
Chloride: 92 mmol/L — ABNORMAL LOW (ref 101–111)
GFR calc Af Amer: >60 mL/min (ref >60)
GFR calc non Af Amer: >60 mL/min (ref >60)
GLUCOSE: 206 mg/dL — AB (ref 65–99)
Phosphorus: 3 mg/dL (ref 2.5–4.6)
Potassium: 3.6 mmol/L (ref 3.5–5.1)
SODIUM: 139 mmol/L (ref 135–145)

## 2017-05-07 LAB — MAGNESIUM: Magnesium: 1.8 mg/dL (ref 1.7–2.4)

## 2017-05-10 LAB — CBC
HCT: 30.9 % — ABNORMAL LOW (ref 39.0–52.0)
Hemoglobin: 9.5 g/dL — ABNORMAL LOW (ref 13.0–17.0)
MCH: 27.6 pg (ref 26.0–34.0)
MCHC: 30.7 g/dL (ref 30.0–36.0)
MCV: 89.8 fL (ref 78.0–100.0)
PLATELETS: 150 10*3/uL (ref 150–400)
RBC: 3.44 MIL/uL — AB (ref 4.22–5.81)
RDW: 18.9 % — ABNORMAL HIGH (ref 11.5–15.5)
WBC: 20.3 10*3/uL — AB (ref 4.0–10.5)

## 2017-05-10 LAB — BASIC METABOLIC PANEL
ANION GAP: 11 (ref 5–15)
BUN: 49 mg/dL — ABNORMAL HIGH (ref 6–20)
CO2: 38 mmol/L — ABNORMAL HIGH (ref 22–32)
Calcium: 8.8 mg/dL — ABNORMAL LOW (ref 8.9–10.3)
Chloride: 89 mmol/L — ABNORMAL LOW (ref 101–111)
Creatinine, Ser: 0.97 mg/dL (ref 0.61–1.24)
GFR calc Af Amer: >60 mL/min (ref >60)
Glucose, Bld: 253 mg/dL — ABNORMAL HIGH (ref 65–99)
POTASSIUM: 3.6 mmol/L (ref 3.5–5.1)
SODIUM: 138 mmol/L (ref 135–145)

## 2017-05-11 ENCOUNTER — Other Ambulatory Visit (HOSPITAL_COMMUNITY): Payer: Self-pay

## 2017-05-11 DIAGNOSIS — J9811 Atelectasis: Secondary | ICD-10-CM | POA: Diagnosis not present

## 2017-05-11 LAB — BASIC METABOLIC PANEL
ANION GAP: 12 (ref 5–15)
BUN: 43 mg/dL — ABNORMAL HIGH (ref 6–20)
CO2: 37 mmol/L — ABNORMAL HIGH (ref 22–32)
Calcium: 8.9 mg/dL (ref 8.9–10.3)
Chloride: 90 mmol/L — ABNORMAL LOW (ref 101–111)
Creatinine, Ser: 0.89 mg/dL (ref 0.61–1.24)
GFR calc non Af Amer: >60 mL/min (ref >60)
GLUCOSE: 167 mg/dL — AB (ref 65–99)
POTASSIUM: 3.7 mmol/L (ref 3.5–5.1)
Sodium: 139 mmol/L (ref 135–145)

## 2017-05-11 LAB — URINALYSIS, ROUTINE W REFLEX MICROSCOPIC
BACTERIA UA: NONE SEEN
Bilirubin Urine: NEGATIVE
Glucose, UA: NEGATIVE mg/dL
KETONES UR: NEGATIVE mg/dL
NITRITE: NEGATIVE
PROTEIN: NEGATIVE mg/dL
RBC / HPF: >50 RBC/hpf — ABNORMAL HIGH (ref 0–5)
Specific Gravity, Urine: 1.008 (ref 1.005–1.030)
pH: 7 (ref 5.0–8.0)

## 2017-05-11 LAB — LACTIC ACID, PLASMA
LACTIC ACID, VENOUS: 2.2 mmol/L — AB (ref 0.5–1.9)
Lactic Acid, Venous: 2.2 mmol/L (ref 0.5–1.9)

## 2017-05-11 LAB — CBC
HCT: 32.4 % — ABNORMAL LOW (ref 39.0–52.0)
Hemoglobin: 10.1 g/dL — ABNORMAL LOW (ref 13.0–17.0)
MCH: 27.9 pg (ref 26.0–34.0)
MCHC: 31.2 g/dL (ref 30.0–36.0)
MCV: 89.5 fL (ref 78.0–100.0)
Platelets: 145 10*3/uL — ABNORMAL LOW (ref 150–400)
RBC: 3.62 MIL/uL — AB (ref 4.22–5.81)
RDW: 19 % — ABNORMAL HIGH (ref 11.5–15.5)
WBC: 20 10*3/uL — AB (ref 4.0–10.5)

## 2017-05-11 LAB — C-REACTIVE PROTEIN: CRP: 7.1 mg/dL — ABNORMAL HIGH (ref <1.0)

## 2017-05-11 MED ORDER — GENERIC EXTERNAL MEDICATION
Status: DC
Start: ? — End: 2017-05-11

## 2017-05-12 ENCOUNTER — Other Ambulatory Visit (HOSPITAL_COMMUNITY): Payer: Self-pay

## 2017-05-12 DIAGNOSIS — R918 Other nonspecific abnormal finding of lung field: Secondary | ICD-10-CM | POA: Diagnosis not present

## 2017-05-12 LAB — LACTIC ACID, PLASMA
LACTIC ACID, VENOUS: 2 mmol/L — AB (ref 0.5–1.9)
LACTIC ACID, VENOUS: 3.1 mmol/L — AB (ref 0.5–1.9)
LACTIC ACID, VENOUS: 2.3 mmol/L — AB (ref 0.5–1.9)
Lactic Acid, Venous: 1.7 mmol/L (ref 0.5–1.9)

## 2017-05-12 LAB — CBC
HCT: 27.9 % — ABNORMAL LOW (ref 39.0–52.0)
HEMOGLOBIN: 8.7 g/dL — AB (ref 13.0–17.0)
MCH: 28.2 pg (ref 26.0–34.0)
MCHC: 31.2 g/dL (ref 30.0–36.0)
MCV: 90.3 fL (ref 78.0–100.0)
PLATELETS: 139 10*3/uL — AB (ref 150–400)
RBC: 3.09 MIL/uL — ABNORMAL LOW (ref 4.22–5.81)
RDW: 19.4 % — ABNORMAL HIGH (ref 11.5–15.5)
WBC: 17.1 10*3/uL — ABNORMAL HIGH (ref 4.0–10.5)

## 2017-05-12 LAB — AMMONIA: Ammonia: 25 umol/L (ref 9–35)

## 2017-05-13 LAB — CBC
HEMATOCRIT: 26.6 % — AB (ref 39.0–52.0)
HEMOGLOBIN: 8.2 g/dL — AB (ref 13.0–17.0)
MCH: 28 pg (ref 26.0–34.0)
MCHC: 30.8 g/dL (ref 30.0–36.0)
MCV: 90.8 fL (ref 78.0–100.0)
Platelets: 130 10*3/uL — ABNORMAL LOW (ref 150–400)
RBC: 2.93 MIL/uL — ABNORMAL LOW (ref 4.22–5.81)
RDW: 19.7 % — AB (ref 11.5–15.5)
WBC: 14.3 10*3/uL — AB (ref 4.0–10.5)

## 2017-05-13 LAB — BASIC METABOLIC PANEL
ANION GAP: 6 (ref 5–15)
BUN: 43 mg/dL — ABNORMAL HIGH (ref 6–20)
CHLORIDE: 98 mmol/L — AB (ref 101–111)
CO2: 37 mmol/L — AB (ref 22–32)
Calcium: 7.8 mg/dL — ABNORMAL LOW (ref 8.9–10.3)
Creatinine, Ser: 0.77 mg/dL (ref 0.61–1.24)
GFR calc non Af Amer: >60 mL/min (ref >60)
GLUCOSE: 123 mg/dL — AB (ref 65–99)
POTASSIUM: 3.6 mmol/L (ref 3.5–5.1)
Sodium: 141 mmol/L (ref 135–145)

## 2017-05-13 LAB — LACTIC ACID, PLASMA
LACTIC ACID, VENOUS: 1.6 mmol/L (ref 0.5–1.9)
LACTIC ACID, VENOUS: 2 mmol/L — AB (ref 0.5–1.9)
LACTIC ACID, VENOUS: 2.2 mmol/L — AB (ref 0.5–1.9)
LACTIC ACID, VENOUS: 2.2 mmol/L — AB (ref 0.5–1.9)

## 2017-05-13 LAB — CULTURE, RESPIRATORY

## 2017-05-13 LAB — CULTURE, RESPIRATORY W GRAM STAIN: Culture: NORMAL

## 2017-05-13 LAB — URINE CULTURE

## 2017-05-14 LAB — LACTIC ACID, PLASMA
LACTIC ACID, VENOUS: 1.8 mmol/L (ref 0.5–1.9)
Lactic Acid, Venous: 1.6 mmol/L (ref 0.5–1.9)
Lactic Acid, Venous: 1.5 mmol/L (ref 0.5–1.9)
Lactic Acid, Venous: 1.8 mmol/L (ref 0.5–1.9)

## 2017-05-14 LAB — VANCOMYCIN, TROUGH: Vancomycin Tr: 13 ug/mL — ABNORMAL LOW (ref 15–20)

## 2017-05-15 ENCOUNTER — Other Ambulatory Visit (HOSPITAL_COMMUNITY): Payer: Self-pay

## 2017-05-15 DIAGNOSIS — K5730 Diverticulosis of large intestine without perforation or abscess without bleeding: Secondary | ICD-10-CM | POA: Diagnosis not present

## 2017-05-15 LAB — LACTIC ACID, PLASMA
LACTIC ACID, VENOUS: 1.2 mmol/L (ref 0.5–1.9)
LACTIC ACID, VENOUS: 1.2 mmol/L (ref 0.5–1.9)

## 2017-05-16 LAB — BASIC METABOLIC PANEL
ANION GAP: 8 (ref 5–15)
BUN: 34 mg/dL — ABNORMAL HIGH (ref 6–20)
CALCIUM: 8.1 mg/dL — AB (ref 8.9–10.3)
CHLORIDE: 96 mmol/L — AB (ref 101–111)
CO2: 32 mmol/L (ref 22–32)
Creatinine, Ser: 0.91 mg/dL (ref 0.61–1.24)
GFR calc non Af Amer: >60 mL/min (ref >60)
Glucose, Bld: 115 mg/dL — ABNORMAL HIGH (ref 65–99)
Potassium: 3.9 mmol/L (ref 3.5–5.1)
Sodium: 136 mmol/L (ref 135–145)

## 2017-05-16 LAB — CBC
HEMATOCRIT: 26.9 % — AB (ref 39.0–52.0)
HEMOGLOBIN: 8.5 g/dL — AB (ref 13.0–17.0)
MCH: 29 pg (ref 26.0–34.0)
MCHC: 31.6 g/dL (ref 30.0–36.0)
MCV: 91.8 fL (ref 78.0–100.0)
Platelets: 137 10*3/uL — ABNORMAL LOW (ref 150–400)
RBC: 2.93 MIL/uL — ABNORMAL LOW (ref 4.22–5.81)
RDW: 21.3 % — ABNORMAL HIGH (ref 11.5–15.5)
WBC: 13.6 10*3/uL — AB (ref 4.0–10.5)

## 2017-05-16 LAB — CULTURE, BLOOD (ROUTINE X 2)
Culture: NO GROWTH
Culture: NO GROWTH
Special Requests: ADEQUATE

## 2017-05-16 LAB — AMMONIA: Ammonia: 11 umol/L (ref 9–35)

## 2017-05-16 NOTE — Consult Note (Signed)
Chief Complaint: Patient was seen in consultation today for percutaneous gastric tube placement at the request of Dr Mariah Milling    Supervising Physician: Sandi Mariscal  Patient Status: Select IP  History of Present Illness: Carlos Harrington is a 84 y.o. male   Altered Mental Status CVA 2016 HTN; CHF; Afib Bullous Pemphigoid Encephalitis Aspiration pneumonia Hypoxia Dysphagia; protein calorie malnutrition  Request for percutaneous gastric tube placement Imaging reviewed and approved for procedure  Scheduled now for same   Past Medical History:  Diagnosis Date  . Anxiety   . CAD (coronary artery disease)    with stenting X 4 in [place]  . Depression   . Hyperlipidemia   . Hypertension   . Macular degeneration   . Mild cognitive impairment 01/07/2016    Past Surgical History:  Procedure Laterality Date  . APPENDECTOMY    . CHOLECYSTECTOMY    . TONSILLECTOMY      Allergies: Amlodipine and Carvedilol  Medications: Prior to Admission medications   Medication Sig Start Date End Date Taking? Authorizing Provider  albuterol (PROVENTIL HFA;VENTOLIN HFA) 108 (90 Base) MCG/ACT inhaler Inhale 1-2 puffs into the lungs every 6 (six) hours as needed for wheezing or shortness of breath. 08/03/16   Trixie Dredge, PA-C  albuterol (PROVENTIL) (2.5 MG/3ML) 0.083% nebulizer solution Take 3 mLs (2.5 mg total) by nebulization every 6 (six) hours as needed for wheezing or shortness of breath. 10/03/16   Gregor Hams, MD  AMBULATORY NON FORMULARY MEDICATION Stair Lift at home for use as needed. 10/06/15   Gregor Hams, MD  AMBULATORY NON FORMULARY MEDICATION Continuous positive airway pressure (CPAP) machine auto-titrate from 4-20 cm of H2O pressure, with all supplemental supplies as needed. 05/25/16   Gregor Hams, MD  amoxicillin-clavulanate (AUGMENTIN) 875-125 MG tablet Take 1 tablet by mouth every 12 (twelve) hours. 03/08/17   [provider]  atorvastatin (LIPITOR) 40 MG  tablet Take 40 mg by mouth daily. 09/07/16   [provider]  cetirizine (ZYRTEC) 10 MG tablet Take 1 tablet (10 mg total) by mouth daily. 07/11/16   Gregor Hams, MD  clobetasol cream (TEMOVATE) 6.31 % Apply 1 application topically 2 (two) times daily. 08/02/15   Marcial Pacas, DO  doxazosin (CARDURA) 4 MG tablet TAKE ONE TABLET BY MOUTH AT BEDTIME 04/09/17   Gregor Hams, MD  gabapentin (NEURONTIN) 100 MG capsule TAKE ONE PILL TWO HOURS BEFORE BEDTIME 01/31/17   [provider]  guaiFENesin-codeine 100-10 MG/5ML syrup Take 5 mLs by mouth at bedtime as needed for cough. 11/29/16   Gregor Hams, MD  hydrOXYzine (ATARAX/VISTARIL) 25 MG tablet Take 1-2 tablets (25-50 mg total) by mouth every 8 (eight) hours as needed. itching 03/12/17   Gregor Hams, MD  mometasone (ELOCON) 0.1 % cream apply to area twice a day 01/12/17   [provider]  mycophenolate (CELLCEPT) 500 MG tablet Take 1,000 mg by mouth 2 (two) times daily. 02/28/17   [provider]  predniSONE (DELTASONE) 20 MG tablet Take 60 mg by mouth daily. 02/28/17   [provider]  QC LO-DOSE ASPIRIN 81 MG EC tablet TAKE ONE TABLET BY MOUTH EVERY DAY 04/09/17   Gregor Hams, MD  sertraline (ZOLOFT) 100 MG tablet TAKE ONE TABLET BY MOUTH EVERY DAY 03/21/17   Gregor Hams, MD  SSD 1 % cream APPLY TOPICALLY TWICE DAILY TO OPEN SORES 02/28/17   [provider]  torsemide (DEMADEX) 20 MG tablet Take 20  mg by mouth daily. 03/08/17   [provider]  triamcinolone (KENALOG) 0.1 % paste Use as directed 1 application in the mouth or throat 2 (two) times daily. 03/12/17   Gregor Hams, MD  triamcinolone cream (KENALOG) 0.1 % Apply 1 application topically 2 (two) times daily. 11/29/16   Gregor Hams, MD  vitamin B-12 (CYANOCOBALAMIN) 1000 MCG tablet Take one tablet by mouth daily 02/15/17   Gregor Hams, MD  XARELTO 20 MG TABS tablet Take one tablet (20 mg total) by mouth daily. 02/15/17   [provider]     Family History  Problem Relation Age of Onset  . Hypertension Mother   . Hyperlipidemia Mother   . Diabetes Mother   . Other Mother        CVA  . Depression Mother   . Other Sister        brain tumor  . Other Brother        CVA    Social History   Socioeconomic History  . Marital status: Married    Spouse name: Not on file  . Number of children: Not on file  . Years of education: Not on file  . Highest education level: Not on file  Occupational History  . Not on file  Social Needs  . Financial resource strain: Not on file  . Food insecurity:    Worry: Not on file    Inability: Not on file  . Transportation needs:    Medical: Not on file    Non-medical: Not on file  Tobacco Use  . Smoking status: Never Smoker  . Smokeless tobacco: Never Used  Substance and Sexual Activity  . Alcohol use: Yes    Comment: 2 daily  . Drug use: No  . Sexual activity: Not Currently  Lifestyle  . Physical activity:    Days per week: Not on file    Minutes per session: Not on file  . Stress: Not on file  Relationships  . Social connections:    Talks on phone: Not on file    Gets together: Not on file    Attends religious service: Not on file    Active member of club or organization: Not on file    Attends meetings of clubs or organizations: Not on file    Relationship status: Not on file  Other Topics Concern  . Not on file  Social History Narrative  . Not on file    Review of Systems: A 12 point ROS discussed and pertinent positives are indicated in the HPI above.  All other systems are negative.  Review of Systems  Constitutional:       Ill appearing NG in place Non verbal    Vital Signs: There were no vitals taken for this visit.  Physical Exam  Cardiovascular: Normal rate and regular rhythm.  Pulmonary/Chest: Effort normal and breath sounds normal.  Abdominal: Soft. There is no tenderness.  Musculoskeletal:  Moving all 4s  spontaneously Does not follow commands  Skin: Skin is warm and dry.  Psychiatric:  spoke to Carlos Harrington on phone Wants to discuss G tube again with MD before consenting  Nursing note and vitals reviewed.   Imaging: Ct Abdomen Wo Contrast  Result Date: 05/15/2017 CLINICAL DATA:  Dysphagia, unexplained. Preop planning for gastrostomy tube placement. EXAM: CT ABDOMEN WITHOUT CONTRAST TECHNIQUE: Multidetector CT imaging of the abdomen was performed following the standard protocol without IV contrast. COMPARISON:  CT chest 02/24/2016 FINDINGS:  Lower chest: Patchy airspace infiltrates in the visualized posterior lower lobes. Stable right lower lobe calcified granuloma. Coronary calcifications. No pleural or pericardial effusion. Hepatobiliary: No focal liver abnormality is seen. Status post cholecystectomy. No biliary dilatation. Pancreas: Unremarkable. No pancreatic ductal dilatation or surrounding inflammatory changes. Spleen: Normal size.  Scattered small calcified granulomas. Adrenals/Urinary Tract: Adrenal glands are unremarkable. Kidneys are normal, without renal calculi, focal lesion, or hydronephrosis. Probable distended urinary bladder extends up to the umbilicus, incompletely visualized. It does contain gas bubble suggesting recent instrumentation. Stomach/Bowel: Nasogastric tube extends into the partially decompressed stomach. There is safe percutaneous access for gastrostomy placement. Visualized portions of small bowel and colon are nondilated. Multiple distal descending and sigmoid diverticula are noted without adjacent inflammatory/edematous change or abscess. Vascular/Lymphatic: Aortoiliac moderate calcified plaque without aneurysm. Other: No ascites.  No free air. Musculoskeletal: Spondylitic changes in the visualized lower thoracic and lumbar spine. Negative for fracture or worrisome bone lesion. IMPRESSION: 1. Gastric anatomy is amenable for percutaneous gastrostomy placement. 2. Patchy  airspace opacities in the visualized lung bases. 3. Distended urinary bladder is suspected although the pelvis is not completely imaged. 4. Descending and sigmoid diverticulosis. Electronically Signed   By: Lucrezia Europe M.D.   On: 05/15/2017 16:06   Dg Chest Port 1 View  Result Date: 05/12/2017 CLINICAL DATA:  Altered mental status. EXAM: PORTABLE CHEST 1 VIEW COMPARISON:  05/11/2017 and prior radiographs FINDINGS: UPPER limits normal heart size again noted. An NG tube is again identified entering the stomach with tip off the field of view. A RIGHT PICC line has been removed. Bilateral LOWER lung opacities, LEFT-greater-than-RIGHT, are unchanged. There is no evidence of pneumothorax. IMPRESSION: RIGHT PICC line removal, otherwise little interval change with continued bilateral LOWER lung opacities, LEFT-greater-than-RIGHT. Electronically Signed   By: Margarette Canada M.D.   On: 05/12/2017 21:17   Dg Chest Port 1 View  Result Date: 05/11/2017 CLINICAL DATA:  Increased white cell count. EXAM: PORTABLE CHEST 1 VIEW COMPARISON:  05/04/2017 and prior radiograph FINDINGS: The cardiomediastinal silhouette is unchanged. An NG tube entering the stomach and RIGHT PICC line with tip overlying the SUPERIOR cavoatrial junction again noted. Mid-lower LEFT lung airspace opacities are unchanged. Mild RIGHT basilar opacities/atelectasis again noted. No definite pleural effusion or pneumothorax noted. No significant changes identified. IMPRESSION: Little interval change with continued LEFT lung airspace opacities and mild RIGHT basilar opacity/atelectasis. Electronically Signed   By: Margarette Canada M.D.   On: 05/11/2017 14:54   Dg Chest Port 1 View  Result Date: 05/04/2017 CLINICAL DATA:  Shortness of breath EXAM: PORTABLE CHEST 1 VIEW COMPARISON:  Portable chest x-ray of 05/02/2017 FINDINGS: The lungs are not as well aerated. Cardiomegaly is stable and there is still evidence of pulmonary vascular congestion. Small left effusion  cannot be excluded. Right central venous line tip remains with the tip overlying the expected SVC-RA junction. NG tube extends below the hemidiaphragm. IMPRESSION: 1. Little change in cardiomegaly and probable pulmonary vascular congestion. 2. Slightly diminished aeration. Electronically Signed   By: Ivar Drape M.D.   On: 05/04/2017 09:21   Dg Chest Port 1 View  Result Date: 05/02/2017 CLINICAL DATA:  Respiratory distress.  Shortness of breath. EXAM: PORTABLE CHEST 1 VIEW COMPARISON:  04/27/2017 FINDINGS: Shallow inspiration. Cardiac enlargement with pulmonary vascular congestion. Mild interstitial changes in the lung bases likely representing early interstitial edema. Mild progression since previous study. Linear atelectasis in the right lung base. No consolidation. No blunting of costophrenic angles. No pneumothorax. Right PICC  line with tip over the cavoatrial junction region. IMPRESSION: Developing congestive changes with cardiac enlargement, pulmonary vascular congestion, and mild interstitial edema. Electronically Signed   By: Lucienne Capers M.D.   On: 05/02/2017 23:21   Dg Chest Port 1 View  Result Date: 04/27/2017 CLINICAL DATA:  Shortness of breath and cough. EXAM: PORTABLE CHEST 1 VIEW COMPARISON:  09/11/2016. FINDINGS: Right arm PICC tip is at the SVC RA junction in probability. Monitor lead overlies the distal catheter. Cardiomegaly again seen. Worsened pulmonary density probably representing mild edema. No consolidation or collapse. No visible effusion. IMPRESSION: Increased lung density most consistent with edema/fluid overload. Electronically Signed   By: Nelson Chimes M.D.   On: 04/27/2017 10:02   Dg Abd Portable 1v  Result Date: 05/03/2017 CLINICAL DATA:  84 year old male status post nasogastric tube manipulation EXAM: PORTABLE ABDOMEN - 1 VIEW COMPARISON:  Prior abdominal radiograph obtained earlier today at 14:26 p.m. FINDINGS: The nasogastric tube has been advanced. Both the proximal  side hole and tip are present within the gastric bubble. The bowel gas pattern is not overtly obstructed. There is a large colonic stool burden. Diverticulosis is noted. Nonspecific bibasilar airspace opacities. No acute osseous abnormality. IMPRESSION: 1. Slight interval advancement of nasogastric tube. The tip and proximal side hole both overlie the gastric bubble. 2. Diverticulosis. 3. Large colonic stool burden. Electronically Signed   By: Jacqulynn Cadet M.D.   On: 05/03/2017 19:45   Dg Abd Portable 1v  Result Date: 05/03/2017 CLINICAL DATA:  NG tube. EXAM: PORTABLE ABDOMEN - 1 VIEW COMPARISON:  05/02/2017 FINDINGS: The patient has a nasogastric tube, tip overlying the level of the stomach. Surgical clips are present in the RIGHT UPPER QUADRANT. There is moderate stool burden throughout mildly distended loops of colon. No evidence for free air or bowel obstruction. There is patchy density in the LEFT lung base partially obscuring the hemidiaphragm. IMPRESSION: Nasogastric tube to the level of the stomach. Moderate stool burden. LEFT LOWER lobe atelectasis or infiltrate. Electronically Signed   By: Nolon Nations M.D.   On: 05/03/2017 14:44    Labs:  CBC: Recent Labs    05/11/17 1125 05/12/17 0407 05/13/17 0204 05/16/17 0716  WBC 20.0* 17.1* 14.3* 13.6*  HGB 10.1* 8.7* 8.2* 8.5*  HCT 32.4* 27.9* 26.6* 26.9*  PLT 145* 139* 130* 137*    COAGS: Recent Labs    04/27/17 0617  INR 1.18    BMP: Recent Labs    05/10/17 1512 05/11/17 1125 05/13/17 0204 05/16/17 0716  NA 138 139 141 136  K 3.6 3.7 3.6 3.9  CL 89* 90* 98* 96*  CO2 38* 37* 37* 32  GLUCOSE 253* 167* 123* 115*  BUN 49* 43* 43* 34*  CALCIUM 8.8* 8.9 7.8* 8.1*  CREATININE 0.97 0.89 0.77 0.91  GFRNONAA >60 >60 >60 >60  GFRAA >60 >60 >60 >60    LIVER FUNCTION TESTS: Recent Labs    07/11/16 1101 09/11/16 1026 12/20/16 1104 03/20/17 1524 04/26/17 1847 05/05/17 0925 05/06/17 0614 05/07/17 0614  BILITOT  0.7 0.7 0.5 0.6 0.8  --   --   --   AST 27 13 21 22 25   --   --   --   ALT 24 15 18  40 31  --   --   --   ALKPHOS 111  --   --   --  73  --   --   --   PROT 7.0 6.1 6.5 6.2 5.3*  --   --   --  ALBUMIN 4.2  --   --   --  2.3* 2.2* 2.2* 1.9*    TUMOR MARKERS: No results for input(s): AFPTM, CEA, CA199, CHROMGRNA in the last 8760 hours.  Assessment and Plan:  AMS; encephalitis Dysphagia Protein calorie malnutrition Scheduled for percutaneous gastric tube -- Awaiting Dtr consent   Thank you for this interesting consult.  I greatly enjoyed meeting Carlos Harrington and look forward to participating in their care.  A copy of this report was sent to the requesting provider on this date.  Electronically Signed: Lavonia Drafts, PA-C 05/16/2017, 8:23 AM   I spent a total of 40 Minutes    in face to face in clinical consultation, greater than 50% of which was counseling/coordinating care for perc G tube

## 2017-05-17 NOTE — Progress Notes (Signed)
Daughter present at bedside today. Thoroughly discussed plan for IR placed perc G-tube. Risks and benefits discussed with the patient including, but not limited to the need for a barium enema during the procedure, bleeding, infection, peritonitis, or damage to adjacent structures.  All of the patient's questions were answered, patient is agreeable to proceed. Consent signed and in chart.  TF to be stopped at [place].  Ascencion Dike PA-C Interventional Radiology 05/17/2017 2:23 PM

## 2017-05-18 ENCOUNTER — Other Ambulatory Visit (HOSPITAL_COMMUNITY): Payer: Self-pay

## 2017-05-18 DIAGNOSIS — E46 Unspecified protein-calorie malnutrition: Secondary | ICD-10-CM | POA: Diagnosis not present

## 2017-05-18 HISTORY — PX: IR GASTROSTOMY TUBE MOD SED: IMG625

## 2017-05-18 LAB — PROTIME-INR
INR: 1.21
PROTHROMBIN TIME: 15.2 s (ref 11.4–15.2)

## 2017-05-18 MED ORDER — LIDOCAINE HCL 1 % IJ SOLN
INTRAMUSCULAR | Status: AC
  Filled 2017-05-18: qty 20

## 2017-05-18 MED ORDER — CEFAZOLIN SODIUM-DEXTROSE 2-4 GM/100ML-% IV SOLN: 2.0000 g | Freq: Once | INTRAVENOUS | Status: DC

## 2017-05-18 MED ORDER — GENERIC EXTERNAL MEDICATION
Status: DC
Start: ? — End: 2017-05-18

## 2017-05-18 MED ORDER — SODIUM CHLORIDE 0.9 % IV SOLN
INTRAVENOUS | Status: AC | PRN
  Administered 2017-05-18: 10 mL/h via INTRAVENOUS

## 2017-05-18 MED ORDER — IOPAMIDOL (ISOVUE-300) INJECTION 61%
INTRAVENOUS | Status: AC
Start: 2017-05-18 — End: 2017-05-18
  Administered 2017-05-18: 10 mL
  Filled 2017-05-18: qty 50

## 2017-05-18 MED ORDER — GLUCAGON HCL RDNA (DIAGNOSTIC) 1 MG IJ SOLR
INTRAMUSCULAR | Status: AC
  Administered 2017-05-18: 0.5 mg
  Filled 2017-05-18: qty 1

## 2017-05-18 MED ORDER — HYDROMORPHONE HCL 1 MG/ML IJ SOLN: 1.0000 mg | INTRAMUSCULAR | Status: DC | PRN

## 2017-05-18 MED ORDER — CEFAZOLIN SODIUM-DEXTROSE 2-4 GM/100ML-% IV SOLN
INTRAVENOUS | Status: AC
  Filled 2017-05-18: qty 100

## 2017-05-18 MED ORDER — ONDANSETRON HCL 4 MG/2ML IJ SOLN: 4.0000 mg | INTRAMUSCULAR | Status: DC | PRN

## 2017-05-18 MED ORDER — FENTANYL CITRATE (PF) 100 MCG/2ML IJ SOLN
INTRAMUSCULAR | Status: AC | PRN
  Administered 2017-05-18: 50 ug via INTRAVENOUS

## 2017-05-18 MED ORDER — FENTANYL CITRATE (PF) 100 MCG/2ML IJ SOLN
INTRAMUSCULAR | Status: AC
  Filled 2017-05-18: qty 2

## 2017-05-18 MED ORDER — LIDOCAINE HCL (PF) 1 % IJ SOLN
INTRAMUSCULAR | Status: AC | PRN
  Administered 2017-05-18: 5 mL

## 2017-05-18 MED ORDER — MIDAZOLAM HCL 2 MG/2ML IJ SOLN
INTRAMUSCULAR | Status: AC
  Filled 2017-05-18: qty 2

## 2017-05-18 MED ORDER — HYDROCODONE-ACETAMINOPHEN 5-325 MG PO TABS: 1.0000 | ORAL_TABLET | ORAL | Status: DC | PRN

## 2017-05-18 MED ORDER — MIDAZOLAM HCL 2 MG/2ML IJ SOLN
INTRAMUSCULAR | Status: AC | PRN
  Administered 2017-05-18: 1 mg via INTRAVENOUS

## 2017-05-18 NOTE — Telephone Encounter (Signed)
No additional notes found

## 2017-05-18 NOTE — Procedures (Signed)
  Procedure: Gastrostomy tube placement 20f EBL:   minimal Complications:  none immediate  See full dictation in [facility] PACS.  D. Kaileena Obi MD Main # [phone] Pager  [phone]    

## 2017-05-19 NOTE — Progress Notes (Signed)
Patient ID: Carlos Harrington, male   DOB: 1932/11/03, 84 y.o.   MRN: [id number] G tube intact, insertion site ok, no leaking or erythema, abd soft; ok to use tube when needed

## 2017-05-22 ENCOUNTER — Encounter (HOSPITAL_COMMUNITY): Payer: Self-pay | Admitting: Interventional Radiology

## 2017-05-23 LAB — CBC
HCT: 32.6 % — ABNORMAL LOW (ref 39.0–52.0)
Hemoglobin: 10.2 g/dL — ABNORMAL LOW (ref 13.0–17.0)
MCH: 28.7 pg (ref 26.0–34.0)
MCHC: 31.3 g/dL (ref 30.0–36.0)
MCV: 91.6 fL (ref 78.0–100.0)
PLATELETS: 179 10*3/uL (ref 150–400)
RBC: 3.56 MIL/uL — AB (ref 4.22–5.81)
RDW: 21.1 % — AB (ref 11.5–15.5)
WBC: 12.7 10*3/uL — ABNORMAL HIGH (ref 4.0–10.5)

## 2017-05-23 LAB — BASIC METABOLIC PANEL
Anion gap: 17 — ABNORMAL HIGH (ref 5–15)
BUN: 72 mg/dL — AB (ref 6–20)
CO2: 33 mmol/L — ABNORMAL HIGH (ref 22–32)
CREATININE: 1.63 mg/dL — AB (ref 0.61–1.24)
Calcium: 8.9 mg/dL (ref 8.9–10.3)
Chloride: 91 mmol/L — ABNORMAL LOW (ref 101–111)
GFR calc Af Amer: 44 mL/min — ABNORMAL LOW (ref >60)
GFR, EST NON AFRICAN AMERICAN: 38 mL/min — AB (ref >60)
GLUCOSE: 172 mg/dL — AB (ref 65–99)
Potassium: 4 mmol/L (ref 3.5–5.1)
Sodium: 141 mmol/L (ref 135–145)

## 2017-05-29 LAB — BASIC METABOLIC PANEL
Anion gap: 22 — ABNORMAL HIGH (ref 5–15)
BUN: 99 mg/dL — ABNORMAL HIGH (ref 6–20)
CALCIUM: 9.5 mg/dL (ref 8.9–10.3)
CO2: 33 mmol/L — ABNORMAL HIGH (ref 22–32)
CREATININE: 1.83 mg/dL — AB (ref 0.61–1.24)
Chloride: 96 mmol/L — ABNORMAL LOW (ref 101–111)
GFR calc non Af Amer: 33 mL/min — ABNORMAL LOW (ref >60)
GFR, EST AFRICAN AMERICAN: 38 mL/min — AB (ref >60)
Glucose, Bld: 127 mg/dL — ABNORMAL HIGH (ref 65–99)
Potassium: 3.9 mmol/L (ref 3.5–5.1)
SODIUM: 151 mmol/L — AB (ref 135–145)

## 2017-05-29 LAB — CBC
HCT: 49.8 % (ref 39.0–52.0)
Hemoglobin: 15.2 g/dL (ref 13.0–17.0)
MCH: 29.6 pg (ref 26.0–34.0)
MCHC: 30.5 g/dL (ref 30.0–36.0)
MCV: 96.9 fL (ref 78.0–100.0)
Platelets: 115 10*3/uL — ABNORMAL LOW (ref 150–400)
RBC: 5.14 MIL/uL (ref 4.22–5.81)
RDW: 22 % — AB (ref 11.5–15.5)
WBC: 12.9 10*3/uL — ABNORMAL HIGH (ref 4.0–10.5)

## 2017-05-31 LAB — BASIC METABOLIC PANEL
Anion gap: 16 — ABNORMAL HIGH (ref 5–15)
BUN: 92 mg/dL — AB (ref 6–20)
CALCIUM: 8.8 mg/dL — AB (ref 8.9–10.3)
CHLORIDE: 90 mmol/L — AB (ref 101–111)
CO2: 37 mmol/L — AB (ref 22–32)
CREATININE: 1.69 mg/dL — AB (ref 0.61–1.24)
GFR calc non Af Amer: 36 mL/min — ABNORMAL LOW (ref >60)
GFR, EST AFRICAN AMERICAN: 42 mL/min — AB (ref >60)
Glucose, Bld: 129 mg/dL — ABNORMAL HIGH (ref 65–99)
Potassium: 3.5 mmol/L (ref 3.5–5.1)
Sodium: 143 mmol/L (ref 135–145)

## 2017-06-02 DIAGNOSIS — F0281 Dementia in other diseases classified elsewhere with behavioral disturbance: Secondary | ICD-10-CM | POA: Diagnosis not present

## 2017-06-02 LAB — BASIC METABOLIC PANEL
ANION GAP: 16 — AB (ref 5–15)
BUN: 56 mg/dL — ABNORMAL HIGH (ref 6–20)
CO2: 31 mmol/L (ref 22–32)
Calcium: 8.7 mg/dL — ABNORMAL LOW (ref 8.9–10.3)
Chloride: 89 mmol/L — ABNORMAL LOW (ref 101–111)
Creatinine, Ser: 1.21 mg/dL (ref 0.61–1.24)
GFR, EST NON AFRICAN AMERICAN: 54 mL/min — AB (ref >60)
Glucose, Bld: 94 mg/dL (ref 65–99)
POTASSIUM: 3.4 mmol/L — AB (ref 3.5–5.1)
Sodium: 136 mmol/L (ref 135–145)

## 2017-06-02 LAB — CBC
HCT: 37.4 % — ABNORMAL LOW (ref 39.0–52.0)
Hemoglobin: 11.7 g/dL — ABNORMAL LOW (ref 13.0–17.0)
MCH: 29.9 pg (ref 26.0–34.0)
MCHC: 31.3 g/dL (ref 30.0–36.0)
MCV: 95.7 fL (ref 78.0–100.0)
PLATELETS: 124 10*3/uL — AB (ref 150–400)
RBC: 3.91 MIL/uL — AB (ref 4.22–5.81)
RDW: 19.9 % — ABNORMAL HIGH (ref 11.5–15.5)
WBC: 11 10*3/uL — AB (ref 4.0–10.5)

## 2017-06-03 DIAGNOSIS — F02818 Dementia in other diseases classified elsewhere, unspecified severity, with other behavioral disturbance: Secondary | ICD-10-CM | POA: Insufficient documentation

## 2017-06-03 DIAGNOSIS — F0281 Dementia in other diseases classified elsewhere with behavioral disturbance: Secondary | ICD-10-CM | POA: Insufficient documentation

## 2017-06-03 LAB — POTASSIUM: Potassium: 3.5 mmol/L (ref 3.5–5.1)

## 2017-06-03 NOTE — Consult Note (Signed)
[place] [facility] Consult   Reason for Consult:  agitation Referring Physician: Dr. 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 Canker sores oral [K12.0] 03/12/2017  . Bullous pemphigoid [L12.0] 03/04/2017  . Right lower lobe lung mass [R91.8] 03/04/2017  . Diastolic congestive heart failure ([place]) [I50.30] 02/07/2017  . Healthcare-associated pneumonia [J18.9] 10/15/2016  . Atrial fibrillation ([place]) [I48.91] 10/03/2016  . Recurrent major depressive disorder, in partial remission ([place]) [F33.41] 08/10/2016  . Acquired bronchomalacia [J98.09] 08/03/2016  . OSA (obstructive sleep apnea) [G47.33] 05/01/2016  . Pulmonary nodule [R91.1] 02/24/2016  . Mild cognitive impairment [G31.84] 01/07/2016  . Progressive cognitive dysfunction [F09] 10/25/2015  . Acute combined systolic and diastolic congestive heart failure ([place]) [I50.41] 10/06/2015  . DNR (do not resuscitate) [Z66] 10/06/2015  . Right sided weakness [R53.1] 05/25/2014  . Cough [R05] 09/26/2013  . Fecal incontinence [R15.9] 08/28/2013  . Anxiety [F41.9] 05/05/2013  . GERD (gastroesophageal reflux disease) [K21.9] 03/24/2013  . BPH with elevated PSA [N40.0, R97.20] 12/17/2012  . Pre-diabetes [R73.03] 11/27/2012  . Right carotid bruit [R09.89] 09/24/2012  . CKD (chronic kidney disease) stage 3, GFR 30-59 ml/min (HCC) [N18.3] 02/21/2012  . Wears hearing aid [Z97.4] 10/11/2011  . HYPERTENSIVE RETINOPATHY [H35.039] 09/17/2009  . [place] [V69.7948] 09/17/2009  . 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 Vitamin B 12 deficiency [E53.8] 07/29/2009  . THROMBOCYTOPENIA [D69.6] 06/24/2009  .  Hyperlipidemia [E78.5] 05/28/2009  . OBESITY, CLASS I [E66.3] 05/28/2009  . MACULAR DEGENERATION [H35.30] 05/28/2009  . Essential hypertension, benign [I10] 05/28/2009  . Coronary artery disease with history of myocardial infarction without history of CABG [I25.10, I25.2] 05/28/2009    Total Time spent with patient: 45 minutes  Subjective:   Carlos Harrington is a 84 y.o. 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 Patient is a poor historian, he is unable to give a coherent sequence of events that lead to his current hospitalizations. Informations were obtained from chart and his provider. Per report, patient has become combative, confused and getting easily agitated. However, his doctor reports that he seems to be doing fairly okay on Depakote and Risperdal but his behavior could be unpredictable.   Past Psychiatric History: as above  Risk to Self:   Risk to Others:   Prior Inpatient Therapy:   Prior Outpatient Therapy:    Past Medical History:  Past Medical History:  Diagnosis Date  . Anxiety   . CAD (coronary artery disease)    with stenting X 4 in [place]  . Depression   . Hyperlipidemia   . Hypertension   . Macular degeneration   . Mild cognitive impairment 01/07/2016    Past Surgical History:  Procedure Laterality Date  . APPENDECTOMY    . CHOLECYSTECTOMY    . IR GASTROSTOMY TUBE MOD SED  05/18/2017  . TONSILLECTOMY     Family History:  Family History  Problem Relation Age of Onset  . Hypertension Mother   . Hyperlipidemia Mother   . Diabetes Mother   . Other Mother  CVA  . Depression Mother   . Other Sister        brain tumor  . Other Brother        CVA   Family Psychiatric   History: Social History:  Social History   Substance and Sexual Activity  Alcohol Use Yes   Comment: 2 daily     Social History   Substance and Sexual Activity  Drug Use No    Social History   Socioeconomic History  . Marital status: Married    Spouse name: Not on file  . Number of children: Not on file  . Years of education: Not on file  . Highest education level: Not on file  Occupational History  . Not on file  Social Needs  . Financial resource strain: Not on file  . Food insecurity:    Worry: Not on file    Inability: Not on file  . Transportation needs:    Medical: Not on file    Non-medical: Not on file  Tobacco Use  . Smoking status: Never Smoker  . Smokeless tobacco: Never Used  Substance and Sexual Activity  . Alcohol use: Yes    Comment: 2 daily  . Drug use: No  . Sexual activity: Not Currently  Lifestyle  . Physical activity:    Days per week: Not on file    Minutes per session: Not on file  . Stress: Not on file  Relationships  . Social connections:    Talks on phone: Not on file    Gets together: Not on file    Attends religious service: Not on file    Active member of club or organization: Not on file    Attends meetings of clubs or organizations: Not on file    Relationship status: Not on file  Other Topics Concern  . Not on file  Social History Narrative  . Not on file   Additional Social History:    Allergies:   Allergies  Allergen Reactions  . Amlodipine Itching  . Carvedilol Itching    Labs:  Results for orders placed or performed during the hospital encounter of 04/26/17 (from the past 48 hour(s))  CBC     Status: Abnormal   Collection Time: 06/02/17  8:43 AM  Result Value Ref Range   WBC 11.0 (H) 4.0 - 10.5 K/uL   RBC 3.91 (L) 4.22 - 5.81 MIL/uL   Hemoglobin 11.7 (L) 13.0 - 17.0 g/dL   HCT 37.4 (L) 39.0 - 52.0 %   MCV 95.7 78.0 - 100.0 fL   MCH 29.9 26.0 - 34.0 pg   MCHC 31.3 30.0 - 36.0 g/dL   RDW 19.9 (H) 11.5 -  15.5 %   Platelets 124 (L) 150 - 400 K/uL    Comment: Performed at [facility] [place] [facility], [place] [address] [place], [place] [address]  Basic metabolic panel     Status: Abnormal   Collection Time: 06/02/17  8:43 AM  Result Value Ref Range   Sodium 136 135 - 145 mmol/L   Potassium 3.4 (L) 3.5 - 5.1 mmol/L   Chloride 89 (L) 101 - 111 mmol/L   CO2 31 22 - 32 mmol/L   Glucose, Bld 94 65 - 99 mg/dL   BUN 56 (H) 6 - 20 mg/dL   Creatinine, Ser 1.21 0.61 - 1.24 mg/dL   Calcium 8.7 (L) 8.9 - 10.3 mg/dL   GFR calc non Af Amer 54 (L) >60 mL/min   GFR calc Af  Amer >60 >60 mL/min    Comment: (NOTE) The eGFR has been calculated using the CKD EPI equation. This calculation has not been validated in all clinical situations. eGFR's persistently <60 mL/min signify possible Chronic Kidney Disease.    Anion gap 16 (H) 5 - 15    Comment: Performed at [facility], [place] [address], [place], [place] [address]  Potassium     Status: None   Collection Time: 06/03/17  8:07 AM  Result Value Ref Range   Potassium 3.5 3.5 - 5.1 mmol/L    Comment: Performed at [facility] [address], [place], [place] [address]    Current Facility-Administered Medications  Medication Dose Route Frequency Provider Last Rate Last Dose  . ceFAZolin (ANCEF) IVPB 2g/100 mL premix  2 g Intravenous Once Monia Sabal, PA-C      . HYDROcodone-acetaminophen (NORCO/VICODIN) 5-325 MG per tablet 1-2 tablet  1-2 tablet Oral Q4H PRN Arne Cleveland, MD      . HYDROmorphone (DILAUDID) injection 1 mg  1 mg Intravenous Q2H PRN Arne Cleveland, MD      . ondansetron [facility]) injection 4 mg  4 mg Intravenous Q4H PRN Arne Cleveland, MD        Musculoskeletal: Strength & Muscle Tone: within normal limits Gait & Station: lying in bed Patient leans: N/A  Psychiatric Specialty Exam: Physical Exam  Psychiatric: Thought content normal. His affect is labile. His speech is tangential and slurred. He is agitated. Cognition and  memory are impaired. He expresses impulsivity.    Review of Systems  Constitutional: Negative.   HENT: Negative.   Eyes: Negative.   Gastrointestinal: Negative.   Skin: Negative.   Endo/Heme/Allergies: Negative.   Psychiatric/Behavioral: Positive for memory loss.    Blood pressure (!) 121/57, pulse 93, resp. rate 19, SpO2 96 %.There is no height or weight on file to calculate BMI.  General Appearance: Casual  Eye Contact:  Minimal  Speech:  Garbled and Pressured  Volume:  Decreased  Mood:  Irritable  Affect:  Blunt  Thought Process:  Disorganized  Orientation:  Other:  only to person  Thought Content:  unable to assess, patient is confused  Suicidal Thoughts:  unable to assess, patient is confused  Homicidal Thoughts:  unable to assess, patient is confused  Memory:  unable to assess, patient is confused  Judgement:  Impaired  Insight:  Lacking  Psychomotor Activity:  Restlessness  Concentration:  Concentration: Poor and Attention Span: Poor  Recall:  Poor  Fund of Knowledge:  Poor  Language:  Fair  Akathisia:  No  Handed:  Right  AIMS (if indicated):     Assets:  Financial Resources/Insurance Housing Social Support  ADL's:  Impaired  Cognition:  Impaired,  Moderate  Sleep:        Treatment Plan Summary: Medication management/Recommendations: -Review old chart -Collateral information from provider and family -Continue current Depakote dose for agitation -Increase Risperdal to 1 mg twice daily for aggression -May benefit from Neurology evaluation to determine the extent of cognitive limitation. -Continue Amantadine 100 mg daily for EPS prevention.  Disposition:-F/u in 1-2 weeks  Corena Pilgrim, MD 06/03/2017 3:52 PM

## 2017-06-05 DIAGNOSIS — I499 Cardiac arrhythmia, unspecified: Secondary | ICD-10-CM | POA: Diagnosis not present

## 2017-06-05 DIAGNOSIS — G4733 Obstructive sleep apnea (adult) (pediatric): Secondary | ICD-10-CM | POA: Diagnosis not present

## 2017-06-05 DIAGNOSIS — E872 Acidosis: Secondary | ICD-10-CM | POA: Diagnosis present

## 2017-06-05 DIAGNOSIS — N183 Chronic kidney disease, stage 3 (moderate): Secondary | ICD-10-CM | POA: Diagnosis present

## 2017-06-05 DIAGNOSIS — E873 Alkalosis: Secondary | ICD-10-CM | POA: Diagnosis not present

## 2017-06-05 DIAGNOSIS — G9389 Other specified disorders of brain: Secondary | ICD-10-CM | POA: Diagnosis not present

## 2017-06-05 DIAGNOSIS — E43 Unspecified severe protein-calorie malnutrition: Secondary | ICD-10-CM | POA: Diagnosis present

## 2017-06-05 DIAGNOSIS — G0490 Encephalitis and encephalomyelitis, unspecified: Secondary | ICD-10-CM | POA: Diagnosis not present

## 2017-06-05 DIAGNOSIS — I120 Hypertensive chronic kidney disease with stage 5 chronic kidney disease or end stage renal disease: Secondary | ICD-10-CM | POA: Diagnosis not present

## 2017-06-05 DIAGNOSIS — I959 Hypotension, unspecified: Secondary | ICD-10-CM | POA: Diagnosis not present

## 2017-06-05 DIAGNOSIS — R310 Gross hematuria: Secondary | ICD-10-CM | POA: Diagnosis not present

## 2017-06-05 DIAGNOSIS — R6520 Severe sepsis without septic shock: Secondary | ICD-10-CM | POA: Diagnosis present

## 2017-06-05 DIAGNOSIS — D649 Anemia, unspecified: Secondary | ICD-10-CM | POA: Diagnosis not present

## 2017-06-05 DIAGNOSIS — R1310 Dysphagia, unspecified: Secondary | ICD-10-CM | POA: Diagnosis not present

## 2017-06-05 DIAGNOSIS — J690 Pneumonitis due to inhalation of food and vomit: Secondary | ICD-10-CM | POA: Diagnosis present

## 2017-06-05 DIAGNOSIS — E0800 Diabetes mellitus due to underlying condition with hyperosmolarity without nonketotic hyperglycemic-hyperosmolar coma (NKHHC): Secondary | ICD-10-CM | POA: Diagnosis not present

## 2017-06-05 DIAGNOSIS — Z515 Encounter for palliative care: Secondary | ICD-10-CM | POA: Diagnosis present

## 2017-06-05 DIAGNOSIS — I739 Peripheral vascular disease, unspecified: Secondary | ICD-10-CM | POA: Diagnosis not present

## 2017-06-05 DIAGNOSIS — Z6824 Body mass index (BMI) 24.0-24.9, adult: Secondary | ICD-10-CM | POA: Diagnosis not present

## 2017-06-05 DIAGNOSIS — G319 Degenerative disease of nervous system, unspecified: Secondary | ICD-10-CM | POA: Diagnosis not present

## 2017-06-05 DIAGNOSIS — A403 Sepsis due to Streptococcus pneumoniae: Secondary | ICD-10-CM | POA: Diagnosis not present

## 2017-06-05 DIAGNOSIS — G9341 Metabolic encephalopathy: Secondary | ICD-10-CM | POA: Diagnosis present

## 2017-06-05 DIAGNOSIS — I482 Chronic atrial fibrillation: Secondary | ICD-10-CM | POA: Diagnosis present

## 2017-06-05 DIAGNOSIS — L120 Bullous pemphigoid: Secondary | ICD-10-CM | POA: Diagnosis not present

## 2017-06-05 DIAGNOSIS — I480 Paroxysmal atrial fibrillation: Secondary | ICD-10-CM | POA: Diagnosis not present

## 2017-06-05 DIAGNOSIS — E876 Hypokalemia: Secondary | ICD-10-CM | POA: Diagnosis not present

## 2017-06-05 DIAGNOSIS — A419 Sepsis, unspecified organism: Secondary | ICD-10-CM | POA: Diagnosis present

## 2017-06-05 DIAGNOSIS — R0603 Acute respiratory distress: Secondary | ICD-10-CM | POA: Diagnosis not present

## 2017-06-05 DIAGNOSIS — Z8673 Personal history of transient ischemic attack (TIA), and cerebral infarction without residual deficits: Secondary | ICD-10-CM | POA: Diagnosis not present

## 2017-06-05 DIAGNOSIS — N390 Urinary tract infection, site not specified: Secondary | ICD-10-CM | POA: Diagnosis not present

## 2017-06-05 DIAGNOSIS — L89159 Pressure ulcer of sacral region, unspecified stage: Secondary | ICD-10-CM | POA: Diagnosis present

## 2017-06-05 DIAGNOSIS — R4182 Altered mental status, unspecified: Secondary | ICD-10-CM | POA: Diagnosis not present

## 2017-06-05 DIAGNOSIS — N401 Enlarged prostate with lower urinary tract symptoms: Secondary | ICD-10-CM | POA: Diagnosis not present

## 2017-06-05 DIAGNOSIS — Z743 Need for continuous supervision: Secondary | ICD-10-CM | POA: Diagnosis not present

## 2017-06-05 DIAGNOSIS — R0681 Apnea, not elsewhere classified: Secondary | ICD-10-CM | POA: Diagnosis not present

## 2017-06-05 DIAGNOSIS — F419 Anxiety disorder, unspecified: Secondary | ICD-10-CM | POA: Diagnosis not present

## 2017-06-05 DIAGNOSIS — I252 Old myocardial infarction: Secondary | ICD-10-CM | POA: Diagnosis not present

## 2017-06-05 DIAGNOSIS — K5900 Constipation, unspecified: Secondary | ICD-10-CM | POA: Diagnosis not present

## 2017-06-05 DIAGNOSIS — I130 Hypertensive heart and chronic kidney disease with heart failure and stage 1 through stage 4 chronic kidney disease, or unspecified chronic kidney disease: Secondary | ICD-10-CM | POA: Diagnosis present

## 2017-06-05 DIAGNOSIS — I2510 Atherosclerotic heart disease of native coronary artery without angina pectoris: Secondary | ICD-10-CM | POA: Diagnosis not present

## 2017-06-05 DIAGNOSIS — K279 Peptic ulcer, site unspecified, unspecified as acute or chronic, without hemorrhage or perforation: Secondary | ICD-10-CM | POA: Diagnosis not present

## 2017-06-05 DIAGNOSIS — R451 Restlessness and agitation: Secondary | ICD-10-CM | POA: Diagnosis not present

## 2017-06-05 DIAGNOSIS — Z66 Do not resuscitate: Secondary | ICD-10-CM | POA: Diagnosis present

## 2017-06-05 DIAGNOSIS — G43909 Migraine, unspecified, not intractable, without status migrainosus: Secondary | ICD-10-CM | POA: Diagnosis not present

## 2017-06-05 DIAGNOSIS — J189 Pneumonia, unspecified organism: Secondary | ICD-10-CM | POA: Diagnosis not present

## 2017-06-05 DIAGNOSIS — J9602 Acute respiratory failure with hypercapnia: Secondary | ICD-10-CM | POA: Diagnosis present

## 2017-06-05 DIAGNOSIS — M6281 Muscle weakness (generalized): Secondary | ICD-10-CM | POA: Diagnosis not present

## 2017-06-05 DIAGNOSIS — B351 Tinea unguium: Secondary | ICD-10-CM | POA: Diagnosis not present

## 2017-06-05 DIAGNOSIS — I5032 Chronic diastolic (congestive) heart failure: Secondary | ICD-10-CM | POA: Diagnosis present

## 2017-06-05 DIAGNOSIS — R279 Unspecified lack of coordination: Secondary | ICD-10-CM | POA: Diagnosis not present

## 2017-06-05 DIAGNOSIS — E785 Hyperlipidemia, unspecified: Secondary | ICD-10-CM | POA: Diagnosis present

## 2017-06-05 DIAGNOSIS — B004 Herpesviral encephalitis: Secondary | ICD-10-CM | POA: Diagnosis not present

## 2017-06-05 DIAGNOSIS — E569 Vitamin deficiency, unspecified: Secondary | ICD-10-CM | POA: Diagnosis not present

## 2017-06-05 DIAGNOSIS — N179 Acute kidney failure, unspecified: Secondary | ICD-10-CM | POA: Diagnosis not present

## 2017-06-05 DIAGNOSIS — Z299 Encounter for prophylactic measures, unspecified: Secondary | ICD-10-CM | POA: Diagnosis not present

## 2017-06-05 DIAGNOSIS — J9601 Acute respiratory failure with hypoxia: Secondary | ICD-10-CM | POA: Diagnosis present

## 2017-06-05 DIAGNOSIS — J449 Chronic obstructive pulmonary disease, unspecified: Secondary | ICD-10-CM | POA: Diagnosis present

## 2017-06-28 DIAGNOSIS — N179 Acute kidney failure, unspecified: Secondary | ICD-10-CM | POA: Diagnosis not present

## 2017-06-28 DIAGNOSIS — E876 Hypokalemia: Secondary | ICD-10-CM | POA: Diagnosis not present

## 2017-06-30 DIAGNOSIS — E872 Acidosis: Secondary | ICD-10-CM | POA: Diagnosis not present

## 2017-06-30 DIAGNOSIS — R4182 Altered mental status, unspecified: Secondary | ICD-10-CM | POA: Diagnosis not present

## 2017-06-30 DIAGNOSIS — Z7189 Other specified counseling: Secondary | ICD-10-CM | POA: Diagnosis not present

## 2017-06-30 DIAGNOSIS — R0681 Apnea, not elsewhere classified: Secondary | ICD-10-CM | POA: Diagnosis not present

## 2017-06-30 DIAGNOSIS — J988 Other specified respiratory disorders: Secondary | ICD-10-CM | POA: Diagnosis not present

## 2017-06-30 DIAGNOSIS — M533 Sacrococcygeal disorders, not elsewhere classified: Secondary | ICD-10-CM | POA: Diagnosis not present

## 2017-06-30 DIAGNOSIS — J9601 Acute respiratory failure with hypoxia: Secondary | ICD-10-CM | POA: Diagnosis not present

## 2017-06-30 DIAGNOSIS — E785 Hyperlipidemia, unspecified: Secondary | ICD-10-CM | POA: Diagnosis present

## 2017-06-30 DIAGNOSIS — J9602 Acute respiratory failure with hypercapnia: Secondary | ICD-10-CM | POA: Diagnosis not present

## 2017-06-30 DIAGNOSIS — N183 Chronic kidney disease, stage 3 (moderate): Secondary | ICD-10-CM | POA: Diagnosis not present

## 2017-06-30 DIAGNOSIS — I5032 Chronic diastolic (congestive) heart failure: Secondary | ICD-10-CM | POA: Diagnosis present

## 2017-06-30 DIAGNOSIS — Z8673 Personal history of transient ischemic attack (TIA), and cerebral infarction without residual deficits: Secondary | ICD-10-CM | POA: Diagnosis not present

## 2017-06-30 DIAGNOSIS — I959 Hypotension, unspecified: Secondary | ICD-10-CM | POA: Diagnosis not present

## 2017-06-30 DIAGNOSIS — I499 Cardiac arrhythmia, unspecified: Secondary | ICD-10-CM | POA: Diagnosis not present

## 2017-06-30 DIAGNOSIS — Z66 Do not resuscitate: Secondary | ICD-10-CM | POA: Diagnosis not present

## 2017-06-30 DIAGNOSIS — J690 Pneumonitis due to inhalation of food and vomit: Secondary | ICD-10-CM | POA: Diagnosis not present

## 2017-06-30 DIAGNOSIS — R451 Restlessness and agitation: Secondary | ICD-10-CM | POA: Diagnosis not present

## 2017-06-30 DIAGNOSIS — E43 Unspecified severe protein-calorie malnutrition: Secondary | ICD-10-CM | POA: Diagnosis present

## 2017-06-30 DIAGNOSIS — J449 Chronic obstructive pulmonary disease, unspecified: Secondary | ICD-10-CM | POA: Diagnosis present

## 2017-06-30 DIAGNOSIS — I482 Chronic atrial fibrillation: Secondary | ICD-10-CM | POA: Diagnosis present

## 2017-06-30 DIAGNOSIS — N179 Acute kidney failure, unspecified: Secondary | ICD-10-CM | POA: Diagnosis not present

## 2017-06-30 DIAGNOSIS — G9389 Other specified disorders of brain: Secondary | ICD-10-CM | POA: Diagnosis not present

## 2017-06-30 DIAGNOSIS — R6520 Severe sepsis without septic shock: Secondary | ICD-10-CM | POA: Diagnosis present

## 2017-06-30 DIAGNOSIS — Z6824 Body mass index (BMI) 24.0-24.9, adult: Secondary | ICD-10-CM | POA: Diagnosis not present

## 2017-06-30 DIAGNOSIS — G9341 Metabolic encephalopathy: Secondary | ICD-10-CM | POA: Diagnosis not present

## 2017-06-30 DIAGNOSIS — E873 Alkalosis: Secondary | ICD-10-CM | POA: Diagnosis not present

## 2017-06-30 DIAGNOSIS — L89159 Pressure ulcer of sacral region, unspecified stage: Secondary | ICD-10-CM | POA: Diagnosis present

## 2017-06-30 DIAGNOSIS — A419 Sepsis, unspecified organism: Secondary | ICD-10-CM | POA: Diagnosis not present

## 2017-06-30 DIAGNOSIS — R0603 Acute respiratory distress: Secondary | ICD-10-CM | POA: Diagnosis not present

## 2017-06-30 DIAGNOSIS — I130 Hypertensive heart and chronic kidney disease with heart failure and stage 1 through stage 4 chronic kidney disease, or unspecified chronic kidney disease: Secondary | ICD-10-CM | POA: Diagnosis present

## 2017-06-30 DIAGNOSIS — I252 Old myocardial infarction: Secondary | ICD-10-CM | POA: Diagnosis not present

## 2017-06-30 DIAGNOSIS — Z515 Encounter for palliative care: Secondary | ICD-10-CM | POA: Diagnosis not present

## 2017-06-30 DIAGNOSIS — I4891 Unspecified atrial fibrillation: Secondary | ICD-10-CM | POA: Diagnosis not present

## 2017-06-30 DIAGNOSIS — I480 Paroxysmal atrial fibrillation: Secondary | ICD-10-CM | POA: Diagnosis not present

## 2017-06-30 DIAGNOSIS — G319 Degenerative disease of nervous system, unspecified: Secondary | ICD-10-CM | POA: Diagnosis not present

## 2017-07-05 MED ORDER — GENERIC EXTERNAL MEDICATION
Status: DC
Start: ? — End: 2017-07-05

## 2017-07-05 MED ORDER — SODIUM CHLORIDE 0.9 % IV SOLN
10.00 | INTRAVENOUS | Status: DC
Start: ? — End: 2017-07-05

## 2017-07-05 MED ORDER — HALOPERIDOL LACTATE 5 MG/ML IJ SOLN
1.00 | INTRAMUSCULAR | Status: DC
Start: ? — End: 2017-07-05

## 2017-07-05 MED ORDER — GLYCOPYRROLATE 0.2 MG/ML IJ SOLN
0.20 | INTRAMUSCULAR | Status: DC
Start: ? — End: 2017-07-05

## 2017-07-05 MED ORDER — HYDROCORTISONE NA SUCCINATE PF 100 MG IJ SOLR
100.00 | INTRAMUSCULAR | Status: DC
Start: 2017-07-03 — End: 2017-07-05

## 2017-07-05 MED ORDER — GENERIC EXTERNAL MEDICATION
0.04 | Status: DC
Start: ? — End: 2017-07-05

## 2017-07-05 MED ORDER — ACETAMINOPHEN 160 MG/5ML PO SUSP
650.00 | ORAL | Status: DC
Start: ? — End: 2017-07-05

## 2017-07-05 MED ORDER — ALBUTEROL SULFATE (2.5 MG/3ML) 0.083% IN NEBU
2.50 | INHALATION_SOLUTION | RESPIRATORY_TRACT | Status: DC
Start: ? — End: 2017-07-05

## 2017-07-05 MED ORDER — MORPHINE SULFATE (PF) 4 MG/ML IV SOLN
2.00 | INTRAVENOUS | Status: DC
Start: ? — End: 2017-07-05

## 2017-07-05 MED ORDER — RISPERIDONE 0.5 MG PO TBDP
0.50 | ORAL_TABLET | ORAL | Status: DC
Start: 2017-07-03 — End: 2017-07-05

## 2017-07-05 MED ORDER — SALINE NASAL SPRAY 0.65 % NA SOLN
2.00 | NASAL | Status: DC
Start: ? — End: 2017-07-05

## 2017-07-05 MED ORDER — BIOTENE ORALBALANCE DRY MOUTH MT GEL
OROMUCOSAL | Status: DC
Start: ? — End: 2017-07-05

## 2017-07-05 MED ORDER — FENTANYL 25 MCG/HR TD PT72
1.00 | MEDICATED_PATCH | TRANSDERMAL | Status: DC
Start: 2017-07-05 — End: 2017-07-05

## 2017-07-05 MED ORDER — GENERIC EXTERNAL MEDICATION
0.08 | Status: DC
Start: ? — End: 2017-07-05

## 2017-07-12 ENCOUNTER — Telehealth: Payer: Self-pay | Admitting: Family Medicine

## 2017-07-12 NOTE — Telephone Encounter (Addendum)
Mr. Carlos Harrington daughter, Carlos Harrington called.  She wanted to let you know that her Dad passed  and  to thank you for the good care you gave to him. Mr Carlos Harrington last days were spent at [facility] and she said the service there  was terrible, staff was rude, etc.

## 2017-07-13 ENCOUNTER — Encounter: Payer: Self-pay | Admitting: Family Medicine

## 2017-07-13 NOTE — Telephone Encounter (Signed)
I tried to call his daughter [name] however she did not answer her phone and her mailbox is full.  Please attempt to call her again to get her address.  I would like to send her a letter as a way of saying goodbye to [place].  I do not have her address in the system.

## 2017-07-13 NOTE — Telephone Encounter (Signed)
Epic updated 

## 2017-07-13 NOTE — Telephone Encounter (Signed)
Letter and flowers sent.  Please update patient status to deceased in epic.

## 2017-07-13 NOTE — Telephone Encounter (Signed)
Daughters mailing address same as listed on Pt's chart:  [place] [id number]

## 2017-08-02 DEATH — deceased

## 2018-11-17 IMAGING — DX DG ABD PORTABLE 1V
1 series · 1 of 1 positions shown · non-contrast
Comparison: 05/02/2017

CLINICAL DATA: NG tube.

EXAM:
PORTABLE ABDOMEN - 1 VIEW

[abdomen kub]
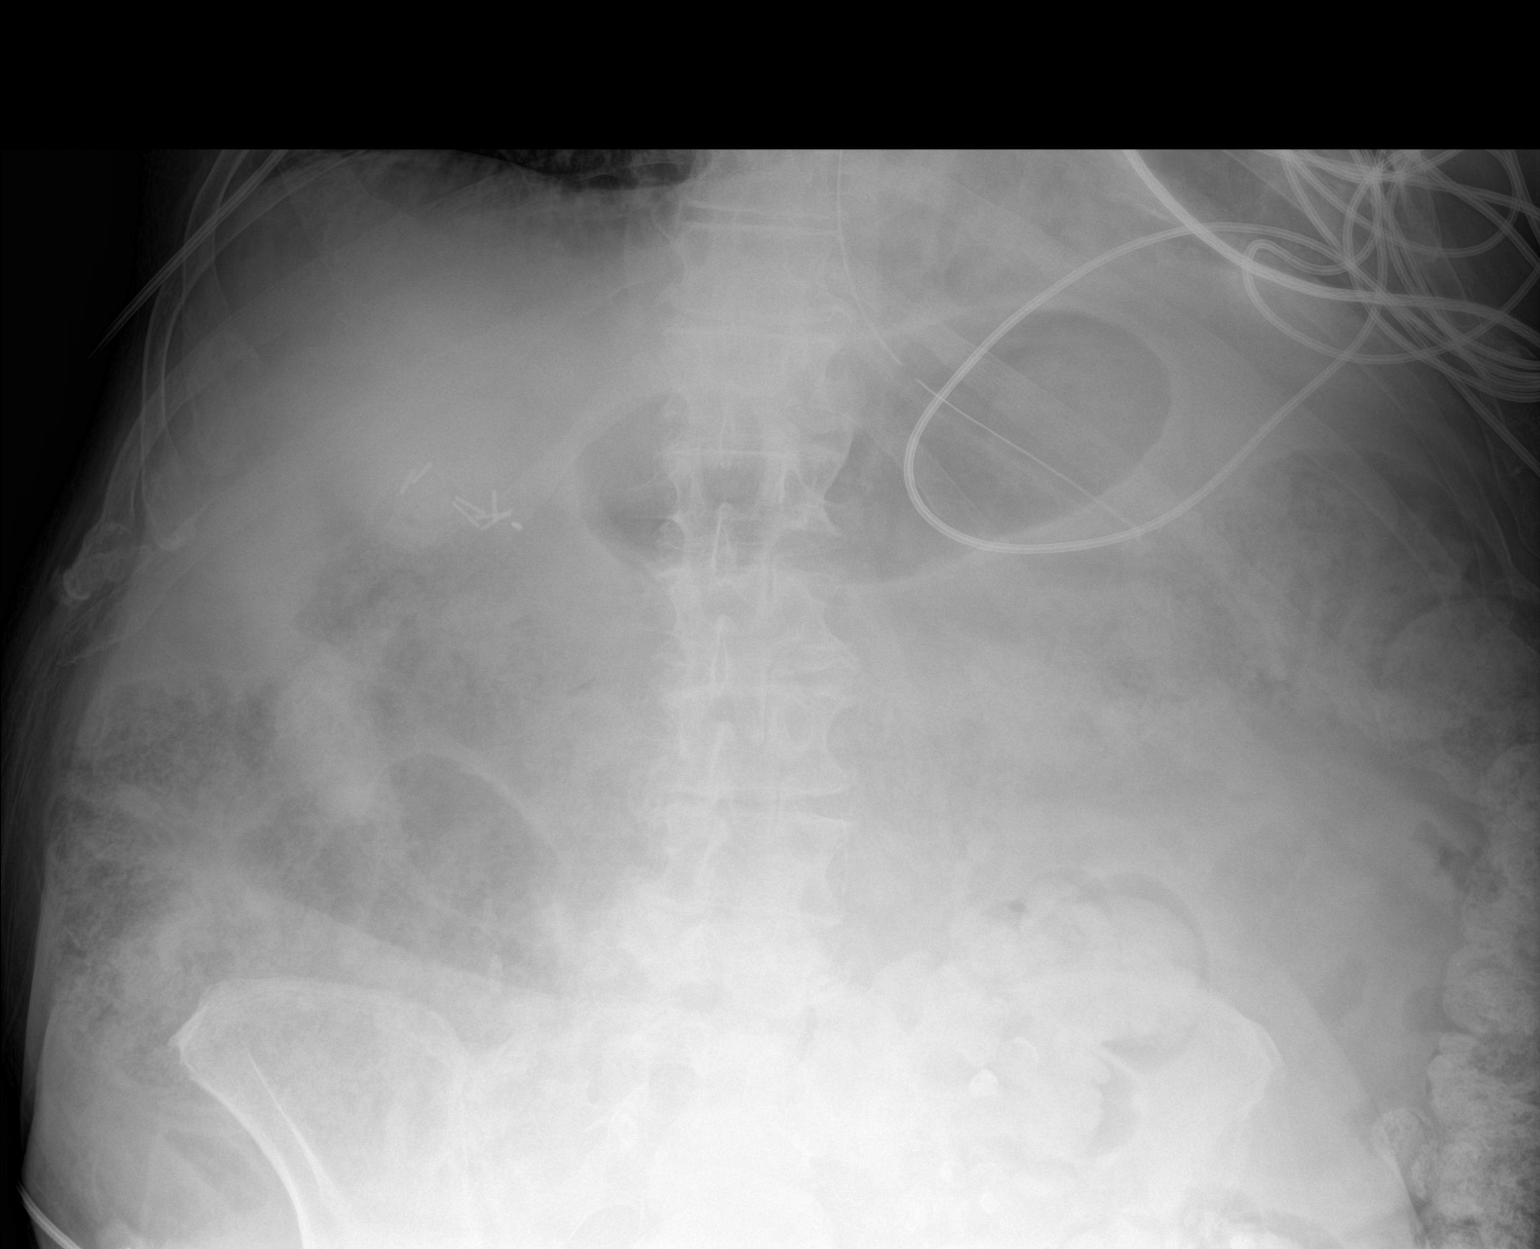

[1 of 1 positions shown; findings below may reference images not displayed]

FINDINGS: The patient has a nasogastric tube, tip overlying the level of the
stomach. Surgical clips are present in the RIGHT UPPER QUADRANT.
There is moderate stool burden throughout mildly distended loops of
colon. No evidence for free air or bowel obstruction. There is
patchy density in the LEFT lung base partially obscuring the
hemidiaphragm.
IMPRESSION: Nasogastric tube to the level of the stomach.

Moderate stool burden.

LEFT LOWER lobe atelectasis or infiltrate.

## 2018-11-25 IMAGING — DX DG CHEST 1V PORT
1 series · 1 of 1 positions shown · non-contrast
Comparison: 05/04/2017 and prior radiograph

CLINICAL DATA: Increased white cell count.

EXAM:
PORTABLE CHEST 1 VIEW

[chest]
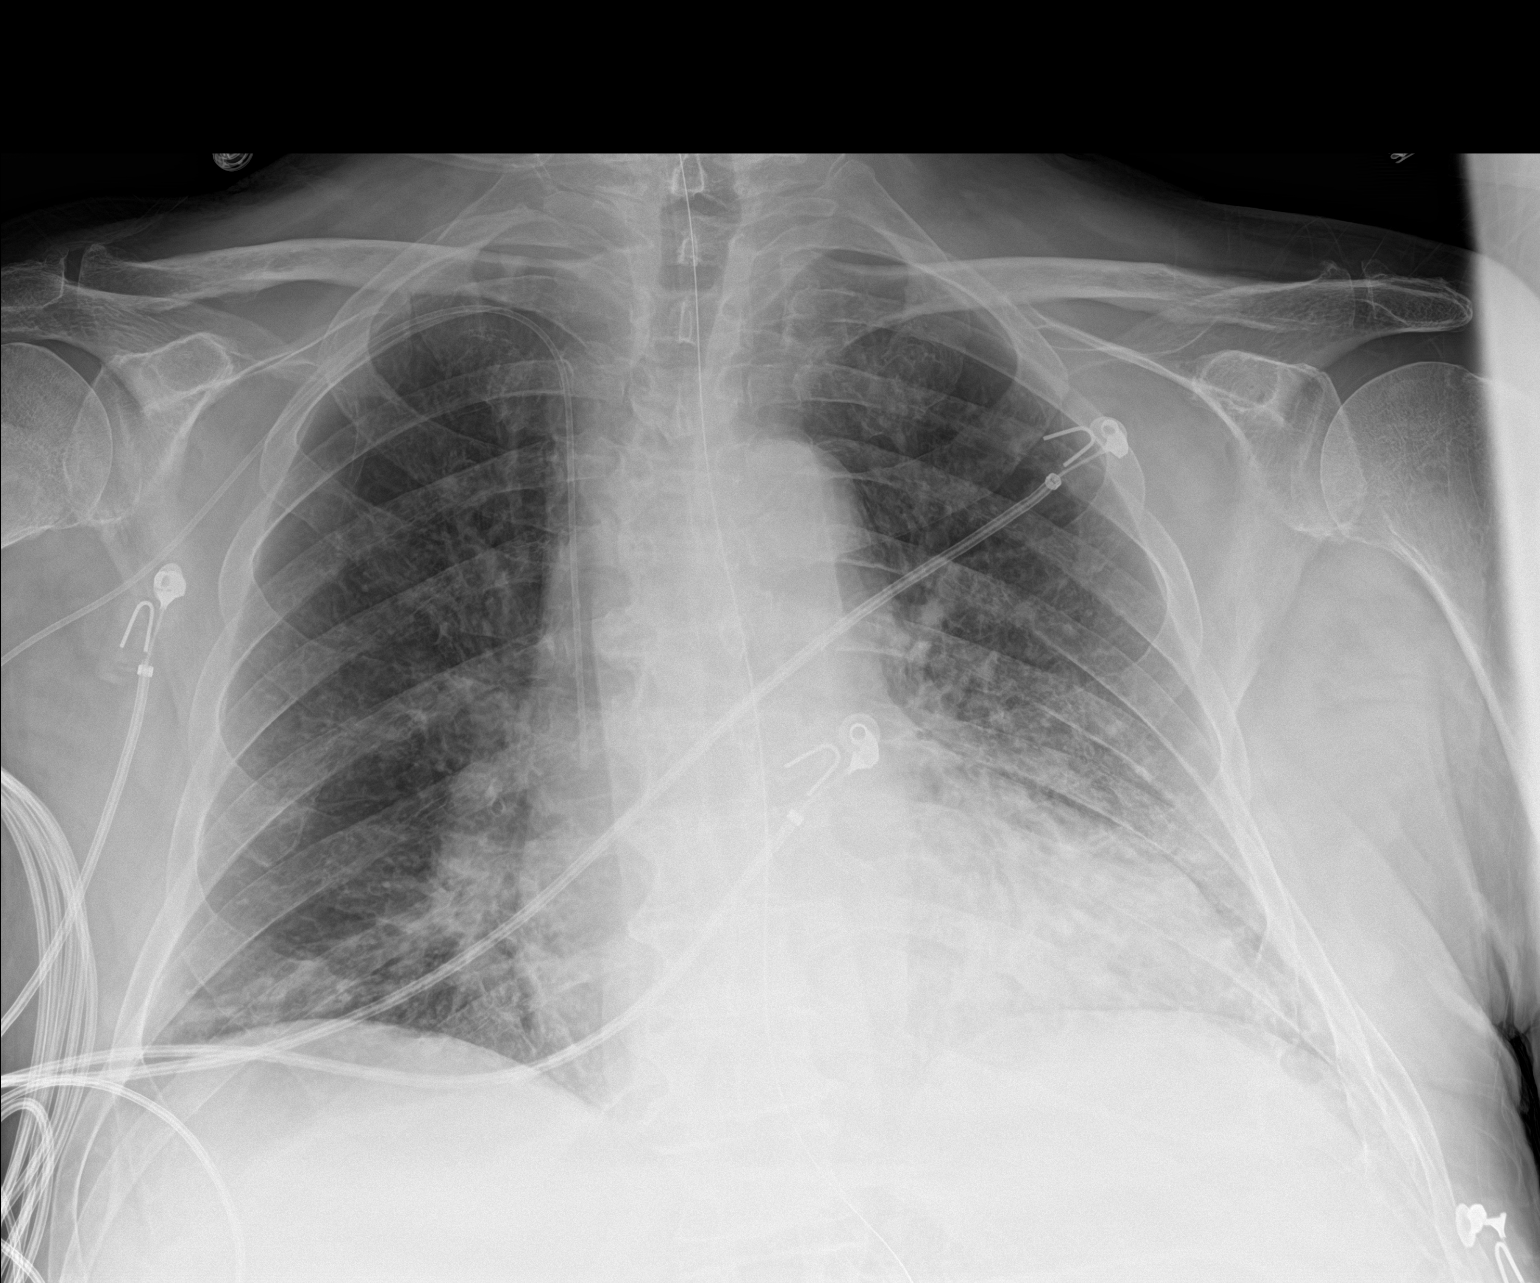

[1 of 1 positions shown; findings below may reference images not displayed]

FINDINGS: The cardiomediastinal silhouette is unchanged.

An NG tube entering the stomach and RIGHT PICC line with tip
overlying the SUPERIOR cavoatrial junction again noted.

Mid-lower LEFT lung airspace opacities are unchanged.

Mild RIGHT basilar opacities/atelectasis again noted.

No definite pleural effusion or pneumothorax noted.

No significant changes identified.
IMPRESSION: Little interval change with continued LEFT lung airspace opacities
and mild RIGHT basilar opacity/atelectasis.

## 2018-11-26 IMAGING — DX DG CHEST 1V PORT
1 series · 1 of 1 positions shown · non-contrast
Comparison: 05/11/2017 and prior radiographs

CLINICAL DATA: Altered mental status.

EXAM:
PORTABLE CHEST 1 VIEW

[chest]
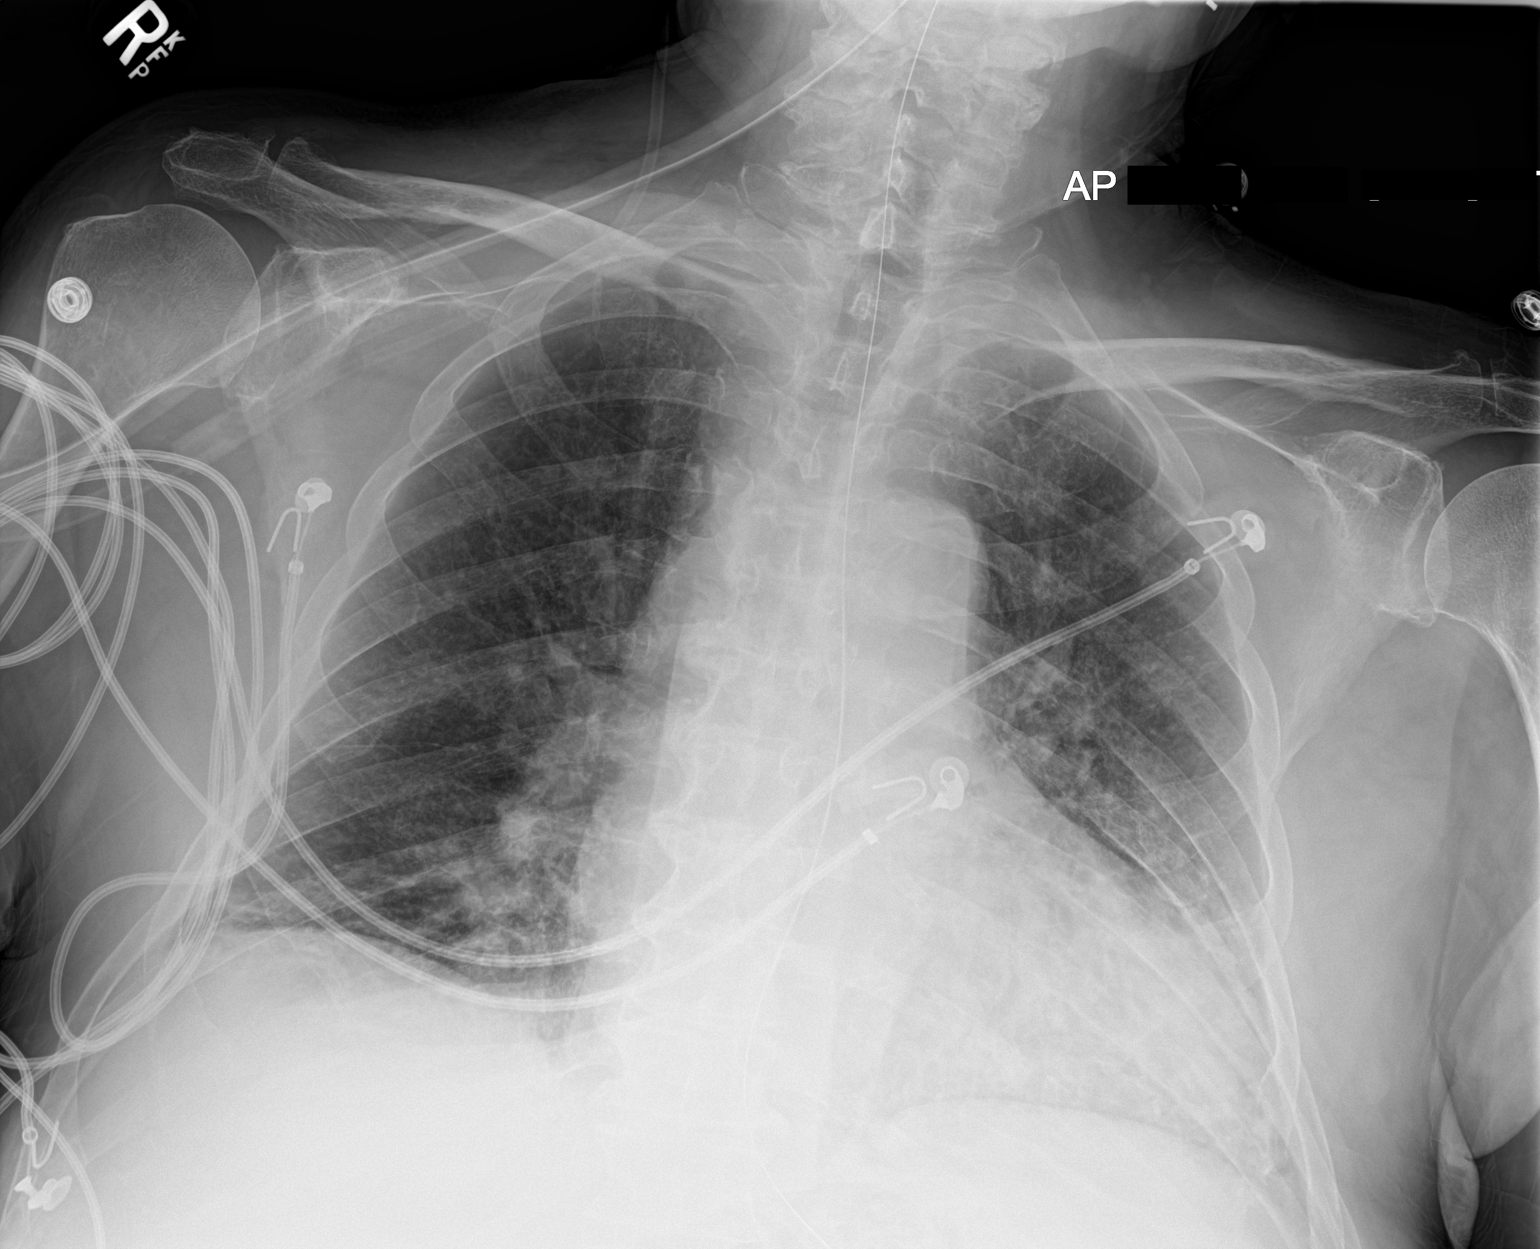

[1 of 1 positions shown; findings below may reference images not displayed]

FINDINGS: UPPER limits normal heart size again noted.

An NG tube is again identified entering the stomach with tip off the
field of view.

A RIGHT PICC line has been removed.

Bilateral LOWER lung opacities, LEFT-greater-than-RIGHT, are
unchanged.

There is no evidence of pneumothorax.
IMPRESSION: RIGHT PICC line removal, otherwise little interval change with
continued bilateral LOWER lung opacities, LEFT-greater-than-RIGHT.

## 2018-12-02 IMAGING — XA IR PERC PLACEMENT GASTROSTOMY
1 series · 1 of 1 positions shown · non-contrast
Comparison: none

CLINICAL DATA: Previous stroke, protein calorie malnutrition, needs
enteral feeding support

EXAM:
PERC PLACEMENT GASTROSTOMY
FLUOROSCOPY TIME:  4 minutes; 329  [id number] DAP
TECHNIQUE: The procedure, risks, benefits, and alternatives were explained to
the patient. Questions regarding the procedure were encouraged and
answered. The patient understands and consents to the procedure. As
antibiotic prophylaxis, cefazolin 2 g was ordered pre-procedure and
administered intravenously within one hour of incision.

[Series 1: fl (-) angio · 1 of 1 slices shown]
[im 1/1]
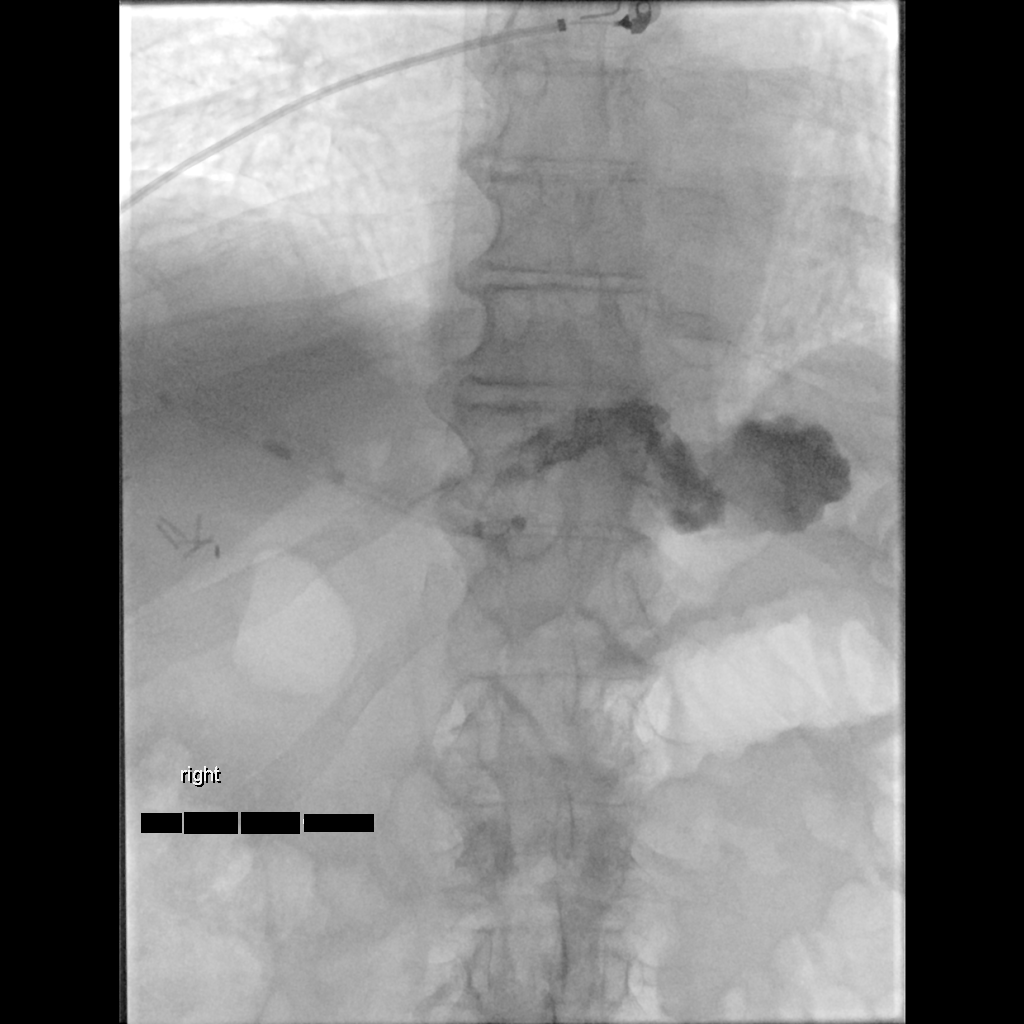

[1 of 1 positions shown; findings below may reference images not displayed]

A safe percutaneous approach was confirmed on recent CT.

A 5 French angiographic catheter was placed as orogastric tube. The
upper abdomen was prepped with Betadine, draped in usual sterile
fashion, and infiltrated locally with 1% lidocaine.

Intravenous Fentanyl and Versed were administered as conscious
sedation during continuous monitoring of the patient's level of
consciousness and physiological / cardiorespiratory status by the
radiology RN, with a total moderate sedation time of 10 minutes.
mg glucagon given IV to facilitate gastric distention.

Stomach was insufflated using air through the orogastric tube. An 18
French sheath needle was advanced percutaneously into the gastric
lumen under fluoroscopy. Gas could be aspirated and a small contrast
injection confirmed intraluminal spread. The sheath was exchanged
over a guidewire for a 9 French vascular sheath, through which the
snare device was advanced and used to snare a guidewire passed
through the orogastric tube. This was withdrawn, and the snare
attached to the 20 French pull-through gastrostomy tube, which was
advanced antegrade, positioned with the internal bumper securing the
anterior gastric wall to the anterior abdominal wall. Small contrast
injection confirms appropriate positioning. The external bumper was
applied and the catheter was flushed.

COMPLICATIONS:
COMPLICATIONS
none
IMPRESSION: 1. Technically successful 20 French pull-through gastrostomy
placement under fluoroscopy.
